# Patient Record
Sex: Female | Born: 1960 | Race: Black or African American | Hispanic: No | Marital: Single | Smoking: Never smoker
Health system: Southern US, Community
[De-identification: ages and names within clinical notes are randomized; demographics above are authoritative.]

## PROBLEM LIST (undated history)

## (undated) DIAGNOSIS — J45909 Unspecified asthma, uncomplicated: Secondary | ICD-10-CM

## (undated) DIAGNOSIS — E0500 Thyrotoxicosis with diffuse goiter without thyrotoxic crisis or storm: Secondary | ICD-10-CM

## (undated) DIAGNOSIS — I10 Essential (primary) hypertension: Secondary | ICD-10-CM

## (undated) DIAGNOSIS — M1990 Unspecified osteoarthritis, unspecified site: Secondary | ICD-10-CM

## (undated) HISTORY — PX: CARDIAC CATHETERIZATION: SHX172

---

## 1989-01-24 HISTORY — PX: ABDOMINAL HYSTERECTOMY: SHX81

## 2010-01-24 HISTORY — PX: GASTRIC BYPASS: SHX52

## 2011-12-19 DIAGNOSIS — J309 Allergic rhinitis, unspecified: Secondary | ICD-10-CM | POA: Insufficient documentation

## 2012-10-03 DIAGNOSIS — I10 Essential (primary) hypertension: Secondary | ICD-10-CM | POA: Insufficient documentation

## 2019-10-08 DIAGNOSIS — E0500 Thyrotoxicosis with diffuse goiter without thyrotoxic crisis or storm: Secondary | ICD-10-CM | POA: Insufficient documentation

## 2020-01-25 HISTORY — PX: KNEE ARTHROPLASTY: SHX992

## 2020-08-06 DIAGNOSIS — S92353A Displaced fracture of fifth metatarsal bone, unspecified foot, initial encounter for closed fracture: Secondary | ICD-10-CM | POA: Insufficient documentation

## 2021-01-24 DIAGNOSIS — I639 Cerebral infarction, unspecified: Secondary | ICD-10-CM

## 2021-01-24 HISTORY — DX: Cerebral infarction, unspecified: I63.9

## 2021-06-02 DIAGNOSIS — Z8679 Personal history of other diseases of the circulatory system: Secondary | ICD-10-CM | POA: Insufficient documentation

## 2021-06-20 ENCOUNTER — Encounter (HOSPITAL_COMMUNITY): Payer: Self-pay | Admitting: Neurosurgery

## 2021-06-20 ENCOUNTER — Emergency Department (HOSPITAL_COMMUNITY): Payer: BC Managed Care – PPO

## 2021-06-20 ENCOUNTER — Inpatient Hospital Stay (HOSPITAL_COMMUNITY)
Admission: EM | Admit: 2021-06-20 | Discharge: 2021-07-16 | DRG: 020 | Disposition: A | Discharge status: Skilled Nursing Facility | Payer: BC Managed Care – PPO | Attending: Neurosurgery | Admitting: Neurosurgery

## 2021-06-20 ENCOUNTER — Inpatient Hospital Stay (HOSPITAL_COMMUNITY): Payer: BC Managed Care – PPO

## 2021-06-20 ENCOUNTER — Other Ambulatory Visit: Payer: Self-pay

## 2021-06-20 DIAGNOSIS — E870 Hyperosmolality and hypernatremia: Secondary | ICD-10-CM | POA: Diagnosis present

## 2021-06-20 DIAGNOSIS — Z9884 Bariatric surgery status: Secondary | ICD-10-CM

## 2021-06-20 DIAGNOSIS — E876 Hypokalemia: Secondary | ICD-10-CM | POA: Diagnosis not present

## 2021-06-20 DIAGNOSIS — R4701 Aphasia: Secondary | ICD-10-CM | POA: Diagnosis present

## 2021-06-20 DIAGNOSIS — I82432 Acute embolism and thrombosis of left popliteal vein: Secondary | ICD-10-CM | POA: Diagnosis present

## 2021-06-20 DIAGNOSIS — J690 Pneumonitis due to inhalation of food and vomit: Secondary | ICD-10-CM | POA: Diagnosis present

## 2021-06-20 DIAGNOSIS — E669 Obesity, unspecified: Secondary | ICD-10-CM | POA: Diagnosis present

## 2021-06-20 DIAGNOSIS — I671 Cerebral aneurysm, nonruptured: Secondary | ICD-10-CM | POA: Diagnosis present

## 2021-06-20 DIAGNOSIS — I161 Hypertensive emergency: Secondary | ICD-10-CM | POA: Diagnosis present

## 2021-06-20 DIAGNOSIS — R29729 NIHSS score 29: Secondary | ICD-10-CM | POA: Diagnosis not present

## 2021-06-20 DIAGNOSIS — J9600 Acute respiratory failure, unspecified whether with hypoxia or hypercapnia: Secondary | ICD-10-CM | POA: Diagnosis present

## 2021-06-20 DIAGNOSIS — G9340 Encephalopathy, unspecified: Secondary | ICD-10-CM | POA: Diagnosis present

## 2021-06-20 DIAGNOSIS — J9601 Acute respiratory failure with hypoxia: Secondary | ICD-10-CM | POA: Diagnosis present

## 2021-06-20 DIAGNOSIS — I6389 Other cerebral infarction: Secondary | ICD-10-CM | POA: Diagnosis not present

## 2021-06-20 DIAGNOSIS — I82453 Acute embolism and thrombosis of peroneal vein, bilateral: Secondary | ICD-10-CM | POA: Diagnosis present

## 2021-06-20 DIAGNOSIS — I607 Nontraumatic subarachnoid hemorrhage from unspecified intracranial artery: Secondary | ICD-10-CM | POA: Diagnosis not present

## 2021-06-20 DIAGNOSIS — G936 Cerebral edema: Secondary | ICD-10-CM | POA: Diagnosis present

## 2021-06-20 DIAGNOSIS — I6012 Nontraumatic subarachnoid hemorrhage from left middle cerebral artery: Principal | ICD-10-CM | POA: Diagnosis present

## 2021-06-20 DIAGNOSIS — I739 Peripheral vascular disease, unspecified: Secondary | ICD-10-CM | POA: Diagnosis present

## 2021-06-20 DIAGNOSIS — E0590 Thyrotoxicosis, unspecified without thyrotoxic crisis or storm: Secondary | ICD-10-CM | POA: Diagnosis present

## 2021-06-20 DIAGNOSIS — G8191 Hemiplegia, unspecified affecting right dominant side: Secondary | ICD-10-CM | POA: Diagnosis present

## 2021-06-20 DIAGNOSIS — R000 Tachycardia, unspecified: Secondary | ICD-10-CM | POA: Diagnosis not present

## 2021-06-20 DIAGNOSIS — I63512 Cerebral infarction due to unspecified occlusion or stenosis of left middle cerebral artery: Secondary | ICD-10-CM | POA: Diagnosis present

## 2021-06-20 DIAGNOSIS — Z20822 Contact with and (suspected) exposure to covid-19: Secondary | ICD-10-CM | POA: Diagnosis present

## 2021-06-20 DIAGNOSIS — R1310 Dysphagia, unspecified: Secondary | ICD-10-CM | POA: Diagnosis present

## 2021-06-20 DIAGNOSIS — Z923 Personal history of irradiation: Secondary | ICD-10-CM

## 2021-06-20 DIAGNOSIS — I82442 Acute embolism and thrombosis of left tibial vein: Secondary | ICD-10-CM | POA: Diagnosis present

## 2021-06-20 DIAGNOSIS — I10 Essential (primary) hypertension: Secondary | ICD-10-CM | POA: Diagnosis present

## 2021-06-20 DIAGNOSIS — I609 Nontraumatic subarachnoid hemorrhage, unspecified: Secondary | ICD-10-CM | POA: Diagnosis not present

## 2021-06-20 DIAGNOSIS — I82412 Acute embolism and thrombosis of left femoral vein: Secondary | ICD-10-CM | POA: Diagnosis present

## 2021-06-20 DIAGNOSIS — Z781 Physical restraint status: Secondary | ICD-10-CM

## 2021-06-20 DIAGNOSIS — I82403 Acute embolism and thrombosis of unspecified deep veins of lower extremity, bilateral: Secondary | ICD-10-CM

## 2021-06-20 DIAGNOSIS — E785 Hyperlipidemia, unspecified: Secondary | ICD-10-CM | POA: Diagnosis present

## 2021-06-20 DIAGNOSIS — Z8679 Personal history of other diseases of the circulatory system: Secondary | ICD-10-CM

## 2021-06-20 DIAGNOSIS — I71019 Dissection of thoracic aorta, unspecified: Secondary | ICD-10-CM | POA: Diagnosis present

## 2021-06-20 DIAGNOSIS — G40901 Epilepsy, unspecified, not intractable, with status epilepticus: Secondary | ICD-10-CM | POA: Diagnosis present

## 2021-06-20 DIAGNOSIS — R509 Fever, unspecified: Secondary | ICD-10-CM | POA: Diagnosis not present

## 2021-06-20 DIAGNOSIS — I608 Other nontraumatic subarachnoid hemorrhage: Principal | ICD-10-CM

## 2021-06-20 DIAGNOSIS — G935 Compression of brain: Secondary | ICD-10-CM | POA: Diagnosis present

## 2021-06-20 DIAGNOSIS — I7100 Dissection of unspecified site of aorta: Secondary | ICD-10-CM | POA: Diagnosis present

## 2021-06-20 DIAGNOSIS — B370 Candidal stomatitis: Secondary | ICD-10-CM | POA: Diagnosis not present

## 2021-06-20 DIAGNOSIS — Z6839 Body mass index (BMI) 39.0-39.9, adult: Secondary | ICD-10-CM

## 2021-06-20 DIAGNOSIS — J45909 Unspecified asthma, uncomplicated: Secondary | ICD-10-CM | POA: Diagnosis present

## 2021-06-20 DIAGNOSIS — S066XAA Traumatic subarachnoid hemorrhage with loss of consciousness status unknown, initial encounter: Secondary | ICD-10-CM | POA: Insufficient documentation

## 2021-06-20 LAB — DIFFERENTIAL
Abs Immature Granulocytes: 0.06 10*3/uL (ref 0.00–0.07)
Basophils Absolute: 0 10*3/uL (ref 0.0–0.1)
Basophils Relative: 0 %
Eosinophils Absolute: 0.3 10*3/uL (ref 0.0–0.5)
Eosinophils Relative: 3 %
Immature Granulocytes: 1 %
Lymphocytes Relative: 32 %
Lymphs Abs: 3.6 10*3/uL (ref 0.7–4.0)
Monocytes Absolute: 1 10*3/uL (ref 0.1–1.0)
Monocytes Relative: 9 %
Neutro Abs: 6.2 10*3/uL (ref 1.7–7.7)
Neutrophils Relative %: 55 %

## 2021-06-20 LAB — I-STAT ARTERIAL BLOOD GAS, ED
Acid-base deficit: 2 mmol/L (ref 0.0–2.0)
Bicarbonate: 24 mmol/L (ref 20.0–28.0)
Calcium, Ion: 1.22 mmol/L (ref 1.15–1.40)
HCT: 34 % — ABNORMAL LOW (ref 36.0–46.0)
Hemoglobin: 11.6 g/dL — ABNORMAL LOW (ref 12.0–15.0)
O2 Saturation: 99 %
Patient temperature: 96.8
Potassium: 2.8 mmol/L — ABNORMAL LOW (ref 3.5–5.1)
Sodium: 146 mmol/L — ABNORMAL HIGH (ref 135–145)
TCO2: 25 mmol/L (ref 22–32)
pCO2 arterial: 43.6 mmHg (ref 32–48)
pH, Arterial: 7.344 — ABNORMAL LOW (ref 7.35–7.45)
pO2, Arterial: 141 mmHg — ABNORMAL HIGH (ref 83–108)

## 2021-06-20 LAB — COMPREHENSIVE METABOLIC PANEL
ALT: 15 U/L (ref 0–44)
AST: 24 U/L (ref 15–41)
Albumin: 3.2 g/dL — ABNORMAL LOW (ref 3.5–5.0)
Alkaline Phosphatase: 94 U/L (ref 38–126)
Anion gap: 10 (ref 5–15)
BUN: 17 mg/dL (ref 6–20)
CO2: 22 mmol/L (ref 22–32)
Calcium: 9.1 mg/dL (ref 8.9–10.3)
Chloride: 109 mmol/L (ref 98–111)
Creatinine, Ser: 0.82 mg/dL (ref 0.44–1.00)
GFR, Estimated: >60 mL/min (ref >60)
Glucose, Bld: 189 mg/dL — ABNORMAL HIGH (ref 70–99)
Potassium: 4.1 mmol/L (ref 3.5–5.1)
Sodium: 141 mmol/L (ref 135–145)
Total Bilirubin: 0.3 mg/dL (ref 0.3–1.2)
Total Protein: 6.3 g/dL — ABNORMAL LOW (ref 6.5–8.1)

## 2021-06-20 LAB — I-STAT BETA HCG BLOOD, ED (MC, WL, AP ONLY): I-stat hCG, quantitative: <5 m[IU]/mL (ref <5)

## 2021-06-20 LAB — SODIUM
Sodium: 142 mmol/L (ref 135–145)
Sodium: 144 mmol/L (ref 135–145)

## 2021-06-20 LAB — RAPID URINE DRUG SCREEN, HOSP PERFORMED
Amphetamines: NOT DETECTED
Barbiturates: NOT DETECTED
Benzodiazepines: NOT DETECTED
Cocaine: NOT DETECTED
Opiates: NOT DETECTED
Tetrahydrocannabinol: NOT DETECTED

## 2021-06-20 LAB — CBC
HCT: 36.9 % (ref 36.0–46.0)
Hemoglobin: 11.6 g/dL — ABNORMAL LOW (ref 12.0–15.0)
MCH: 28.7 pg (ref 26.0–34.0)
MCHC: 31.4 g/dL (ref 30.0–36.0)
MCV: 91.3 fL (ref 80.0–100.0)
Platelets: 256 10*3/uL (ref 150–400)
RBC: 4.04 MIL/uL (ref 3.87–5.11)
RDW: 12.9 % (ref 11.5–15.5)
WBC: 11.1 10*3/uL — ABNORMAL HIGH (ref 4.0–10.5)
nRBC: 0 % (ref 0.0–0.2)

## 2021-06-20 LAB — APTT
aPTT: 24 seconds (ref 24–36)
aPTT: 26 seconds (ref 24–36)

## 2021-06-20 LAB — PROTIME-INR
INR: 1.2 (ref 0.8–1.2)
INR: 1.3 — ABNORMAL HIGH (ref 0.8–1.2)
Prothrombin Time: 15 seconds (ref 11.4–15.2)
Prothrombin Time: 15.6 seconds — ABNORMAL HIGH (ref 11.4–15.2)

## 2021-06-20 LAB — I-STAT CHEM 8, ED
BUN: 19 mg/dL (ref 6–20)
Calcium, Ion: 1.16 mmol/L (ref 1.15–1.40)
Chloride: 106 mmol/L (ref 98–111)
Creatinine, Ser: 0.7 mg/dL (ref 0.44–1.00)
Glucose, Bld: 184 mg/dL — ABNORMAL HIGH (ref 70–99)
HCT: 37 % (ref 36.0–46.0)
Hemoglobin: 12.6 g/dL (ref 12.0–15.0)
Potassium: 3.9 mmol/L (ref 3.5–5.1)
Sodium: 141 mmol/L (ref 135–145)
TCO2: 24 mmol/L (ref 22–32)

## 2021-06-20 LAB — MAGNESIUM: Magnesium: 1.6 mg/dL — ABNORMAL LOW (ref 1.7–2.4)

## 2021-06-20 LAB — CBG MONITORING, ED: Glucose-Capillary: 162 mg/dL — ABNORMAL HIGH (ref 70–99)

## 2021-06-20 LAB — LACTIC ACID, PLASMA
Lactic Acid, Venous: 2.5 mmol/L (ref 0.5–1.9)
Lactic Acid, Venous: 2.4 mmol/L (ref 0.5–1.9)

## 2021-06-20 LAB — SARS CORONAVIRUS 2 BY RT PCR: SARS Coronavirus 2 by RT PCR: NEGATIVE

## 2021-06-20 LAB — MRSA NEXT GEN BY PCR, NASAL: MRSA by PCR Next Gen: NOT DETECTED

## 2021-06-20 MED ORDER — MAGNESIUM SULFATE 2 GM/50ML IV SOLN
2.0000 g | Freq: Once | INTRAVENOUS | Status: AC
  Administered 2021-06-20: 2 g via INTRAVENOUS
  Filled 2021-06-20: qty 50

## 2021-06-20 MED ORDER — ACETAMINOPHEN 160 MG/5ML PO SOLN
650.0000 mg | ORAL | Status: DC | PRN
  Administered 2021-06-21 – 2021-06-29 (×23): 650 mg
  Filled 2021-06-20 (×22): qty 20.3

## 2021-06-20 MED ORDER — CHLORHEXIDINE GLUCONATE CLOTH 2 % EX PADS
6.0000 | MEDICATED_PAD | Freq: Every day | CUTANEOUS | Status: DC
  Administered 2021-06-23 – 2021-07-06 (×14): 6 via TOPICAL

## 2021-06-20 MED ORDER — ONDANSETRON HCL 4 MG/2ML IJ SOLN
4.0000 mg | Freq: Four times a day (QID) | INTRAMUSCULAR | Status: DC | PRN
Start: 2021-06-20 — End: 2021-07-16

## 2021-06-20 MED ORDER — NIMODIPINE 6 MG/ML PO SOLN
60.0000 mg | ORAL | Status: AC
  Administered 2021-06-20 – 2021-07-11 (×113): 60 mg
  Filled 2021-06-20 (×126): qty 10

## 2021-06-20 MED ORDER — POLYETHYLENE GLYCOL 3350 17 G PO PACK: 17.0000 g | PACK | Freq: Every day | ORAL | Status: DC | PRN

## 2021-06-20 MED ORDER — PANTOPRAZOLE SODIUM 40 MG IV SOLR
40.0000 mg | Freq: Every day | INTRAVENOUS | Status: DC
  Administered 2021-06-20: 40 mg via INTRAVENOUS
  Filled 2021-06-20: qty 10

## 2021-06-20 MED ORDER — POLYETHYLENE GLYCOL 3350 17 G PO PACK
17.0000 g | PACK | Freq: Every day | ORAL | Status: DC
  Administered 2021-06-22: 17 g
  Filled 2021-06-20: qty 1

## 2021-06-20 MED ORDER — SUCCINYLCHOLINE CHLORIDE 20 MG/ML IJ SOLN
INTRAMUSCULAR | Status: AC | PRN
  Administered 2021-06-20: 150 mg via INTRAVENOUS

## 2021-06-20 MED ORDER — SODIUM CHLORIDE 3 % IV SOLN
INTRAVENOUS | Status: DC
  Filled 2021-06-20 (×5): qty 500

## 2021-06-20 MED ORDER — SODIUM CHLORIDE 3 % IV BOLUS
500.0000 mL | Freq: Once | INTRAVENOUS | Status: AC
  Administered 2021-06-20: 500 mL via INTRAVENOUS
  Filled 2021-06-20: qty 500

## 2021-06-20 MED ORDER — FENTANYL CITRATE PF 50 MCG/ML IJ SOSY
50.0000 ug | PREFILLED_SYRINGE | INTRAMUSCULAR | Status: DC | PRN
  Administered 2021-06-21 – 2021-06-22 (×2): 50 ug via INTRAVENOUS
  Filled 2021-06-20 (×2): qty 1

## 2021-06-20 MED ORDER — LEVETIRACETAM IN NACL 1000 MG/100ML IV SOLN
1000.0000 mg | Freq: Once | INTRAVENOUS | Status: AC
  Administered 2021-06-20: 1000 mg via INTRAVENOUS

## 2021-06-20 MED ORDER — FENTANYL CITRATE PF 50 MCG/ML IJ SOSY: 50.0000 ug | PREFILLED_SYRINGE | INTRAMUSCULAR | Status: DC | PRN

## 2021-06-20 MED ORDER — HYDROMORPHONE HCL 1 MG/ML IJ SOLN: 0.5000 mg | INTRAMUSCULAR | Status: DC | PRN

## 2021-06-20 MED ORDER — ETOMIDATE 2 MG/ML IV SOLN
INTRAVENOUS | Status: AC | PRN
  Administered 2021-06-20: 20 mg via INTRAVENOUS

## 2021-06-20 MED ORDER — DOCUSATE SODIUM 100 MG PO CAPS: 100.0000 mg | ORAL_CAPSULE | Freq: Two times a day (BID) | ORAL | Status: DC | PRN

## 2021-06-20 MED ORDER — LEVETIRACETAM IN NACL 500 MG/100ML IV SOLN
500.0000 mg | Freq: Two times a day (BID) | INTRAVENOUS | Status: DC
  Administered 2021-06-20 – 2021-06-28 (×16): 500 mg via INTRAVENOUS
  Filled 2021-06-20 (×16): qty 100

## 2021-06-20 MED ORDER — ONDANSETRON 4 MG PO TBDP: 4.0000 mg | ORAL_TABLET | Freq: Four times a day (QID) | ORAL | Status: DC | PRN

## 2021-06-20 MED ORDER — ACETAMINOPHEN 650 MG RE SUPP: 650.0000 mg | RECTAL | Status: DC | PRN

## 2021-06-20 MED ORDER — DOCUSATE SODIUM 50 MG/5ML PO LIQD
100.0000 mg | Freq: Two times a day (BID) | ORAL | Status: DC
  Administered 2021-06-20 – 2021-06-22 (×3): 100 mg
  Filled 2021-06-20 (×3): qty 10

## 2021-06-20 MED ORDER — SODIUM CHLORIDE 0.9 % IV SOLN
4000.0000 mg | Freq: Once | INTRAVENOUS | Status: DC
  Filled 2021-06-20: qty 40

## 2021-06-20 MED ORDER — IOHEXOL 350 MG/ML SOLN
100.0000 mL | Freq: Once | INTRAVENOUS | Status: AC | PRN
  Administered 2021-06-20: 75 mL via INTRAVENOUS

## 2021-06-20 MED ORDER — NIMODIPINE 30 MG PO CAPS
60.0000 mg | ORAL_CAPSULE | ORAL | Status: AC
  Administered 2021-07-09 – 2021-07-11 (×9): 60 mg via ORAL
  Filled 2021-06-20 (×20): qty 2

## 2021-06-20 MED ORDER — PANTOPRAZOLE SODIUM 40 MG PO TBEC: 40.0000 mg | DELAYED_RELEASE_TABLET | Freq: Every day | ORAL | Status: DC

## 2021-06-20 MED ORDER — PANTOPRAZOLE 2 MG/ML SUSPENSION: 40.0000 mg | Freq: Every day | ORAL | Status: DC

## 2021-06-20 MED ORDER — SODIUM CHLORIDE 0.9% FLUSH
3.0000 mL | Freq: Once | INTRAVENOUS | Status: AC
  Administered 2021-06-20: 3 mL via INTRAVENOUS

## 2021-06-20 MED ORDER — PROPOFOL 1000 MG/100ML IV EMUL
5.0000 ug/kg/min | INTRAVENOUS | Status: DC
  Administered 2021-06-20: 10 ug/kg/min via INTRAVENOUS

## 2021-06-20 MED ORDER — ACETAMINOPHEN 325 MG PO TABS
650.0000 mg | ORAL_TABLET | ORAL | Status: DC | PRN
  Administered 2021-06-26: 650 mg via ORAL
  Filled 2021-06-20: qty 2

## 2021-06-20 MED ORDER — LABETALOL HCL 5 MG/ML IV SOLN: 20.0000 mg | Freq: Once | INTRAVENOUS | Status: DC

## 2021-06-20 MED ORDER — SODIUM CHLORIDE 0.9 % IV BOLUS
1000.0000 mL | Freq: Once | INTRAVENOUS | Status: AC
  Administered 2021-06-20: 1000 mL via INTRAVENOUS

## 2021-06-20 MED ORDER — SODIUM CHLORIDE 0.9 % IV SOLN: INTRAVENOUS | Status: DC

## 2021-06-20 MED ORDER — CLEVIDIPINE BUTYRATE 0.5 MG/ML IV EMUL: 0.0000 mg/h | INTRAVENOUS | Status: DC

## 2021-06-20 MED ORDER — SODIUM CHLORIDE 0.9 % IV SOLN
3.0000 g | Freq: Four times a day (QID) | INTRAVENOUS | Status: AC
  Administered 2021-06-20 – 2021-06-25 (×20): 3 g via INTRAVENOUS
  Filled 2021-06-20 (×22): qty 8

## 2021-06-20 MED ORDER — MORPHINE SULFATE (PF) 2 MG/ML IV SOLN: 0.5000 mg | INTRAVENOUS | Status: DC | PRN

## 2021-06-20 MED ORDER — ALBUTEROL SULFATE (2.5 MG/3ML) 0.083% IN NEBU: 2.5000 mg | INHALATION_SOLUTION | Freq: Four times a day (QID) | RESPIRATORY_TRACT | Status: DC | PRN

## 2021-06-20 MED ORDER — PROPOFOL 1000 MG/100ML IV EMUL
0.0000 ug/kg/min | INTRAVENOUS | Status: DC
  Administered 2021-06-20 – 2021-06-21 (×2): 30 ug/kg/min via INTRAVENOUS
  Administered 2021-06-21: 10 ug/kg/min via INTRAVENOUS
  Administered 2021-06-22: 20 ug/kg/min via INTRAVENOUS
  Administered 2021-06-22: 25 ug/kg/min via INTRAVENOUS
  Filled 2021-06-20 (×5): qty 100

## 2021-06-20 MED ORDER — CLEVIDIPINE BUTYRATE 0.5 MG/ML IV EMUL
0.0000 mg/h | INTRAVENOUS | Status: DC
  Administered 2021-06-20: 2 mg/h via INTRAVENOUS

## 2021-06-20 MED ORDER — STROKE: EARLY STAGES OF RECOVERY BOOK
Freq: Once | Status: AC
  Filled 2021-06-20: qty 1

## 2021-06-20 MED ORDER — HEPARIN SODIUM (PORCINE) 5000 UNIT/ML IJ SOLN: 5000.0000 [IU] | Freq: Three times a day (TID) | INTRAMUSCULAR | Status: DC

## 2021-06-20 MED ORDER — PANTOPRAZOLE 2 MG/ML SUSPENSION
40.0000 mg | Freq: Every day | ORAL | Status: DC
  Administered 2021-06-20 – 2021-06-28 (×9): 40 mg
  Filled 2021-06-20 (×9): qty 20

## 2021-06-20 NOTE — Progress Notes (Signed)
Received pt from the ED, intubated, accompanied by daughter, no personal belongings at bedside, all questions answered t  06/20/21 2130  Vitals  BP (!) 121/96  MAP (mmHg) 105  ECG Heart Rate 75  Resp 11  MEWS Score  MEWS Temp 0  MEWS Systolic 0  MEWS Pulse 0  MEWS RR 1  MEWS LOC 3  MEWS Score 4  MEWS Score Color Red   o the daughter

## 2021-06-20 NOTE — Progress Notes (Signed)
RT pulled back et tube 2.5 cm to 21 cm @ lip. Equal BLBS and return volumes on vent. Pulled back per MD request and cxr results.

## 2021-06-20 NOTE — Code Documentation (Signed)
Stroke Response Nurse Documentation Code Documentation  Yolanda Boone is a 61 y.o. female arriving to [facility]  via [facility] on 06/20/21 with past medical hx of HTN, graves, type b aneurysm, asthma. On No antithrombotic. Code stroke was activated by EMS.   Patient from home where she was LKW at 1414 and now complaining of unresponsive, seizing, and posturing .   Stroke team at the bedside on patient arrival. Labs drawn and patient cleared for CT by Dr. Regenia Skeeter. Patient to CT with team. NIHSS 31, see documentation for details and code stroke times. The following imaging was completed:  CT Head and CTA. Patient is not a candidate for IV Thrombolytic due to head bleed. Patient started on hypertonic IVF, Keppra, and cleviprex for BP management  Bedside handoff with ED RN Curt Bears.    Prentiss  Rapid Response RN

## 2021-06-20 NOTE — Consult Note (Signed)
Neurology Consult H&P  Nandika Stetzer MR# [id number] 06/20/2021   CC: Code stroke  History is obtained from: EMS and chart.  HPI: Yolanda Boone is a 61 y.o. female PMHx as reviewed below found down by family approximately 1615.  Patient was unresponsive between the nightstand in the bed EMS arrived and noted patient had left gaze deviation as well as right arm flexion and left arm extension on arrival GCS 3.    Blood pressure was last 160/130   LKW: Unknown tNK given: No not indicated IR Thrombectomy No, not indicated Modified Rankin Scale: 0-Completely asymptomatic and back to baseline post- stroke NIHSS: 29  ROS: Unable to assess due to encephalopathy, intubated with paralytic.  No past medical history on file.   No family history on file.  Social History:  has no history on file for tobacco use, alcohol use, and drug use.   Prior to Admission medications   Not on File    Exam: Current vital signs: BP (!) 128/94   Pulse (!) 101   Temp (!) 95.7 F (35.4 C) (Temporal)   Resp (!) 24   Ht 5\' 7"  (1.702 m)   Wt 95 kg   SpO2 100%   BMI 32.80 kg/m   Physical Exam  Constitutional: Appears well-developed and well-nourished.  Psych: Unable to assess due to body encephalopathy, intubation with paralytic agent Eyes: No scleral injection HENT: No OP obstruction. Head: Normocephalic.  Cardiovascular: Normal rate and regular rhythm.  Respiratory: Effort normal, symmetric excursions bilaterally, no audible wheezing. GI: Soft.  No distension. There is no tenderness.  Skin: WDI  Neuro: Mental Status: Unable to assess as the patient was intubated and paralyzed with paralytic agent.  I have reviewed labs in epic and the pertinent results are: CBG 162  GCS 3 ICH 2 52mm rightward shift   I have reviewed the images obtained: NCT head showed Large volume of acute subarachnoid hemorrhage, described above and greatest in the left sylvian fissure with possible rounded  filling defect in the region of the left MCA bifurcation. Recommend emergent CTA to assess for ruptured aneurysm (possibly left MCA bifurcation) resulting mass effect with approximately 4 mm of rightward midline shift. CTA head and neck showed Small/diminutive M1 MCAs and ACAs bilaterally, which are patent. irregular/lobulated 5 x 2 mm outpouching arising from the left MCA bifurcation, compatible with ruptured aneurysm. 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   *Possible outpouching of the imaged distal aortic arch and proximal descending aorta with linear filling defect. This is indeterminate and incompletely imaged, but may represent chronic, fenestrated dissection or aneurysm at the origin of an aberrant subclavian artery. Recommend follow-up CTA of the chest to further evaluate.    Assessment: Yolanda Boone is a 61 y.o. female unclear medical history as she has not received much of her care at [facility] health arrives in status epilepticus found to have ruptured aneurysm at the left M1 bifurcation large volume subarachnoid hemorrhage.   Impression:  Unruptured left M1 aneurysm Subarachnoid hemorrhage Hypertensive emergency Status epilepticus Comatose  Plan: Please consult neurosurgery for admission and further management. Stat labs 100 cc bolus of 3% NaCl Stat 75 cc/h NaCl infusion Elevate head of bed 30 to 45 degrees Start Cleviprex drip with as needed labetalol 10 mg to maintain SBP between 130 and 150. 4.5 g Keppra loading dose IV. 1500 mg Keppra twice daily maintenance dose. Consider nimodipine 60 mg OG tube every 4 hours for 21 days.  This patient is critically ill and at significant risk of neurological worsening, death and care requires constant monitoring of vital signs, hemodynamics,respiratory and cardiac monitoring, neurological assessment, discussion with family, other specialists and medical decision making of high complexity. I spent  130 minutes of neurocritical care time  in the care of  this patient. This was time spent independent of any time provided by nurse practitioner or PA.  Electronically signed by:  Marisue Humble, MD Page: [phone] 06/20/2021, 7:18 PM  If 7pm- 7am, please page neurology on call as listed in AMION.

## 2021-06-20 NOTE — Progress Notes (Signed)
eLink Physician-Brief Progress Note Patient Name: Yolanda Boone DOB: 12/21/1960 MRN: [id number]   Date of Service  06/20/2021  HPI/Events of Note  60/F with large SAH with midline shift, left MCA with possible ruptured aneurysm, left ICA aneurysm, hypertensive emergency, status epilepticus, respiratory failure.  In the ED, there was improvement in neurologic status with GCS 8.   eICU Interventions  Continue hypertonic saline @ 75cc/hr.  Continue to follow Na with goal 150-155.   NS discontinued.  Continue Keppra. Continue Unasyn empirically. Check procalcitonin. Plan is for cerebral angiogram in the morning.  Keep SBP 130-150.  Titrate cleviprex. Wrist restraints ordered.  Foley cath placement ordered.  SCDs for DVT prophylaxis.  Pantoprazole for GI prophylaxis.      Intervention Category Evaluation Type: New Patient Evaluation  Elsie Lincoln 06/20/2021, 10:45 PM

## 2021-06-20 NOTE — H&P (Cosign Needed)
Chief Complaint: SAH  HPI: Yolanda Boone is a 61 y.o. female who was BIB EMS after being found down by her family earlier today. LKW 1615 hrs. She was reported to be in her usual state of health prior to being found down. No precipitating events were reported. Per EMS, she initially had a left-sided gaze and was posturing. En route patient with 02 sats in the 60s which necessitated the patient being ventilated with a bag mask. On arrival to the ED, a code stroke was activated. The patient was reported to have a GCS of 3 which required emergent intubation. CTH and CTA were obtained and revealed a large SAH with approximately 4 mm of rightward MLS which was concerning for left MCA aneurysm rupture.   History reviewed. No pertinent past medical history.  No family history on file. Social History:  has no history on file for tobacco use, alcohol use, and drug use.  Allergies: No Known Allergies  (Not in a hospital admission)   Results for orders placed or performed during the hospital encounter of 06/20/21 (from the past 48 hour(s))  CBG monitoring, ED     Status: Abnormal   Collection Time: 06/20/21  5:58 PM  Result Value Ref Range   Glucose-Capillary 162 (H) 70 - 99 mg/dL    Comment: Glucose reference range applies only to samples taken after fasting for at least 8 hours.  I-stat chem 8, ED     Status: Abnormal   Collection Time: 06/20/21  6:00 PM  Result Value Ref Range   Sodium 141 135 - 145 mmol/L   Potassium 3.9 3.5 - 5.1 mmol/L   Chloride 106 98 - 111 mmol/L   BUN 19 6 - 20 mg/dL   Creatinine, Ser 5.53 0.44 - 1.00 mg/dL   Glucose, Bld 748 (H) 70 - 99 mg/dL    Comment: Glucose reference range applies only to samples taken after fasting for at least 8 hours.   Calcium, Ion 1.16 1.15 - 1.40 mmol/L   TCO2 24 22 - 32 mmol/L   Hemoglobin 12.6 12.0 - 15.0 g/dL   HCT 27.0 78.6 - 75.4 %  Protime-INR     Status: None   Collection Time: 06/20/21  6:02 PM  Result Value Ref Range    Prothrombin Time 15.0 11.4 - 15.2 seconds   INR 1.2 0.8 - 1.2    Comment: (NOTE) INR goal varies based on device and disease states. Performed at [facility], [address] [place], [place] [address]   APTT     Status: None   Collection Time: 06/20/21  6:02 PM  Result Value Ref Range   aPTT 24 24 - 36 seconds    Comment: Performed at [facility], [address], [place], [place] [address]  CBC     Status: Abnormal   Collection Time: 06/20/21  6:02 PM  Result Value Ref Range   WBC 11.1 (H) 4.0 - 10.5 K/uL   RBC 4.04 3.87 - 5.11 MIL/uL   Hemoglobin 11.6 (L) 12.0 - 15.0 g/dL   HCT 19.7 58.8 - 32.5 %   MCV 91.3 80.0 - 100.0 fL   MCH 28.7 26.0 - 34.0 pg   MCHC 31.4 30.0 - 36.0 g/dL   RDW 49.8 26.4 - 15.8 %   Platelets 256 150 - 400 K/uL   nRBC 0.0 0.0 - 0.2 %    Comment: Performed at [facility], [address] [place], [place] [address]  Differential     Status: None   Collection Time: 06/20/21  6:02 PM  Result Value Ref Range   Neutrophils Relative % 55 %   Neutro Abs 6.2 1.7 - 7.7 K/uL   Lymphocytes Relative 32 %   Lymphs Abs 3.6 0.7 - 4.0 K/uL   Monocytes Relative 9 %   Monocytes Absolute 1.0 0.1 - 1.0 K/uL   Eosinophils Relative 3 %   Eosinophils Absolute 0.3 0.0 - 0.5 K/uL   Basophils Relative 0 %   Basophils Absolute 0.0 0.0 - 0.1 K/uL   Immature Granulocytes 1 %   Abs Immature Granulocytes 0.06 0.00 - 0.07 K/uL    Comment: Performed at [facility], [address], [place], [place] [address]  Comprehensive metabolic panel     Status: Abnormal   Collection Time: 06/20/21  6:02 PM  Result Value Ref Range   Sodium 141 135 - 145 mmol/L   Potassium 4.1 3.5 - 5.1 mmol/L   Chloride 109 98 - 111 mmol/L   CO2 22 22 - 32 mmol/L   Glucose, Bld 189 (H) 70 - 99 mg/dL    Comment: Glucose reference range applies only to samples taken after fasting for at least 8 hours.   BUN 17 6 - 20 mg/dL   Creatinine, Ser 6.04 0.44 - 1.00 mg/dL   Calcium 9.1  8.9 - 54.0 mg/dL   Total Protein 6.3 (L) 6.5 - 8.1 g/dL   Albumin 3.2 (L) 3.5 - 5.0 g/dL   AST 24 15 - 41 U/L   ALT 15 0 - 44 U/L   Alkaline Phosphatase 94 38 - 126 U/L   Total Bilirubin 0.3 0.3 - 1.2 mg/dL   GFR, Estimated >98 >11 mL/min    Comment: (NOTE) Calculated using the CKD-EPI Creatinine Equation (2021)    Anion gap 10 5 - 15    Comment: Performed at [facility], [address], [place], [place] [address]  I-Stat beta hCG blood, ED     Status: None   Collection Time: 06/20/21  6:02 PM  Result Value Ref Range   I-stat hCG, quantitative <5.0 <5 mIU/mL   Comment 3            Comment:   GEST. AGE      CONC.  (mIU/mL)   <=1 WEEK        5 - 50     2 WEEKS       50 - 500     3 WEEKS       100 - 10,000     4 WEEKS     1,000 - 30,000        FEMALE AND NON-PREGNANT FEMALE:     LESS THAN 5 mIU/mL   Lactic acid, plasma     Status: Abnormal   Collection Time: 06/20/21  6:02 PM  Result Value Ref Range   Lactic Acid, Venous 2.5 (HH) 0.5 - 1.9 mmol/L    Comment: CRITICAL RESULT CALLED TO, READ BACK BY AND VERIFIED WITH: GRACE TATE RN.@[email or web address]  ON 5.28.23 BY TCALDWELL MT. Performed at [facility], [address], [place], [place] [address]   Magnesium     Status: Abnormal   Collection Time: 06/20/21  6:04 PM  Result Value Ref Range   Magnesium 1.6 (L) 1.7 - 2.4 mg/dL    Comment: Performed at [facility], [address] [place], [place] [address]  Sodium     Status: None  Collection Time: 06/20/21  6:11 PM  Result Value Ref Range   Sodium 142 135 - 145 mmol/L    Comment: Performed at [facility], [address] [place], [place] [address]  SARS Coronavirus 2 by RT PCR (hospital order, performed in [facility] hospital lab) *cepheid single result test* Anterior Nasal Swab     Status: None   Collection Time: 06/20/21  6:29 PM   Specimen: Anterior Nasal Swab  Result Value Ref Range   SARS Coronavirus 2 by RT PCR NEGATIVE NEGATIVE    Comment:  (NOTE) SARS-CoV-2 target nucleic acids are NOT DETECTED.  The SARS-CoV-2 RNA is generally detectable in upper and lower respiratory specimens during the acute phase of infection. The lowest concentration of SARS-CoV-2 viral copies this assay can detect is 250 copies / mL. A negative result does not preclude SARS-CoV-2 infection and should not be used as the sole basis for treatment or other patient management decisions.  A negative result may occur with improper specimen collection / handling, submission of specimen other than nasopharyngeal swab, presence of viral mutation(s) within the areas targeted by this assay, and inadequate number of viral copies (<250 copies / mL). A negative result must be combined with clinical observations, patient history, and epidemiological information.  Fact Sheet for Patients:   RoadLapTop.co.zahttps://www.fda.gov/media/158405/download  Fact Sheet for Healthcare Providers: http://kim-miller.com/https://www.fda.gov/media/158404/download  This test is not yet approved or  cleared by the [place] FDA and has been authorized for detection and/or diagnosis of SARS-CoV-2 by FDA under an Emergency Use Authorization (EUA).  This EUA will remain in effect (meaning this test can be used) for the duration of the COVID-19 declaration under Section 564(b)(1) of the Act, 21 U.S.C. section 360bbb-3(b)(1), unless the authorization is terminated or revoked sooner.  Performed at [facility], [address] [place], [place] [address]   I-Stat arterial blood gas, ED     Status: Abnormal   Collection Time: 06/20/21  7:22 PM  Result Value Ref Range   pH, Arterial 7.344 (L) 7.35 - 7.45   pCO2 arterial 43.6 32 - 48 mmHg   pO2, Arterial 141 (H) 83 - 108 mmHg   Bicarbonate 24.0 20.0 - 28.0 mmol/L   TCO2 25 22 - 32 mmol/L   O2 Saturation 99 %   Acid-base deficit 2.0 0.0 - 2.0 mmol/L   Sodium 146 (H) 135 - 145 mmol/L   Potassium 2.8 (L) 3.5 - 5.1 mmol/L   Calcium, Ion 1.22 1.15 - 1.40 mmol/L    HCT 34.0 (L) 36.0 - 46.0 %   Hemoglobin 11.6 (L) 12.0 - 15.0 g/dL   Patient temperature 10.296.8 F    Collection site RADIAL, ALLEN'S TEST ACCEPTABLE    Drawn by Operator    Sample type ARTERIAL   Lactic acid, plasma     Status: Abnormal   Collection Time: 06/20/21  8:39 PM  Result Value Ref Range   Lactic Acid, Venous 2.4 (HH) 0.5 - 1.9 mmol/L    Comment: CRITICAL VALUE NOTED.  VALUE IS CONSISTENT WITH PREVIOUSLY REPORTED AND CALLED VALUE. Performed at [facility], [address] [place], [place] [address]    DG Chest Portable 1 View  Result Date: 06/20/2021 CLINICAL DATA:  Intubation EXAM: PORTABLE CHEST 1 VIEW COMPARISON:  None Available. FINDINGS: Endotracheal tube at the carina.  Withdrawal 3 cm is suggested. Possible mild central left upper lobe opacity, equivocal. This could reflect mild aspiration. Lungs are otherwise clear No pleural effusion or  pneumothorax. The heart is normal in size. IMPRESSION: Endotracheal tube at the carina. Withdrawal 3 cm is suggested. Possible mild central left upper lobe opacity, equivocal. This could reflect mild aspiration. Electronically Signed   By: Charline Bills M.D.   On: 06/20/2021 18:48   DG Abd Portable 1 View  Result Date: 06/20/2021 CLINICAL DATA:  OG tube placement. EXAM: PORTABLE ABDOMEN - 1 VIEW COMPARISON:  None Available. FINDINGS: Orogastric tube tip is in the proximal stomach with side hole at the level of the distal esophagus. No dilated bowel loops in the upper abdomen. IMPRESSION: 1. Orogastric tube tip in the proximal stomach with side hole at the level of the distal esophagus. Recommend advancing tube 7 cm. Electronically Signed   By: Darliss Cheney M.D.   On: 06/20/2021 20:45   CT HEAD CODE STROKE WO CONTRAST  Result Date: 06/20/2021 CLINICAL DATA:  Code stroke.  Neuro deficit, acute, stroke suspected EXAM: CT HEAD WITHOUT CONTRAST TECHNIQUE: Contiguous axial images were obtained from the base of the skull through the  vertex without intravenous contrast. RADIATION DOSE REDUCTION: This exam was performed according to the departmental dose-optimization program which includes automated exposure control, adjustment of the mA and/or kV according to patient size and/or use of iterative reconstruction technique. COMPARISON:  None Available. FINDINGS: Brain: Large volume subarachnoid hemorrhage, greatest within the left sylvian fissure. Hemorrhage also extends into the suprasellar cistern, basal cisterns, right sylvian fissure and along the falx. Hemorrhage results in effacement of the left lateral ventricle and approximately 4 mm of rightward midline shift. No evidence of acute large vascular territory infarct, mass lesion, or hydrocephalus. Vascular: Nondiagnostic evaluation for hyperdense vessel due to the hemorrhage. Filling possible defect in the region of the left MCA bifurcation. Skull: No acute fracture. Sinuses/Orbits: Clear visualized sinuses. No acute orbital findings. IMPRESSION: 1. Large volume of acute subarachnoid hemorrhage, described above and greatest in the left sylvian fissure with possible rounded filling defect in the region of the left MCA bifurcation. Recommend emergent CTA to assess for ruptured aneurysm (possibly left MCA bifurcation). 2. Resulting mass effect with approximately 4 mm of rightward midline shift. Findings and recommendations discussed with Dr. Thomasena Edis via telephone at 6:18 PM. Electronically Signed   By: Feliberto Harts M.D.   On: 06/20/2021 18:21   CT ANGIO HEAD NECK W WO CM (CODE STROKE)  Result Date: 06/20/2021 CLINICAL DATA:  Code stroke. EXAM: CT ANGIOGRAPHY HEAD AND NECK TECHNIQUE: Multidetector CT imaging of the head and neck was performed using the standard protocol during bolus administration of intravenous contrast. Multiplanar CT image reconstructions and MIPs were obtained to evaluate the vascular anatomy. Carotid stenosis measurements (when applicable) are obtained utilizing  NASCET criteria, using the distal internal carotid diameter as the denominator. RADIATION DOSE REDUCTION: This exam was performed according to the departmental dose-optimization program which includes automated exposure control, adjustment of the mA and/or kV according to patient size and/or use of iterative reconstruction technique. CONTRAST:  75mL OMNIPAQUE IOHEXOL 350 MG/ML SOLN COMPARISON:  CT head from the same day. FINDINGS: CTA NECK FINDINGS Aortic arch: Great vessel origins are patent. Aberrant origin of the right subclavian artery. Possible outpouching of the imaged distal aortic arch and proximal descending aorta with linear filling defect. Right carotid system: No evidence of dissection, stenosis (50% or greater) or occlusion. Left carotid system: No evidence of dissection, stenosis (50% or greater) or occlusion. Vertebral arteries: Limited visualization due to streak artifact from pooled contrast. The left vertebral artery is dominant. The  right vertebral artery is small throughout its course. No visible high-grade stenosis. Skeleton: No evidence of acute fracture. Other neck: Mild edema in the lower neck about the thyroid. Upper chest: Patchy opacities in the visualized upper lobes bilaterally. Review of the MIP images confirms the above findings CTA HEAD FINDINGS Anterior circulation: Bilateral intracranial ICAs are patent. Small/diminutive M1 MCAs and ACAs bilaterally, which are patent. Irregular/lobulated 5 x 2 mm outpouching arising from the left MCA bifurcation, compatible with ruptured aneurysm. 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 Posterior circulation: Right/non dominant vertebral artery appears to terminate as PICA. Left vertebral artery and basilar artery are patent. Vertebrobasilar system is small/diminutive with bilateral fetal type PCAs, anatomic variant. Bilateral PCAs are patent and small. Venous sinuses: As permitted by contrast timing, patent. Anatomic  variants: Detailed above. Review of the MIP images confirms the above findings IMPRESSION: 1. Irregular/lobulated 5 x 2 mm outpouching arising from the left MCA bifurcation, compatible with aneurysm that is probably ruptured. Recommend urgent neurointerventional consultation. 2. 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 3. No emergent large vessel occlusion. Many of the intracranial arteries are diminutive, which could be congenital and/or related to vasospasm. 4. Possible outpouching of the imaged distal aortic arch and proximal descending aorta with linear filling defect. This is indeterminate and incompletely imaged, but may represent chronic, fenestrated dissection or aneurysm at the origin of an aberrant subclavian artery. Recommend follow-up CTA of the chest to further evaluate. 5. Mild edema in the lower neck about the thyroid. This is nonspecific but could be traumatic related to recent emergent intubation. A follow-up CT of the neck could further evaluate if clinically warranted. 6. Patchy opacities in the upper lobes bilaterally, possibly aspiration. Consider correlation with dedicated chest imaging. Emergent findings discussed with Dr. Thomasena Edis via telephone at 6:40 p.m. Electronically Signed   By: Feliberto Harts M.D.   On: 06/20/2021 19:00    Review of Systems  Unable to perform ROS: Acuity of condition   Blood pressure (!) 122/97, pulse 79, temperature 98 F (36.7 C), temperature source Core, resp. rate 19, height  (1.702 m), weight 95 kg, SpO2 100 %.  Physical Exam Vitals and nursing note reviewed.  Constitutional:      Appearance: She is well-developed. NAD HEENT:     Head: Normocephalic and atraumatic.  Cardiovascular:     Rate and Rhythm: Regular rhythm. Tachycardia present.     Heart sounds: Normal heart sounds.  Pulmonary:     Breath sounds: Normal breath sounds.     Comments: On vent Abdominal:     General: There is no distension.      Palpations: Abdomen is soft.  Skin:    General: Skin is warm and dry.  Neuro: Patient is intubated and on Propofol gtt. In NAD. She is not alert and is unable to follow commands. No spontaneous eye opening. Minimal eye opening to noxious stimuli. Localizes to noxious stimuli in BUE and BLE. PERRL, 2mm bilaterally. GCS 8   Assessment/Plan 61 y.o. female who was fond down unresponsive earlier today. On arrival to the ED, she was reported to have had a GCS of 3 and was emergently intubated for airway protection. CTH revealed a large SAH resulting in mass effect with approximately 4 mm of rightward midline shift. CTA performed and revealed potential ruptured aneurysm at the left MCA bifurcation and a medially directed right supraclinoid ICA aneurysm. During her time in the ED, she had  a moderately improved neurological exam, now with localization to pain in her BUE and BLE to noxious stimuli with GCS of 8. Plan for cerebral angiogram in the morning.   -Admit to neuro ICU -PCCM consult for vent/med management, appreciate assistance -Frequent neuro checks -SBP 130-150 mm Hg -Keppra  -Cerebral angiogram in the morning with Dr. Conchita Paris    Council Mechanic, DNP, AGNP-C Neurosurgery Nurse Practitioner  [facility] [address], [place], [place] [address] P: [phone]    F: [phone]  06/20/2021 10:01 PM   Pt seen and examined.  Discussed her case with Dr. Conchita Paris who planned for angiogram and possible treatment in morning for ruptured L MCA aneurysm

## 2021-06-20 NOTE — Consult Note (Signed)
NAME:  Yolanda Boone, MRN:  [id number], DOB:  1960/02/27, LOS: 0 ADMISSION DATE:  06/20/2021, CONSULTATION DATE:  5/28 REFERRING MD:  Donavan Burnet FOR CONSULT:  Vent and medical management   History of Present Illness:  Patient is encephalopathic and/or intubated. Therefore history has been obtained from chart review.   Yolanda Boone, is a 61 y.o. female, who presented to the [facility] ED with a chief complaint of being found unresponsive.  Last known normal 1615. Per report was active earlier in day. Found down later in evening. EMS called. Patient hypoxic with sats of 60% during transport.  ED course was notable for left gaze preference and posturing on arrival. Neuro was consulted. They were intubated in the ED. [facility] concerning for large volume subarachnoid hemorrhage, greatest in the left sylvian fissure, mass effect with 4 mm rightward shift.  PCCM was consulted for assistance with ventilator and medical management  Pertinent  Medical History  HTN, Thoracic aneurysm, gastric bypass, history of Graves disease.  Significant Hospital Events: Including procedures, antibiotic start and stop dates in addition to other pertinent events   5/28 present to [facility], PCCM consult, ETT  Interim History / Subjective:  See above  Unable to obtain subjective evaluation due to patient status  Objective   Blood pressure (!) 172/153, pulse (!) 107, temperature (!) 95.7 F (35.4 C), temperature source Temporal, resp. rate (!) 23, height 5\' 7"  (1.702 m), weight 95 kg, SpO2 100 %.    Vent Mode: PRVC FiO2 (%):  [100 %] 100 % Set Rate:  [18 bmp] 18 bmp Vt Set:  [490 mL] 490 mL PEEP:  [5 cmH20] 5 cmH20   Intake/Output Summary (Last 24 hours) at 06/20/2021 1859 Last data filed at 06/20/2021 1835 Gross per 24 hour  Intake 100 ml  Output --  Net 100 ml   Filed Weights   06/20/21 1800  Weight: 95 kg    Examination: General: In bed, NAD, obese HEENT: MM pink/moist, anicteric, atraumatic Neuro:  RASS -3, PERRL 39mm, GCS 7, localizing with uppers, no eye opening, ett, MAE CV: S1S2, NSR, no m/r/g appreciated PULM:  clear in the upper lobes, clear in the lower lobes, trachea midline, chest expansion symmetric GI: soft, bsx4 active, non-tender   Extremities: warm/dry, no pretibial edema, capillary refill less than 3 seconds  Skin:  no rashes or lesions noted  Labs: Glucose 162 WBC 11.1 Mag 1.6 ABG 7.34/43/141/24 Lactic pending  CMP reviewed CT head: Subarachnoid hemorrhage, 4 mm shift CTA head and neck: Per radiology 5 x 2 mm outpouching from the left MCA bifurcation, question aneurysm rupture.  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  No LVO. Twelve-lead: Sinus rhythm, no significant ST changes noted. CXR: no pneumo, effusion, ? LUL infiltrate, ETT pulled back per EDP   Resolved Hospital Problem list     Assessment & Plan:  Subarachnoid hemorrhage with 4 mm midline shift 5 x 2 mm outpouching from the left MCA bifurcation, question aneurysm rupture.  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 -Neurosurgery to admit, taking for procedure. -3% bolus infusing in ED. Monitor NA level -Goal blood pressure 130-160 per neuro at bedside at this time. Revisit in AM/when note placed . Cleviprex ordered. titrate medicine to goal blood pressure -ASA/Statin per primary -Continue neuroprotective measures: normothermia, euglycemia, HOB greater than 30 when able post procedure, head in neutral alignment, normocapnia, normoxia.  -Trend CBC and BMET -Aspirations precautions  -Check UDS. -Follow up lactic  Acute respiratory  failure with hypoxia Secondary to subarachnoid hemorrhage, per nursing notes hypoxic with EMS on transport. ? Aspiration event. CXR with LUL infiltrate.  Neck edema, Noted mild edema in neck per radiology report. P -LTVV strategy with tidal volumes of 4-8 cc/kg ideal body weight -Goal plateau pressures less than 30 and driving pressures less than 15 -Wean  PEEP/FiO2 for SpO2 92-98% -VAP bundle -Daily SAT and SBT -PAD bundle with Propofol gtt and fentanyl push -Follow intermittent CXR and ABG PRN -Obtain CXR now and ABG 1 hour post arrival to ICU -Start unasyn. Check tracheal aspirate, PCT. -Monitor clinical exam of neck while weaning, repeat imaging if needed.   Hypertension History of thoracic aneurysm ? S/p repair -Hold home antihypertensives at this time -Outpatient follow up for thoracic aneurism.  Best Practice (right click and "Reselect all SmartList Selections" daily)   Diet/type: NPO w/ meds via tube DVT prophylaxis: SCD GI prophylaxis: PPI Lines: N/A Foley:  Yes, and it is still needed Code Status:  full code Last date of multidisciplinary goals of care discussion [pending]  Labs   CBC: Recent Labs  Lab 06/20/21 1800 06/20/21 1802  WBC  --  11.1*  NEUTROABS  --  6.2  HGB 12.6 11.6*  HCT 37.0 36.9  MCV  --  91.3  PLT  --  256    Basic Metabolic Panel: Recent Labs  Lab 06/20/21 1800  NA 141  K 3.9  CL 106  GLUCOSE 184*  BUN 19  CREATININE 0.70   GFR: Estimated Creatinine Clearance: 90.8 mL/min (by C-G formula based on SCr of 0.7 mg/dL). Recent Labs  Lab 06/20/21 1802  WBC 11.1*    Liver Function Tests: No results for input(s): AST, ALT, ALKPHOS, BILITOT, PROT, ALBUMIN in the last 168 hours. No results for input(s): LIPASE, AMYLASE in the last 168 hours. No results for input(s): AMMONIA in the last 168 hours.  ABG    Component Value Date/Time   TCO2 24 06/20/2021 1800     Coagulation Profile: Recent Labs  Lab 06/20/21 1802  INR 1.2    Cardiac Enzymes: No results for input(s): CKTOTAL, CKMB, CKMBINDEX, TROPONINI in the last 168 hours.  HbA1C: No results found for: HGBA1C  CBG: Recent Labs  Lab 06/20/21 1758  GLUCAP 162*    Review of Systems:   Unable to obtain ROS due to patient status  Past Medical History:  She,  has no past medical history on file.   Surgical  History:  No known surgical hx  Social History:      Family History:  Her family history is not on file.   Allergies No Known Allergies   Home Medications  Prior to Admission medications   Not on File     Critical care time: 42 minutes    Gershon Mussel., MSN, APRN, AGACNP-BC [facility]  06/20/2021 , 7:51 PM  Please see [email or web address] for pager details  If no response, please call [phone] After hours, please call Elink at [phone]

## 2021-06-20 NOTE — Progress Notes (Signed)
Pharmacy Antibiotic Note  Yolanda Boone is a 61 y.o. female admitted on 06/20/2021 with [facility] and emergently intubated now with concern for aspiration pneumonia.  Pharmacy has been consulted for unasyn dosing.  Plan: Unasyn 3g q6h F/u renal function, length of therapy, and clinical course  Height: 5\' 7"  (170.2 cm) Weight: 95 kg (209 lb 7 oz) IBW/kg (Calculated) : 61.6  Temp (24hrs), Avg:95.7 F (35.4 C), Min:95.7 F (35.4 C), Max:95.7 F (35.4 C)  Recent Labs  Lab 06/20/21 1800 06/20/21 1802  WBC  --  11.1*  CREATININE 0.70 0.82    Estimated Creatinine Clearance: 88.5 mL/min (by C-G formula based on SCr of 0.82 mg/dL).    No Known Allergies  Antimicrobials this admission: Unasyn 5/28 >  Dose adjustments this admission:  Microbiology results: 5/28 Resp Cx: sent  Thank you for allowing pharmacy to be a part of this patient's care.  6/28 06/20/2021 7:55 PM

## 2021-06-20 NOTE — ED Triage Notes (Addendum)
Patient found at 1615 by family unresponsive between night stand and bed. Had been up and active all day with no complaints. Patient arrived posturing and fixed gaze to left. Pulse ox enroute dropped to 60s and was suctioned and bagged to ED. No hx of seizures, GCS 3 on arrival. RT and MD at bedside on patient arrival

## 2021-06-20 NOTE — ED Provider Notes (Signed)
[facility] EMERGENCY DEPARTMENT Provider Note   CSN: [id number] Arrival date & time: 06/20/21  1752  LEVEL 5 CAVEAT - ALTERED MENTAL STATUS   History  Chief Complaint  Patient presents with   Code Stroke    Yolanda Boone is a 61 y.o. female.  HPI 61 year old female presents as a code stroke. Last known well was approximately 4:15 pm per EMS.  Patient was found between the bed and the wall by family.  She has been unresponsive and possibly seizing.  Has had a left sided gaze and appears to have posturing per EMS.  Otherwise history of present illness and past medical history is limited at this time.  Blood pressure was last 160/130 by EMS.  She has been on a nonrebreather.  Home Medications Prior to Admission medications   Not on File      Allergies    Patient has no known allergies.    Review of Systems   Review of Systems  Unable to perform ROS: Patient unresponsive   Physical Exam Updated Vital Signs BP (!) 129/104   Pulse 87   Temp (!) 95.7 F (35.4 C) (Temporal)   Resp (!) 21   Ht 5\' 7"  (1.702 m)   Wt 95 kg   SpO2 100%   BMI 32.80 kg/m  Physical Exam Vitals and nursing note reviewed.  Constitutional:      Appearance: She is well-developed.  HENT:     Head: Normocephalic and atraumatic.  Eyes:     Comments: Leftward gaze bilaterally  Cardiovascular:     Rate and Rhythm: Regular rhythm. Tachycardia present.     Heart sounds: Normal heart sounds.  Pulmonary:     Breath sounds: Normal breath sounds.     Comments: Poor inspiratory effort.  She is breathing on her own Abdominal:     General: There is no distension.     Palpations: Abdomen is soft.  Skin:    General: Skin is warm and dry.  Neurological:     Mental Status: She is unresponsive.     Comments: Patient is unresponsive.  She does not open eyes or verbally respond.  Patient has stiff RUE. LUE seems to be spontaneously moving.  Is not currently moving lower extremities  bilaterally    ED Results / Procedures / Treatments   Labs (all labs ordered are listed, but only abnormal results are displayed) Labs Reviewed  CBC - Abnormal; Notable for the following components:      Result Value   WBC 11.1 (*)    Hemoglobin 11.6 (*)    All other components within normal limits  COMPREHENSIVE METABOLIC PANEL - Abnormal; Notable for the following components:   Glucose, Bld 189 (*)    Total Protein 6.3 (*)    Albumin 3.2 (*)    All other components within normal limits  MAGNESIUM - Abnormal; Notable for the following components:   Magnesium 1.6 (*)    All other components within normal limits  I-STAT CHEM 8, ED - Abnormal; Notable for the following components:   Glucose, Bld 184 (*)    All other components within normal limits  CBG MONITORING, ED - Abnormal; Notable for the following components:   Glucose-Capillary 162 (*)    All other components within normal limits  I-STAT ARTERIAL BLOOD GAS, ED - Abnormal; Notable for the following components:   pH, Arterial 7.344 (*)    pO2, Arterial 141 (*)    Sodium 146 (*)  Potassium 2.8 (*)    HCT 34.0 (*)    Hemoglobin 11.6 (*)    All other components within normal limits  SARS CORONAVIRUS 2 BY RT PCR  CULTURE, RESPIRATORY W GRAM STAIN  PROTIME-INR  APTT  DIFFERENTIAL  TRIGLYCERIDES  LACTIC ACID, PLASMA  LACTIC ACID, PLASMA  SODIUM  SODIUM  RAPID URINE DRUG SCREEN, HOSP PERFORMED  PROCALCITONIN  CBC  BASIC METABOLIC PANEL  MAGNESIUM  PHOSPHORUS  I-STAT BETA HCG BLOOD, ED (MC, [facility], AP ONLY)  I-STAT ARTERIAL BLOOD GAS, ED    EKG EKG Interpretation  Date/Time:  Sunday Jun 20 2021 17:58:59 EDT Ventricular Rate:  98 PR Interval:  171 QRS Duration: 95 QT Interval:  393 QTC Calculation: 502 R Axis:   18 Text Interpretation: Sinus rhythm Borderline prolonged QT interval No old tracing to compare Confirmed by Sherwood Gambler [phone]) on 06/20/2021 6:03:32 PM  Radiology DG Chest Portable 1  View  Result Date: 06/20/2021 CLINICAL DATA:  Intubation EXAM: PORTABLE CHEST 1 VIEW COMPARISON:  None Available. FINDINGS: Endotracheal tube at the carina.  Withdrawal 3 cm is suggested. Possible mild central left upper lobe opacity, equivocal. This could reflect mild aspiration. Lungs are otherwise clear No pleural effusion or pneumothorax. The heart is normal in size. IMPRESSION: Endotracheal tube at the carina. Withdrawal 3 cm is suggested. Possible mild central left upper lobe opacity, equivocal. This could reflect mild aspiration. Electronically Signed   By: Julian Hy M.D.   On: 06/20/2021 18:48   CT HEAD CODE STROKE WO CONTRAST  Result Date: 06/20/2021 CLINICAL DATA:  Code stroke.  Neuro deficit, acute, stroke suspected EXAM: CT HEAD WITHOUT CONTRAST TECHNIQUE: Contiguous axial images were obtained from the base of the skull through the vertex without intravenous contrast. RADIATION DOSE REDUCTION: This exam was performed according to the departmental dose-optimization program which includes automated exposure control, adjustment of the mA and/or kV according to patient size and/or use of iterative reconstruction technique. COMPARISON:  None Available. FINDINGS: Brain: Large volume subarachnoid hemorrhage, greatest within the left sylvian fissure. Hemorrhage also extends into the suprasellar cistern, basal cisterns, right sylvian fissure and along the falx. Hemorrhage results in effacement of the left lateral ventricle and approximately 4 mm of rightward midline shift. No evidence of acute large vascular territory infarct, mass lesion, or hydrocephalus. Vascular: Nondiagnostic evaluation for hyperdense vessel due to the hemorrhage. Filling possible defect in the region of the left MCA bifurcation. Skull: No acute fracture. Sinuses/Orbits: Clear visualized sinuses. No acute orbital findings. IMPRESSION: 1. Large volume of acute subarachnoid hemorrhage, described above and greatest in the left  sylvian fissure with possible rounded filling defect in the region of the left MCA bifurcation. Recommend emergent CTA to assess for ruptured aneurysm (possibly left MCA bifurcation). 2. Resulting mass effect with approximately 4 mm of rightward midline shift. Findings and recommendations discussed with Dr. Theda Sers via telephone at 6:18 PM. Electronically Signed   By: Margaretha Sheffield M.D.   On: 06/20/2021 18:21   CT ANGIO HEAD NECK W WO CM (CODE STROKE)  Result Date: 06/20/2021 CLINICAL DATA:  Code stroke. EXAM: CT ANGIOGRAPHY HEAD AND NECK TECHNIQUE: Multidetector CT imaging of the head and neck was performed using the standard protocol during bolus administration of intravenous contrast. Multiplanar CT image reconstructions and MIPs were obtained to evaluate the vascular anatomy. Carotid stenosis measurements (when applicable) are obtained utilizing NASCET criteria, using the distal internal carotid diameter as the denominator. RADIATION DOSE REDUCTION: This exam was performed according to the departmental  dose-optimization program which includes automated exposure control, adjustment of the mA and/or kV according to patient size and/or use of iterative reconstruction technique. CONTRAST:  44mL OMNIPAQUE IOHEXOL 350 MG/ML SOLN COMPARISON:  CT head from the same day. FINDINGS: CTA NECK FINDINGS Aortic arch: Great vessel origins are patent. Aberrant origin of the right subclavian artery. Possible outpouching of the imaged distal aortic arch and proximal descending aorta with linear filling defect. Right carotid system: No evidence of dissection, stenosis (50% or greater) or occlusion. Left carotid system: No evidence of dissection, stenosis (50% or greater) or occlusion. Vertebral arteries: Limited visualization due to streak artifact from pooled contrast. The left vertebral artery is dominant. The right vertebral artery is small throughout its course. No visible high-grade stenosis. Skeleton: No evidence of  acute fracture. Other neck: Mild edema in the lower neck about the thyroid. Upper chest: Patchy opacities in the visualized upper lobes bilaterally. Review of the MIP images confirms the above findings CTA HEAD FINDINGS Anterior circulation: Bilateral intracranial ICAs are patent. Small/diminutive M1 MCAs and ACAs bilaterally, which are patent. Irregular/lobulated 5 x 2 mm outpouching arising from the left MCA bifurcation, compatible with ruptured aneurysm. 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 Posterior circulation: Right/non dominant vertebral artery appears to terminate as PICA. Left vertebral artery and basilar artery are patent. Vertebrobasilar system is small/diminutive with bilateral fetal type PCAs, anatomic variant. Bilateral PCAs are patent and small. Venous sinuses: As permitted by contrast timing, patent. Anatomic variants: Detailed above. Review of the MIP images confirms the above findings IMPRESSION: 1. Irregular/lobulated 5 x 2 mm outpouching arising from the left MCA bifurcation, compatible with aneurysm that is probably ruptured. Recommend urgent neurointerventional consultation. 2. 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 3. No emergent large vessel occlusion. Many of the intracranial arteries are diminutive, which could be congenital and/or related to vasospasm. 4. Possible outpouching of the imaged distal aortic arch and proximal descending aorta with linear filling defect. This is indeterminate and incompletely imaged, but may represent chronic, fenestrated dissection or aneurysm at the origin of an aberrant subclavian artery. Recommend follow-up CTA of the chest to further evaluate. 5. Mild edema in the lower neck about the thyroid. This is nonspecific but could be traumatic related to recent emergent intubation. A follow-up CT of the neck could further evaluate if clinically warranted. 6. Patchy opacities in the upper lobes  bilaterally, possibly aspiration. Consider correlation with dedicated chest imaging. Emergent findings discussed with Dr. Theda Sers via telephone at 6:40 p.m. Electronically Signed   By: Margaretha Sheffield M.D.   On: 06/20/2021 19:00    Procedures .Critical Care Performed by: Sherwood Gambler, MD Authorized by: Sherwood Gambler, MD   Critical care provider statement:    Critical care time (minutes):  50   Critical care time was exclusive of:  Separately billable procedures and treating other patients   Critical care was necessary to treat or prevent imminent or life-threatening deterioration of the following conditions:  Respiratory failure and CNS failure or compromise   Critical care was time spent personally by me on the following activities:  Development of treatment plan with patient or surrogate, discussions with consultants, evaluation of patient's response to treatment, examination of patient, ordering and review of laboratory studies, ordering and review of radiographic studies, ordering and performing treatments and interventions, pulse oximetry, re-evaluation of patient's condition, review of old charts and ventilator management Procedure Name: Intubation Date/Time: 06/20/2021 7:58 PM Performed by: Regenia Skeeter,  Nicki Reaper, MD Pre-anesthesia Checklist: Patient identified, Patient being monitored, Emergency Drugs available, Timeout performed and Suction available Oxygen Delivery Method: Non-rebreather mask Preoxygenation: Pre-oxygenation with 100% oxygen Induction Type: Rapid sequence Ventilation: Mask ventilation without difficulty Laryngoscope Size: Glidescope and 3 Grade View: Grade I Tube size: 7.5 mm Number of attempts: 1 Airway Equipment and Method: Video-laryngoscopy Placement Confirmation: ETT inserted through vocal cords under direct vision, CO2 detector and Breath sounds checked- equal and bilateral Secured at: 22 cm Tube secured with: ETT holder Dental Injury: Teeth and Oropharynx  as per pre-operative assessment  Comments: After seeing chest x-ray, tube was retracted       Medications Ordered in ED Medications  sodium chloride (hypertonic) 3 % solution ( Intravenous New Bag/Given 06/20/21 1910)  clevidipine (CLEVIPREX) infusion 0.5 mg/mL (0 mg/hr Intravenous Stopped 06/20/21 1930)  pantoprazole sodium (PROTONIX) 40 mg/20 mL oral suspension 40 mg (has no administration in time range)  docusate (COLACE) 50 MG/5ML liquid 100 mg (has no administration in time range)  polyethylene glycol (MIRALAX / GLYCOLAX) packet 17 g (has no administration in time range)  pantoprazole (PROTONIX) injection 40 mg (has no administration in time range)  propofol (DIPRIVAN) 1000 MG/100ML infusion (has no administration in time range)  fentaNYL (SUBLIMAZE) injection 50-200 mcg (has no administration in time range)  magnesium sulfate IVPB 2 g 50 mL (has no administration in time range)  etomidate (AMIDATE) injection (20 mg Intravenous Given 06/20/21 1755)  succinylcholine (ANECTINE) injection (150 mg Intravenous Given 06/20/21 1756)  sodium chloride flush (NS) 0.9 % injection 3 mL (3 mLs Intravenous Given 06/20/21 1847)  sodium chloride 0.9 % bolus 1,000 mL (1,000 mLs Intravenous New Bag/Given 06/20/21 1900)  sodium chloride 3% (hypertonic) IV bolus 500 mL (0 mLs Intravenous Stopped 06/20/21 1845)  iohexol (OMNIPAQUE) 350 MG/ML injection 100 mL (75 mLs Intravenous Contrast Given 06/20/21 1813)  levETIRAcetam (KEPPRA) IVPB 1000 mg/100 mL premix (0 mg Intravenous Stopped 06/20/21 1835)    Followed by  levETIRAcetam (KEPPRA) IVPB 1000 mg/100 mL premix (0 mg Intravenous Stopped 06/20/21 1900)    Followed by  levETIRAcetam (KEPPRA) IVPB 1000 mg/100 mL premix (0 mg Intravenous Stopped 06/20/21 1916)    Followed by  levETIRAcetam (KEPPRA) IVPB 1000 mg/100 mL premix (1,000 mg Intravenous New Bag/Given 06/20/21 1921)    ED Course/ Medical Decision Making/ A&P                           Medical Decision  Making Problems Addressed: Acute respiratory failure, unspecified whether with hypoxia or hypercapnia ([place]): acute illness or injury that poses a threat to life or bodily functions Aneurysmal subarachnoid hemorrhage ([place]): acute illness or injury with systemic symptoms that poses a threat to life or bodily functions  Amount and/or Complexity of Data Reviewed Independent Historian: EMS External Data Reviewed: notes. Labs: ordered. Radiology: ordered and independent interpretation performed. ECG/medicine tests: ordered and independent interpretation performed.  Risk Prescription drug management. Decision regarding hospitalization.   Patient presents as a code stroke and is severely altered and was intubated for airway protection.  There was a question of whether she was seizing versus having a head bleed.  Unfortunately the CT head shows a sizable subarachnoid hemorrhage with midline shift.  CT angiography shows ruptured aneurysm.  I personally viewed/interpret these images.  Chest x-ray later shows no pneumothorax but does show the ET tube is right at the carina and respiratory therapy will retract the ET tube about 3 cm.  Otherwise,  she was placed on propofol after intubation and then later Cleviprex to help with blood pressure control.  As needed fentanyl was ordered.  ECG viewed/interpreted by myself and she has a borderline prolonged QTc but no acute ischemia.  Otherwise has a leukocytosis of 11.1 that seems reactive.  ABG is okay.  I have discussed her critical illness with brother and daughter who are close his relatives.  Neurology was at the bedside upon patient arrival.  I have also consulted neurosurgery, Weston Brass and Dr. Marcello Moores who will ultimately admit.  Also discussed with intensivist, Dr. Loanne Drilling, who will consult for ventilator management.  Patient is in critical condition and will be admitted to the neuro ICU.        Final Clinical Impression(s) / ED  Diagnoses Final diagnoses:  Aneurysmal subarachnoid hemorrhage ([place])  Acute respiratory failure, unspecified whether with hypoxia or hypercapnia [facility])    Rx / DC Orders ED Discharge Orders     None         Sherwood Gambler, MD 06/20/21 2001

## 2021-06-20 NOTE — ED Notes (Addendum)
TO CT NOW for CT Head

## 2021-06-20 NOTE — Progress Notes (Signed)
Patient transported to CT and back without complications. RN at bedside.  

## 2021-06-20 NOTE — Progress Notes (Addendum)
RT and RN transported vent patient from ED to 4  [place] 22. Vital signs stable through out.

## 2021-06-21 ENCOUNTER — Inpatient Hospital Stay (HOSPITAL_COMMUNITY): Payer: BC Managed Care – PPO

## 2021-06-21 ENCOUNTER — Encounter (HOSPITAL_COMMUNITY)
Admission: EM | Disposition: A | Discharge status: Skilled Nursing Facility | Payer: Self-pay | Source: Home / Self Care | Attending: Neurosurgery

## 2021-06-21 ENCOUNTER — Inpatient Hospital Stay (HOSPITAL_COMMUNITY): Payer: BC Managed Care – PPO | Admitting: Certified Registered Nurse Anesthetist

## 2021-06-21 DIAGNOSIS — J9600 Acute respiratory failure, unspecified whether with hypoxia or hypercapnia: Secondary | ICD-10-CM | POA: Diagnosis not present

## 2021-06-21 DIAGNOSIS — I6389 Other cerebral infarction: Secondary | ICD-10-CM | POA: Diagnosis not present

## 2021-06-21 DIAGNOSIS — I608 Other nontraumatic subarachnoid hemorrhage: Secondary | ICD-10-CM

## 2021-06-21 DIAGNOSIS — I71019 Dissection of thoracic aorta, unspecified: Secondary | ICD-10-CM

## 2021-06-21 DIAGNOSIS — I609 Nontraumatic subarachnoid hemorrhage, unspecified: Secondary | ICD-10-CM

## 2021-06-21 HISTORY — PX: CRANIOTOMY: SHX93

## 2021-06-21 HISTORY — PX: IR ANGIOGRAM FOLLOW UP STUDY: IMG697

## 2021-06-21 HISTORY — PX: IR TRANSCATH/EMBOLIZ: IMG695

## 2021-06-21 HISTORY — PX: IR NEURO EACH ADD'L AFTER BASIC UNI LEFT (MS): IMG5373

## 2021-06-21 HISTORY — PX: IR 3D INDEPENDENT WKST: IMG2385

## 2021-06-21 HISTORY — PX: IR ANGIO VERTEBRAL SEL VERTEBRAL UNI L MOD SED: IMG5367

## 2021-06-21 HISTORY — PX: IR ANGIO INTRA EXTRACRAN SEL INTERNAL CAROTID BILAT MOD SED: IMG5363

## 2021-06-21 LAB — CBC
HCT: 36.9 % (ref 36.0–46.0)
Hemoglobin: 11.6 g/dL — ABNORMAL LOW (ref 12.0–15.0)
MCH: 28.6 pg (ref 26.0–34.0)
MCHC: 31.4 g/dL (ref 30.0–36.0)
MCV: 90.9 fL (ref 80.0–100.0)
Platelets: 214 10*3/uL (ref 150–400)
RBC: 4.06 MIL/uL (ref 3.87–5.11)
RDW: 13.2 % (ref 11.5–15.5)
WBC: 9.2 10*3/uL (ref 4.0–10.5)
nRBC: 0 % (ref 0.0–0.2)

## 2021-06-21 LAB — PHOSPHORUS: Phosphorus: 2.7 mg/dL (ref 2.5–4.6)

## 2021-06-21 LAB — ECHOCARDIOGRAM COMPLETE
Area-P 1/2: 3.42 cm2
Calc EF: 42.4 %
Height: 67 in
S' Lateral: 4.2 cm
Single Plane A2C EF: 46.9 %
Single Plane A4C EF: 40.7 %
Weight: 4035.3 oz

## 2021-06-21 LAB — BASIC METABOLIC PANEL
Anion gap: 9 (ref 5–15)
BUN: 9 mg/dL (ref 6–20)
CO2: 22 mmol/L (ref 22–32)
Calcium: 8.3 mg/dL — ABNORMAL LOW (ref 8.9–10.3)
Chloride: 115 mmol/L — ABNORMAL HIGH (ref 98–111)
Creatinine, Ser: 0.72 mg/dL (ref 0.44–1.00)
GFR, Estimated: >60 mL/min (ref >60)
Glucose, Bld: 104 mg/dL — ABNORMAL HIGH (ref 70–99)
Potassium: 3.4 mmol/L — ABNORMAL LOW (ref 3.5–5.1)
Sodium: 146 mmol/L — ABNORMAL HIGH (ref 135–145)

## 2021-06-21 LAB — TYPE AND SCREEN
ABO/RH(D): AB POS
Antibody Screen: NEGATIVE

## 2021-06-21 LAB — HEMOGLOBIN A1C
Hgb A1c MFr Bld: 5.5 % (ref 4.8–5.6)
Mean Plasma Glucose: 111.15 mg/dL

## 2021-06-21 LAB — LIPID PANEL
Cholesterol: 208 mg/dL — ABNORMAL HIGH (ref 0–200)
HDL: 78 mg/dL (ref >40)
LDL Cholesterol: 105 mg/dL — ABNORMAL HIGH (ref 0–99)
Total CHOL/HDL Ratio: 2.7 RATIO
Triglycerides: 124 mg/dL (ref <150)
VLDL: 25 mg/dL (ref 0–40)

## 2021-06-21 LAB — HIV ANTIBODY (ROUTINE TESTING W REFLEX): HIV Screen 4th Generation wRfx: NONREACTIVE

## 2021-06-21 LAB — MAGNESIUM: Magnesium: 1.7 mg/dL (ref 1.7–2.4)

## 2021-06-21 LAB — PROCALCITONIN: Procalcitonin: 0.18 ng/mL

## 2021-06-21 LAB — TRIGLYCERIDES: Triglycerides: 119 mg/dL (ref <150)

## 2021-06-21 LAB — ABO/RH: ABO/RH(D): AB POS

## 2021-06-21 SURGERY — CRANIOTOMY HEMATOMA EVACUATION SUBDURAL
Anesthesia: General

## 2021-06-21 MED ORDER — PERFLUTREN LIPID MICROSPHERE
1.0000 mL | INTRAVENOUS | Status: AC | PRN
  Administered 2021-06-21: 3 mL via INTRAVENOUS

## 2021-06-21 MED ORDER — MIDAZOLAM HCL 2 MG/2ML IJ SOLN
INTRAMUSCULAR | Status: DC | PRN
  Administered 2021-06-21: 2 mg via INTRAVENOUS

## 2021-06-21 MED ORDER — ROCURONIUM BROMIDE 10 MG/ML (PF) SYRINGE
PREFILLED_SYRINGE | INTRAVENOUS | Status: DC | PRN
  Administered 2021-06-21: 80 mg via INTRAVENOUS
  Administered 2021-06-21: 40 mg via INTRAVENOUS
  Administered 2021-06-21: 50 mg via INTRAVENOUS
  Administered 2021-06-21: 30 mg via INTRAVENOUS

## 2021-06-21 MED ORDER — MAGNESIUM SULFATE 2 GM/50ML IV SOLN
2.0000 g | Freq: Once | INTRAVENOUS | Status: AC
  Administered 2021-06-21: 2 g via INTRAVENOUS
  Filled 2021-06-21: qty 50

## 2021-06-21 MED ORDER — LABETALOL HCL 5 MG/ML IV SOLN
10.0000 mg | INTRAVENOUS | Status: DC | PRN
  Administered 2021-06-21 – 2021-06-23 (×5): 10 mg via INTRAVENOUS
  Filled 2021-06-21 (×5): qty 4

## 2021-06-21 MED ORDER — IOHEXOL 300 MG/ML  SOLN
100.0000 mL | Freq: Once | INTRAMUSCULAR | Status: AC | PRN
  Administered 2021-06-21: 45 mL via INTRA_ARTERIAL

## 2021-06-21 MED ORDER — POTASSIUM CHLORIDE 10 MEQ/100ML IV SOLN
10.0000 meq | INTRAVENOUS | Status: AC
  Administered 2021-06-21 (×2): 10 meq via INTRAVENOUS
  Filled 2021-06-21 (×2): qty 100

## 2021-06-21 MED ORDER — FENTANYL CITRATE (PF) 100 MCG/2ML IJ SOLN
INTRAMUSCULAR | Status: AC
  Filled 2021-06-21: qty 2

## 2021-06-21 MED ORDER — FENTANYL CITRATE (PF) 100 MCG/2ML IJ SOLN
INTRAMUSCULAR | Status: DC | PRN
  Administered 2021-06-21: 100 ug via INTRAVENOUS

## 2021-06-21 MED ORDER — CHLORHEXIDINE GLUCONATE 0.12% ORAL RINSE (MEDLINE KIT)
15.0000 mL | Freq: Two times a day (BID) | OROMUCOSAL | Status: DC
  Administered 2021-06-21 – 2021-06-25 (×10): 15 mL via OROMUCOSAL

## 2021-06-21 MED ORDER — IOHEXOL 300 MG/ML  SOLN
100.0000 mL | Freq: Once | INTRAMUSCULAR | Status: AC | PRN
  Administered 2021-06-21: 75 mL via INTRA_ARTERIAL

## 2021-06-21 MED ORDER — POTASSIUM CHLORIDE 20 MEQ PO PACK
40.0000 meq | PACK | Freq: Once | ORAL | Status: AC
  Administered 2021-06-21: 40 meq
  Filled 2021-06-21: qty 2

## 2021-06-21 MED ORDER — SODIUM CHLORIDE 0.9 % IV SOLN: INTRAVENOUS | Status: DC | PRN

## 2021-06-21 MED ORDER — MIDAZOLAM HCL 2 MG/2ML IJ SOLN
INTRAMUSCULAR | Status: AC
  Filled 2021-06-21: qty 2

## 2021-06-21 MED ORDER — ORAL CARE MOUTH RINSE
15.0000 mL | OROMUCOSAL | Status: DC
  Administered 2021-06-21 – 2021-06-25 (×47): 15 mL via OROMUCOSAL

## 2021-06-21 MED ORDER — SUGAMMADEX SODIUM 200 MG/2ML IV SOLN
INTRAVENOUS | Status: DC | PRN
  Administered 2021-06-21: 200 mg via INTRAVENOUS

## 2021-06-21 MED ORDER — PHENYLEPHRINE 80 MCG/ML (10ML) SYRINGE FOR IV PUSH (FOR BLOOD PRESSURE SUPPORT)
PREFILLED_SYRINGE | INTRAVENOUS | Status: DC | PRN
  Administered 2021-06-21 (×2): 80 ug via INTRAVENOUS

## 2021-06-21 SURGICAL SUPPLY — 65 items
BAG COUNTER SPONGE SURGICOUNT (BAG) IMPLANT
BENZOIN TINCTURE PRP APPL 2/3 (GAUZE/BANDAGES/DRESSINGS) IMPLANT
BLADE CLIPPER SURG (BLADE) IMPLANT
BNDG GAUZE ELAST 4 BULKY (GAUZE/BANDAGES/DRESSINGS) IMPLANT
BUR ACORN 6.0 PRECISION (BURR) IMPLANT
BUR MATCHSTICK NEURO 3.0 LAGG (BURR) IMPLANT
BUR SPIRAL ROUTER 2.3 (BUR) IMPLANT
CANISTER SUCT 3000ML PPV (MISCELLANEOUS) IMPLANT
CARTRIDGE OIL MAESTRO DRILL (MISCELLANEOUS) IMPLANT
CATH ROBINSON RED A/P 14FR (CATHETERS) IMPLANT
CLIP VESOCCLUDE MED 6/CT (CLIP) IMPLANT
DIFFUSER DRILL AIR PNEUMATIC (MISCELLANEOUS) IMPLANT
DRAPE NEUROLOGICAL W/INCISE (DRAPES) IMPLANT
DRAPE SURG 17X23 STRL (DRAPES) IMPLANT
DRAPE WARM FLUID 44X44 (DRAPES) IMPLANT
DURAPREP 6ML APPLICATOR 50/CS (WOUND CARE) IMPLANT
ELECT REM PT RETURN 9FT ADLT (ELECTROSURGICAL)
ELECTRODE REM PT RTRN 9FT ADLT (ELECTROSURGICAL) IMPLANT
EVACUATOR 1/8 PVC DRAIN (DRAIN) IMPLANT
EVACUATOR SILICONE 100CC (DRAIN) IMPLANT
GAUZE 4X4 16PLY ~~LOC~~+RFID DBL (SPONGE) IMPLANT
GAUZE SPONGE 4X4 12PLY STRL (GAUZE/BANDAGES/DRESSINGS) IMPLANT
GLOVE BIO SURGEON STRL SZ7.5 (GLOVE) IMPLANT
GLOVE BIOGEL PI IND STRL 7.5 (GLOVE) IMPLANT
GLOVE BIOGEL PI INDICATOR 7.5 (GLOVE)
GLOVE ECLIPSE 7.0 STRL STRAW (GLOVE) IMPLANT
GLOVE EXAM NITRILE XL STR (GLOVE) IMPLANT
GOWN STRL REUS W/ TWL LRG LVL3 (GOWN DISPOSABLE) IMPLANT
GOWN STRL REUS W/ TWL XL LVL3 (GOWN DISPOSABLE) IMPLANT
GOWN STRL REUS W/TWL 2XL LVL3 (GOWN DISPOSABLE) IMPLANT
GOWN STRL REUS W/TWL LRG LVL3 (GOWN DISPOSABLE)
GOWN STRL REUS W/TWL XL LVL3 (GOWN DISPOSABLE)
HEMOSTAT POWDER KIT SURGIFOAM (HEMOSTASIS) IMPLANT
HEMOSTAT SURGICEL 2X14 (HEMOSTASIS) IMPLANT
HOOK DURA 1/2IN (MISCELLANEOUS) IMPLANT
IV NS IRRIG 3000ML ARTHROMATIC (IV SOLUTION) IMPLANT
KIT BASIN OR (CUSTOM PROCEDURE TRAY) IMPLANT
KIT TURNOVER KIT B (KITS) IMPLANT
NEEDLE HYPO 22GX1.5 SAFETY (NEEDLE) IMPLANT
NS IRRIG 1000ML POUR BTL (IV SOLUTION) IMPLANT
OIL CARTRIDGE MAESTRO DRILL (MISCELLANEOUS)
PACK BATTERY CMF DISP FOR DVR (ORTHOPEDIC DISPOSABLE SUPPLIES) IMPLANT
PACK CRANIOTOMY CUSTOM (CUSTOM PROCEDURE TRAY) IMPLANT
PATTIES SURGICAL .5 X.5 (GAUZE/BANDAGES/DRESSINGS) IMPLANT
PATTIES SURGICAL .5 X3 (DISPOSABLE) IMPLANT
PATTIES SURGICAL 1X1 (DISPOSABLE) IMPLANT
SET CYSTO W/LG BORE CLAMP LF (SET/KITS/TRAYS/PACK) IMPLANT
SPONGE NEURO XRAY DETECT 1X3 (DISPOSABLE) IMPLANT
SPONGE SURGIFOAM ABS GEL 100 (HEMOSTASIS) IMPLANT
STAPLER VISISTAT 35W (STAPLE) IMPLANT
STOCKINETTE 6  STRL (DRAPES)
STOCKINETTE 6 STRL (DRAPES) IMPLANT
SUT ETHILON 3 0 FSL (SUTURE) IMPLANT
SUT ETHILON 3 0 PS 1 (SUTURE) IMPLANT
SUT NURALON 4 0 TR CR/8 (SUTURE) IMPLANT
SUT VIC AB 0 CT1 18XCR BRD8 (SUTURE) IMPLANT
SUT VIC AB 0 CT1 8-18 (SUTURE)
SUT VIC AB 3-0 SH 8-18 (SUTURE) IMPLANT
TAPE CLOTH 1X10 TAN NS (GAUZE/BANDAGES/DRESSINGS) IMPLANT
TOWEL GREEN STERILE (TOWEL DISPOSABLE) IMPLANT
TOWEL GREEN STERILE FF (TOWEL DISPOSABLE) IMPLANT
TRAY FOLEY MTR SLVR 16FR STAT (SET/KITS/TRAYS/PACK) IMPLANT
TUBE CONNECTING 12X1/4 (SUCTIONS) IMPLANT
UNDERPAD 30X36 HEAVY ABSORB (UNDERPADS AND DIAPERS) IMPLANT
WATER STERILE IRR 1000ML POUR (IV SOLUTION) IMPLANT

## 2021-06-21 NOTE — Progress Notes (Signed)
  Echocardiogram 2D Echocardiogram has been performed.  Leta Jungling M 06/21/2021, 12:09 PM

## 2021-06-21 NOTE — Progress Notes (Signed)
Pt's ring was taken off her R thumb b/c of hand swelling.  It was given to her daughter, Kathlen Mody. Rosaleah Person C 7:35 AM
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
## 2021-06-21 NOTE — Progress Notes (Signed)
  Transition of Care [facility]) Screening Note   Patient Details  Name: Yolanda Boone Date of Birth: [birth year]   Transition of Care [facility]) CM/SW Contact:    Cyndi Bender, RN Phone Number: 06/21/2021, 12:54 PM    Transition of Care Department [facility]) has reviewed patient and no TOC needs have been identified at this time. We will continue to monitor patient advancement through interdisciplinary progression rounds. If new patient transition needs arise, please place a TOC consult.

## 2021-06-21 NOTE — Progress Notes (Signed)
EEG complete - results pending 

## 2021-06-21 NOTE — Progress Notes (Signed)
Per RN, patient is down in IR. I had planned to run her study when I d/c 4N28. She was gone by then. Will check back later.

## 2021-06-21 NOTE — Progress Notes (Signed)
SLP Cancellation Note  Patient Details Name: Yolanda Boone MRN: [id number] DOB: April 18, 1960   Cancelled treatment:       Reason Eval/Treat Not Completed: Patient not medically ready (on vent this am). Will f/u as able.    Mahala Menghini., M.A. CCC-SLP Acute Rehabilitation Services Office [phone]  Secure chat preferred  06/21/2021, 9:51 AM

## 2021-06-21 NOTE — Procedures (Signed)
Patient Name: Yolanda Boone  MRN: [id number]  Epilepsy Attending: Lora Havens  Referring Physician/Provider: Rosalin Hawking, MD Date: 06/21/2021 Duration: 25.38 mins  Patient history:  61 y.o. female with history of thoracic aortic aneurysm, asthma, HTN and hyperthyroidism s/p radiation presenting after being found unresponsive by her family with left gaze deviation, right arm flexion and left arm extension.  She was found to have a large SAH in the left sylvian fissure with ruptured left MCA aneurysm.  She has had coiling of the aneurysm. EEG to evaluate for seizure  Level of alertness: comatose  AEDs during EEG study: LEV, propofol  Technical aspects: This EEG study was done with scalp electrodes positioned according to the 10-20 International system of electrode placement. Electrical activity was acquired at a sampling rate of 500Hz  and reviewed with a high frequency filter of 70Hz  and a low frequency filter of 1Hz . EEG data were recorded continuously and digitally stored.   Description: EEG showed continuous generalized and lateralized left hemisphere 3 to 6 Hz theta-delta slowing admixed with 13-15hz  generalized beta activity. Hyperventilation and photic stimulation were not performed.     ABNORMALITY - Continuous slow, generalized and lateralized left hemisphere  IMPRESSION: This study is suggestive of cortical dysfunction arising from left hemisphere likely secondary to underlying structural abnormality. Additionally there is moderate to severe diffuse encephalopathy, nonspecific etiology but likely related to sedation. No seizures or epileptiform discharges were seen throughout the recording.  Freeland Pracht Barbra Sarks

## 2021-06-21 NOTE — Progress Notes (Signed)
[facility] ADULT ICU REPLACEMENT PROTOCOL   The patient does apply for the [facility] Adult ICU Electrolyte Replacment Protocol based on the criteria listed below:   1.Exclusion criteria: TCTS patients, ECMO patients, and Dialysis patients 2. Is GFR >/= 30 ml/min? Yes.    Patient's GFR today is >60 3. Is SCr </= 2? Yes.   Patient's SCr is 0.72 mg/dL 4. Did SCr increase >/= 0.5 in 24 hours? No. 5.Pt's weight >40kg  Yes.   6. Abnormal electrolyte(s): K, Mag  7. Electrolytes replaced per protocol 8.  Call MD STAT for K+ </= 2.5, Phos </= 1, or Mag </= 1 Physician:  Azucena Freed [facility] 06/21/2021 4:49 AM

## 2021-06-21 NOTE — Anesthesia Postprocedure Evaluation (Signed)
Anesthesia Post Note  Patient: Yolanda Boone  Procedure(s) Performed: Rosalin Hawking WITH ANEURYSM COILING     Patient location during evaluation: SICU Anesthesia Type: General Level of consciousness: sedated Pain management: pain level controlled Vital Signs Assessment: post-procedure vital signs reviewed and stable Respiratory status: patient remains intubated per anesthesia plan Cardiovascular status: stable Postop Assessment: no apparent nausea or vomiting Anesthetic complications: no   No notable events documented.  Last Vitals:  Vitals:   06/21/21 0800 06/21/21 1102  BP:  131/83  Pulse: 87 73  Resp: 20 18  Temp: 37.2 C (!) 36.2 C  SpO2: 99% 100%    Last Pain:  Vitals:   06/21/21 0800  TempSrc: Axillary                 Danny Yackley S

## 2021-06-21 NOTE — Transfer of Care (Signed)
Immediate Anesthesia Transfer of Care Note  Patient: Yolanda Boone  Procedure(s) Performed: Cyril Loosen WITH ANEURYSM COILING  Patient Location: ICU  Anesthesia Type:General  Level of Consciousness: Patient remains intubated per anesthesia plan  Airway & Oxygen Therapy: Patient remains intubated per anesthesia plan and Patient placed on Ventilator (see vital sign flow sheet for setting)  Post-op Assessment: Report given to RN and Post -op Vital signs reviewed and stable  Post vital signs: Reviewed and stable  Last Vitals:  Vitals Value Taken Time  BP 131/83 06/21/21 1102  Temp 36.2 C 06/21/21 1108  Pulse 69 06/21/21 1108  Resp 18 06/21/21 1108  SpO2 99 % 06/21/21 1108  Vitals shown include unvalidated device data.  Last Pain:  Vitals:   06/21/21 0800  TempSrc: Axillary         Complications: No notable events documented.

## 2021-06-21 NOTE — Progress Notes (Signed)
PT Cancellation Note  Patient Details Name: Yolanda Boone MRN: [id number] DOB: April 22, 1960   Cancelled Treatment:    Reason Eval/Treat Not Completed: Patient not medically ready;Active bedrest order; going for cerebral angio this am.  Will attempt another day.   Elray Mcgregor 06/21/2021, 9:13 AM Sheran Lawless, PT Acute Rehabilitation Services Pager:[phone] Office:[phone] 06/21/2021

## 2021-06-21 NOTE — Anesthesia Procedure Notes (Signed)
Date/Time: 06/21/2021 8:28 AM Performed by: Drema Pry, CRNA Pre-anesthesia Checklist: Patient identified, Emergency Drugs available, Suction available and Patient being monitored Patient Re-evaluated:Patient Re-evaluated prior to induction Oxygen Delivery Method: Circle system utilized Induction Type: Inhalational induction with existing ETT

## 2021-06-21 NOTE — Consult Note (Signed)
NAME:  Yolanda Boone, MRN:  [id number], DOB:  25-Apr-1960, LOS: 1 ADMISSION DATE:  06/20/2021, CONSULTATION DATE:  5/28 REFERRING MD:  Donavan Burnet FOR CONSULT:  Vent and medical management   History of Present Illness:  Patient is encephalopathic and/or intubated. Therefore history has been obtained from chart review.   Yolanda Boone, is a 61 y.o. female, who presented to the [facility] ED with a chief complaint of being found unresponsive.  Last known normal 1615. Per report was active earlier in day. Found down later in evening. EMS called. Patient hypoxic with sats of 60% during transport.  ED course was notable for left gaze preference and posturing on arrival. Neuro was consulted. They were intubated in the ED. [facility] concerning for large volume subarachnoid hemorrhage, greatest in the left sylvian fissure, mass effect with 4 mm rightward shift.  PCCM was consulted for assistance with ventilator and medical management  Pertinent  Medical History  HTN, Thoracic aneurysm, gastric bypass, history of Graves disease.  Significant Hospital Events: Including procedures, antibiotic start and stop dates in addition to other pertinent events   5/28 present to [facility], PCCM consult, ETT  Interim History / Subjective:  No overnight event Remain intubated on mechanical ventilator  Objective   Blood pressure 118/88, pulse 88, temperature 98.9 F (37.2 C), temperature source Axillary, resp. rate (!) 23, height 5\' 7"  (1.702 m), weight 114.4 kg, SpO2 100 %.    Vent Mode: PRVC FiO2 (%):  [40 %-100 %] 40 % Set Rate:  [18 bmp] 18 bmp Vt Set:  [490 mL] 490 mL PEEP:  [5 cmH20] 5 cmH20 Plateau Pressure:  [17 cmH20-21 cmH20] 17 cmH20   Intake/Output Summary (Last 24 hours) at 06/21/2021 0958 Last data filed at 06/21/2021 0915 Gross per 24 hour  Intake 3467.72 ml  Output 3501 ml  Net -33.28 ml   Filed Weights   06/20/21 1800 06/20/21 2200  Weight: 95 kg 114.4 kg    Examination:   Physical  exam: General: Crtitically ill-appearing female, orally intubated HEENT: [place]/AT, eyes anicteric.  ETT and OGT in place Neuro: Opens eyes with painful stimuli, moving all 4 extremities, not following commands Chest: Coarse breath sounds, no wheezes or rhonchi Heart: Regular rate and rhythm, no murmurs or gallops Abdomen: Soft, nontender, nondistended, bowel sounds present Skin: No rash     Resolved Hospital Problem list     Assessment & Plan:  H/H4, subarachnoid hemorrhage due to ruptured left MCA aneurysm Cerebral edema with brain compression and 4 mm midline shift Left ICA aneurysm Hypertensive emergency Continue neuro watch every hour Neurosurgery is following Patient is going for angiogram and possible coil embolization Maintain normothermia, normal natremia and euvolemia SBP goal 130-150 until aneurysm is secured Continue clevidipine Hold antiplatelet and anticoagulation Keep head of the bed elevated to 30 degrees Continue nimodipine Continue Keppra for seizure prophylaxis Discontinue 3% saline  Acute respiratory failure with hypoxia Aspiration pneumonia Secondary to subarachnoid hemorrhage Continue on protective ventilation VAP bundle Minimize sedation with RASS goal 0/-1 Follow-up respiratory culture Continue IV Unasyn  Subacute/chronic type B thoracic dissection Plan for surgery by vascular at [facility]  Hypomagnesemia/hypokalemia/hypernatremia Continue aggressive electrolyte supplement  Best Practice (right click and "Reselect all SmartList Selections" daily)   Diet/type: NPO w/ meds via tube DVT prophylaxis: SCD GI prophylaxis: PPI Lines: N/A Foley:  Yes, and it is still needed Code Status:  full code Last date of multidisciplinary goals of care discussion [pending]  Labs   CBC: Recent Labs  Lab  06/20/21 1800 06/20/21 1802 06/20/21 1922 06/21/21 0312  WBC  --  11.1*  --  9.2  NEUTROABS  --  6.2  --   --   HGB 12.6 11.6* 11.6* 11.6*  HCT 37.0 36.9  34.0* 36.9  MCV  --  91.3  --  90.9  PLT  --  256  --  214    Basic Metabolic Panel: Recent Labs  Lab 06/20/21 1800 06/20/21 1802 06/20/21 1804 06/20/21 1811 06/20/21 1922 06/20/21 2251 06/21/21 0312  NA 141 141  --  142 146* 144 146*  K 3.9 4.1  --   --  2.8*  --  3.4*  CL 106 109  --   --   --   --  115*  CO2  --  22  --   --   --   --  22  GLUCOSE 184* 189*  --   --   --   --  104*  BUN 19 17  --   --   --   --  9  CREATININE 0.70 0.82  --   --   --   --  0.72  CALCIUM  --  9.1  --   --   --   --  8.3*  MG  --   --  1.6*  --   --   --  1.7  PHOS  --   --   --   --   --   --  2.7   GFR: Estimated Creatinine Clearance: 100.1 mL/min (by C-G formula based on SCr of 0.72 mg/dL). Recent Labs  Lab 06/20/21 1802 06/20/21 2039 06/20/21 2251 06/21/21 0312  PROCALCITON  --   --  0.18  --   WBC 11.1*  --   --  9.2  LATICACIDVEN 2.5* 2.4*  --   --     Liver Function Tests: Recent Labs  Lab 06/20/21 1802  AST 24  ALT 15  ALKPHOS 94  BILITOT 0.3  PROT 6.3*  ALBUMIN 3.2*   No results for input(s): LIPASE, AMYLASE in the last 168 hours. No results for input(s): AMMONIA in the last 168 hours.  ABG    Component Value Date/Time   PHART 7.344 (L) 06/20/2021 1922   PCO2ART 43.6 06/20/2021 1922   PO2ART 141 (H) 06/20/2021 1922   HCO3 24.0 06/20/2021 1922   TCO2 25 06/20/2021 1922   ACIDBASEDEF 2.0 06/20/2021 1922   O2SAT 99 06/20/2021 1922     Coagulation Profile: Recent Labs  Lab 06/20/21 1802 06/20/21 2251  INR 1.2 1.3*    Cardiac Enzymes: No results for input(s): CKTOTAL, CKMB, CKMBINDEX, TROPONINI in the last 168 hours.  HbA1C: No results found for: HGBA1C  CBG: Recent Labs  Lab 06/20/21 1758  GLUCAP 162*    Critical care time:      Total critical care time: 41 minutes  Performed by: Cheri Fowler   Critical care time was exclusive of separately billable procedures and treating other patients.   Critical care was necessary to treat or  prevent imminent or life-threatening deterioration.   Critical care was time spent personally by me on the following activities: development of treatment plan with patient and/or surrogate as well as nursing, discussions with consultants, evaluation of patient's response to treatment, examination of patient, obtaining history from patient or surrogate, ordering and performing treatments and interventions, ordering and review of laboratory studies, ordering and review of radiographic studies, pulse oximetry and re-evaluation of patient's condition.  Cheri Fowler MD [facility] See Amion for pager If no response to pager, please call [phone] until 7pm After 7pm, Please call E-link [phone]

## 2021-06-21 NOTE — Progress Notes (Addendum)
STROKE TEAM PROGRESS NOTE   INTERVAL HISTORY Patient is seen in her room with no family at Yolanda bedside.  Yesterday, she was found unresponsive by her family with left gaze deviation, right arm flexion Yolanda left arm extension.  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  She has had coiling of Yolanda Boone this morning Yolanda remains intubated after Yolanda procedure.  Vitals:   06/21/21 1130 06/21/21 1145 06/21/21 1200 06/21/21 1215  BP:   (!) 129/98   Pulse: 70 71 75 87  Resp: 17 (!) 21 (!) 22 (!) 25  Temp: 97.7 F (36.5 C) 98.1 F (36.7 C) 98.4 F (36.9 C) 99 F (37.2 C)  TempSrc:      SpO2: 96% 96% 97% 96%  Weight:      Height:       CBC:  Recent Labs  Lab 06/20/21 1802 06/20/21 1922 06/21/21 0312  WBC 11.1*  --  9.2  NEUTROABS 6.2  --   --   HGB 11.6* 11.6* 11.6*  HCT 36.9 34.0* 36.9  MCV 91.3  --  90.9  PLT 256  --  [id number]   Basic Metabolic Panel:  Recent Labs  Lab 06/20/21 1802 06/20/21 1804 06/20/21 1811 06/20/21 1922 06/20/21 2251 06/21/21 0312  NA 141  --    < > 146* 144 146*  K 4.1  --   --  2.8*  --  3.4*  CL 109  --   --   --   --  115*  CO2 22  --   --   --   --  22  GLUCOSE 189*  --   --   --   --  104*  BUN 17  --   --   --   --  9  CREATININE 0.82  --   --   --   --  0.72  CALCIUM 9.1  --   --   --   --  8.3*  MG  --  1.6*  --   --   --  1.7  PHOS  --   --   --   --   --  2.7   < > = values in this interval not displayed.   Lipid Panel:  Recent Labs  Lab 06/21/21 0312  CHOL 208*  TRIG 124  119  HDL 78  CHOLHDL 2.7  VLDL 25  LDLCALC 105*   HgbA1c:  Recent Labs  Lab 06/21/21 0312  HGBA1C 5.5   Urine Drug Screen:  Recent Labs  Lab 06/20/21 2312  LABOPIA NONE DETECTED  COCAINSCRNUR NONE DETECTED  LABBENZ NONE DETECTED  AMPHETMU NONE DETECTED  THCU NONE DETECTED  LABBARB NONE DETECTED    Alcohol Level No results for input(s): ETH in Yolanda last 168 hours.  IMAGING past 24 hours IR  Transcath/Emboliz  Result Date: 06/21/2021 PROCEDURE: DIAGNOSTIC CEREBRAL ANGIOGRAM COIL EMBOLIZATION OF LEFT MIDDLE CEREBRAL ARTERY Boone HISTORY: Yolanda patient is a 61 year old woman initially presented to [facility] hospital with decreased level of consciousness. She required intubation for airway protection. Initial CT scan demonstrated diffuse basal subarachnoid hemorrhage with thick left sylvian clot. CT angiogram confirmed Yolanda presence of a left middle cerebral artery Boone as Yolanda source of hemorrhage, with likely incidental right supraclinoid internal carotid artery Boone also identified. 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 ACCESS: Yolanda technical  aspects of Yolanda procedure as well as its potential risks Yolanda benefits were reviewed with Yolanda patient's daughter. These risks included but were not limited to stroke, intracranial hemorrhage, bleeding, infection, allergic reaction, damage to organs or vital structures, stroke, non-diagnostic procedure, Yolanda Yolanda catastrophic outcomes of heart attack, coma, Yolanda death. With an understanding of these risks, informed consent was obtained Yolanda witnessed. Yolanda patient was placed in Yolanda supine position on Yolanda angiography table Yolanda Yolanda skin of right groin prepped in Yolanda usual sterile fashion. Yolanda procedure was performed under general anesthesia. A 5-French sheath was introduced in Yolanda right common femoral artery using Seldinger technique. MEDICATIONS: HEPARIN: 0 Units total (exclusive of heparinized saline flush). CONTRAST:  32mL OMNIPAQUE IOHEXOL 300 MG/ML  SOLNcc, Omnipaque 300 FLUOROSCOPY TIME:  FLUOROSCOPY TIME: See IR records TECHNIQUE: CATHETERS Yolanda WIRES 90 cm pigtail catheter 5-French JB-1 catheter 180 cm 0.035" glidewire 280cm 035 stiff Glidewire 6-French NeuronMax guide sheath 0.058" CatV guidecatheter Excelsior XT 27 microcatheter Synchro select standard microwire  Excelsior XT-17 microcatheter COILS USED Target 360 XL 2.5 mm x 3 cm Target XL nano 1.5 mm x 2 cm VESSELS CATHETERIZED Right internal carotid Left internal carotid Left vertebral Right common femoral Left middle cerebral artery VESSELS STUDIED Right internal carotid, head Left internal carotid, head Left internal carotid, three-dimensional rotational angiogram Left vertebral Left middle cerebral artery, head (pre embolization) Left middle cerebral artery, head (during embolization) Left middle cerebral artery, head (immediate post-embolization) Left internal carotid artery, head (final control) Right common femoral PROCEDURAL NARRATIVE Initially, Yolanda pigtail catheter was advanced into Yolanda distal descending aorta over Yolanda stiff Glidewire. Yolanda Glidewire was then removed Yolanda Yolanda pigtail was reformed. Yolanda pigtail catheter was then gently advanced through Yolanda descending aorta Yolanda over Yolanda aortic arch. Catheter was noted to be in Yolanda true lumen of Yolanda aorta with partial visualization of Yolanda left subclavian origin. Yolanda pigtail catheter was further advanced into Yolanda aortic root, Yolanda stiff Glidewire was reintroduced, Yolanda Yolanda pigtail catheter was exchanged for Yolanda JB 1 glide catheter. Internal carotid artery was then catheterized Yolanda angiogram taken. Further three-dimensional angiogram was taken. 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 Yolanda exchange stiff Glidewire was introduced into Yolanda left internal carotid artery, Yolanda a double exchange was performed, to replace Yolanda 5 [place] sheath with an 8 French sheath, while Yolanda neuron max was advanced into Yolanda distal left common carotid artery. Under roadmap guidance, Yolanda guide catheter was advanced over Yolanda 27 microcatheter into Yolanda left internal carotid artery. Yolanda microcatheter was further advanced into Yolanda distal left M1 segment, just proximal to Yolanda  bifurcation. This allowed advancement of Yolanda guide catheter into Yolanda proximal left M1. Yolanda 27 microcatheter was then removed Yolanda Yolanda 17 microcatheter was introduced over Yolanda microwire. Yolanda catheter was then navigated into Yolanda Boone lumen over Yolanda microwire. Yolanda wire was then removed. Yolanda above coils were then sequentially deployed with progressive exclusion of Yolanda Boone. Cerebral angiography was performed immediately after Yolanda coiling catheter was removed for embolization. Yolanda guide catheter withdrawn into Yolanda cervical internal carotid artery. Final control angiography was then performed from Yolanda guide catheter. At this point, Yolanda exchange length stiff Glidewire was introduced, Yolanda Yolanda guide catheter Yolanda sheath were exchanged for Yolanda JB 1 glide catheter. Yolanda diagnostic cerebral angiogram was completed with catheterization of Yolanda right internal carotid artery Yolanda left vertebral artery. Yolanda catheter  was then removed without incident. FINDINGS: Left internal carotid, head: Injection reveals Yolanda presence of a widely patent ICA, M1, Yolanda A1 segments Yolanda their branches. Incidental note is made of a fetal type left posterior cerebral artery. There is a somewhat irregular shaped Boone of Yolanda left middle cerebral artery bifurcation, better delineated on Yolanda three-dimensional rotational angiogram. Further oblique views do demonstrate Yolanda Boone measuring approximately 4 mm tall with an approximally 2 mm neck. Boone appears to be irregular shaped, with a "mitten" shape appearance. Also seen is another Boone of Yolanda left posterior communicating artery measuring approximally 2.3 mm tall by approximally 2.4 mm wide. Boone appears to arise at Yolanda origin of Yolanda left posterior communicating artery. No significant vasospasm is seen. Yolanda parenchymal Yolanda venous phases are normal. Yolanda venous sinuses are widely patent. Left internal carotid, 3D rotation 3-dimensional rotational angiographic images were reconstructed on  an independent workstation for review. These further delineate Yolanda above-described irregular-shaped left middle cerebral artery bifurcation Boone. Of note, there does appear to be a slight waist at Yolanda origin of Yolanda Boone suggesting a possible favorable dome to neck ratio for Boone coiling. Also seen is Yolanda above described left posterior communicating artery Boone projecting laterally Yolanda posteriorly. Right internal carotid, head: Injection reveals Yolanda presence of a widely patent ICA, A1, Yolanda M1 segments Yolanda their branches. Again seen is a fetal type right posterior cerebral artery. There is a smooth, regular appearing Boone projecting medially Yolanda inferiorly from Yolanda supraclinoid right internal carotid artery measuring approximally 3.6 mm tall by 4.3 mm wide. Boone appears to have a relatively wide neck. Also seen is a small Boone arising at Yolanda origin of Yolanda right posterior communicating artery measuring approximately 1.5 x 1.7 mm. Yolanda parenchymal Yolanda venous phases are normal. Yolanda venous sinuses are widely patent. Left vertebral: Injection reveals Yolanda presence of a widely patent vertebral artery. This leads to a widely patent basilar artery that terminates in bilateral superior cerebellar arteries. Yolanda bilateral posterior cerebral arteries are not visualized consistent with Yolanda presence of fetal type posterior cerebral arteries from Yolanda anterior circulation bilaterally. While Yolanda basilar artery appears to be relatively small, this is likely a result of Yolanda fetal type posterior circulation. No vasospasm is noted. Yolanda basilar apex is normal. No aneurysms, AVMs, or high-flow fistulas are seen. Yolanda parenchymal Yolanda venous phases are normal. Yolanda venous sinuses are widely patent. Left middle cerebral artery, head (pre embolization) : Injection reveals widely patent left middle cerebral artery Yolanda bifurcation. Yolanda above described Boone is again visualized, with good visualization of Yolanda neck of Yolanda  Boone. Left middle cerebral artery, head (during embolization): Injection reveals Yolanda presence of a widely patent M1, as well as patent MCA bifurcation Yolanda distal branches. Coil mass within Yolanda Boone is stable, without coil prolapse into Yolanda MCA, Yolanda no filling defect to suggest thrombus. Left middle cerebral artery, head (immediate post-embolization): Injection reveals Yolanda presence of a widely patent M1 Yolanda more distal MCA branches. Coil mass within Yolanda Boone is stable, without coil prolapse into Yolanda bifurcation. No filling defects are seen. There is minimal neck residual measuring at most 1 x 1 mm. Yolanda dome of Yolanda Boone appears to be occluded, with coil mass also within Yolanda small daughter sac. Left internal carotid artery, head (final control): Injection reveals Yolanda presence of a widely patent ICA that leads to a patent ACA Yolanda MCA. No thrombus is visualized. Coil mass is seen within Yolanda Boone Yolanda is in stable  position. Capillary phase does not demonstrate any branch occlusions or perfusion deficits. Venous sinuses remain widely patent. Right femoral: Normal vessel. No significant atherosclerotic disease. Arterial sheath in adequate position. DISPOSITION: Upon completion of Yolanda study, Yolanda femoral sheath was removed Yolanda hemostasis obtained using a 8-Fr Angio-Seal closure device. Good proximal Yolanda distal lower extremity pulses were documented upon achievement of hemostasis. Yolanda procedure was well tolerated Yolanda no early complications were observed. Yolanda patient was transferred to Yolanda neuro intensive care unit for further care. IMPRESSION: 1. Successful coil embolization of an irregular left middle cerebral artery Boone as Yolanda source of subarachnoid hemorrhage. Minimal neck residual is noted post coiling. 2. Incidental note of somewhat irregular left posterior communicating artery Boone measuring 2.4 mm in maximal dimension, as described above. 3. Incidental, smooth appearing right supraclinoid  internal carotid artery Boone measuring 4.3 mm in maximal dimension, as described above. 4. Incidental, small right posterior communicating artery Boone measuring 1.7 mm in maximal dimension, as described above. 5.  No significant intracranial vasospasm is noted. Yolanda preliminary results of this procedure were shared with Yolanda patient's family. Electronically Signed   By: Consuella Lose   On: 06/21/2021 11:12   IR Angiogram Follow Up Study  Result Date: 06/21/2021 PROCEDURE: DIAGNOSTIC CEREBRAL ANGIOGRAM COIL EMBOLIZATION OF LEFT MIDDLE CEREBRAL ARTERY Boone HISTORY: [facility]olanda patient is a 61 year old woman initially presented to [facility] hospital with decreased level of consciousness. She required intubation for airway protection. Initial CT scan demonstrated diffuse basal subarachnoid hemorrhage with thick left sylvian clot. CT angiogram confirmed Yolanda presence of a left middle cerebral artery Boone as Yolanda source of hemorrhage, with likely incidental right supraclinoid internal carotid artery Boone also identified. 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 ACCESS: Yolanda technical aspects of Yolanda procedure as well as its potential risks Yolanda benefits were reviewed with Yolanda patient's daughter. These risks included but were not limited to stroke, intracranial hemorrhage, bleeding, infection, allergic reaction, damage to organs or vital structures, stroke, non-diagnostic procedure, Yolanda Yolanda catastrophic outcomes of heart attack, coma, Yolanda death. With an understanding of these risks, informed consent was obtained Yolanda witnessed. Yolanda patient was placed in Yolanda supine position on Yolanda angiography table Yolanda Yolanda skin of right groin prepped in Yolanda usual sterile fashion. Yolanda procedure was performed under general anesthesia. A 5-French sheath was introduced in Yolanda right common femoral artery using Seldinger technique. MEDICATIONS:  HEPARIN: 0 Units total (exclusive of heparinized saline flush). CONTRAST:  67mL OMNIPAQUE IOHEXOL 300 MG/ML  SOLNcc, Omnipaque 300 FLUOROSCOPY TIME:  FLUOROSCOPY TIME: See IR records TECHNIQUE: CATHETERS Yolanda WIRES 90 cm pigtail catheter 5-French JB-1 catheter 180 cm 0.035" glidewire 280cm 035 stiff Glidewire 6-French NeuronMax guide sheath 0.058" CatV guidecatheter Excelsior XT 27 microcatheter Synchro select standard microwire Excelsior XT-17 microcatheter COILS USED Target 360 XL 2.5 mm x 3 cm Target XL nano 1.5 mm x 2 cm VESSELS CATHETERIZED Right internal carotid Left internal carotid Left vertebral Right common femoral Left middle cerebral artery VESSELS STUDIED Right internal carotid, head Left internal carotid, head Left internal carotid, three-dimensional rotational angiogram Left vertebral Left middle cerebral artery, head (pre embolization) Left middle cerebral artery, head (during embolization) Left middle cerebral artery, head (immediate post-embolization) Left internal carotid artery, head (final control) Right common femoral PROCEDURAL NARRATIVE Initially, Yolanda pigtail catheter was advanced into Yolanda distal descending aorta over Yolanda stiff Glidewire. Yolanda Glidewire was then removed Yolanda Yolanda pigtail was reformed. Yolanda  pigtail catheter was then gently advanced through Yolanda descending aorta Yolanda over Yolanda aortic arch. Catheter was noted to be in Yolanda true lumen of Yolanda aorta with partial visualization of Yolanda left subclavian origin. Yolanda pigtail catheter was further advanced into Yolanda aortic root, Yolanda stiff Glidewire was reintroduced, Yolanda Yolanda pigtail catheter was exchanged for Yolanda JB 1 glide catheter. Internal carotid artery was then catheterized Yolanda angiogram taken. Further three-dimensional angiogram was taken. 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 Yolanda exchange stiff Glidewire was introduced  into Yolanda left internal carotid artery, Yolanda a double exchange was performed, to replace Yolanda 5 [place] sheath with an 8 French sheath, while Yolanda neuron max was advanced into Yolanda distal left common carotid artery. Under roadmap guidance, Yolanda guide catheter was advanced over Yolanda 27 microcatheter into Yolanda left internal carotid artery. Yolanda microcatheter was further advanced into Yolanda distal left M1 segment, just proximal to Yolanda bifurcation. This allowed advancement of Yolanda guide catheter into Yolanda proximal left M1. Yolanda 27 microcatheter was then removed Yolanda Yolanda 17 microcatheter was introduced over Yolanda microwire. Yolanda catheter was then navigated into Yolanda Boone lumen over Yolanda microwire. Yolanda wire was then removed. Yolanda above coils were then sequentially deployed with progressive exclusion of Yolanda Boone. Cerebral angiography was performed immediately after Yolanda coiling catheter was removed for embolization. Yolanda guide catheter withdrawn into Yolanda cervical internal carotid artery. Final control angiography was then performed from Yolanda guide catheter. At this point, Yolanda exchange length stiff Glidewire was introduced, Yolanda Yolanda guide catheter Yolanda sheath were exchanged for Yolanda JB 1 glide catheter. Yolanda diagnostic cerebral angiogram was completed with catheterization of Yolanda right internal carotid artery Yolanda left vertebral artery. Yolanda catheter was then removed without incident. FINDINGS: Left internal carotid, head: Injection reveals Yolanda presence of a widely patent ICA, M1, Yolanda A1 segments Yolanda their branches. Incidental note is made of a fetal type left posterior cerebral artery. There is a somewhat irregular shaped Boone of Yolanda left middle cerebral artery bifurcation, better delineated on Yolanda three-dimensional rotational angiogram. Further oblique views do demonstrate Yolanda Boone measuring approximately 4 mm tall with an approximally 2 mm neck. Boone appears to be irregular shaped, with a "mitten" shape appearance. Also seen is  another Boone of Yolanda left posterior communicating artery measuring approximally 2.3 mm tall by approximally 2.4 mm wide. Boone appears to arise at Yolanda origin of Yolanda left posterior communicating artery. No significant vasospasm is seen. Yolanda parenchymal Yolanda venous phases are normal. Yolanda venous sinuses are widely patent. Left internal carotid, 3D rotation 3-dimensional rotational angiographic images were reconstructed on an independent workstation for review. These further delineate Yolanda above-described irregular-shaped left middle cerebral artery bifurcation Boone. Of note, there does appear to be a slight waist at Yolanda origin of Yolanda Boone suggesting a possible favorable dome to neck ratio for Boone coiling. Also seen is Yolanda above described left posterior communicating artery Boone projecting laterally Yolanda posteriorly. Right internal carotid, head: Injection reveals Yolanda presence of a widely patent ICA, A1, Yolanda M1 segments Yolanda their branches. Again seen is a fetal type right posterior cerebral artery. There is a smooth, regular appearing Boone projecting medially Yolanda inferiorly from Yolanda supraclinoid right internal carotid artery measuring approximally 3.6 mm tall by 4.3 mm wide. Boone appears to have a relatively wide neck. Also seen is a small Boone arising at Yolanda origin of  Yolanda right posterior communicating artery measuring approximately 1.5 x 1.7 mm. Yolanda parenchymal Yolanda venous phases are normal. Yolanda venous sinuses are widely patent. Left vertebral: Injection reveals Yolanda presence of a widely patent vertebral artery. This leads to a widely patent basilar artery that terminates in bilateral superior cerebellar arteries. Yolanda bilateral posterior cerebral arteries are not visualized consistent with Yolanda presence of fetal type posterior cerebral arteries from Yolanda anterior circulation bilaterally. While Yolanda basilar artery appears to be relatively small, this is likely a result of Yolanda fetal type  posterior circulation. No vasospasm is noted. Yolanda basilar apex is normal. No aneurysms, AVMs, or high-flow fistulas are seen. Yolanda parenchymal Yolanda venous phases are normal. Yolanda venous sinuses are widely patent. Left middle cerebral artery, head (pre embolization) : Injection reveals widely patent left middle cerebral artery Yolanda bifurcation. Yolanda above described Boone is again visualized, with good visualization of Yolanda neck of Yolanda Boone. Left middle cerebral artery, head (during embolization): Injection reveals Yolanda presence of a widely patent M1, as well as patent MCA bifurcation Yolanda distal branches. Coil mass within Yolanda Boone is stable, without coil prolapse into Yolanda MCA, Yolanda no filling defect to suggest thrombus. Left middle cerebral artery, head (immediate post-embolization): Injection reveals Yolanda presence of a widely patent M1 Yolanda more distal MCA branches. Coil mass within Yolanda Boone is stable, without coil prolapse into Yolanda bifurcation. No filling defects are seen. There is minimal neck residual measuring at most 1 x 1 mm. Yolanda dome of Yolanda Boone appears to be occluded, with coil mass also within Yolanda small daughter sac. Left internal carotid artery, head (final control): Injection reveals Yolanda presence of a widely patent ICA that leads to a patent ACA Yolanda MCA. No thrombus is visualized. Coil mass is seen within Yolanda Boone Yolanda is in stable position. Capillary phase does not demonstrate any branch occlusions or perfusion deficits. Venous sinuses remain widely patent. Right femoral: Normal vessel. No significant atherosclerotic disease. Arterial sheath in adequate position. DISPOSITION: Upon completion of Yolanda study, Yolanda femoral sheath was removed Yolanda hemostasis obtained using a 8-Fr Angio-Seal closure device. Good proximal Yolanda distal lower extremity pulses were documented upon achievement of hemostasis. Yolanda procedure was well tolerated Yolanda no early complications were observed. Yolanda patient was transferred  to Yolanda neuro intensive care unit for further care. IMPRESSION: 1. Successful coil embolization of an irregular left middle cerebral artery Boone as Yolanda source of subarachnoid hemorrhage. Minimal neck residual is noted post coiling. 2. Incidental note of somewhat irregular left posterior communicating artery Boone measuring 2.4 mm in maximal dimension, as described above. 3. Incidental, smooth appearing right supraclinoid internal carotid artery Boone measuring 4.3 mm in maximal dimension, as described above. 4. Incidental, small right posterior communicating artery Boone measuring 1.7 mm in maximal dimension, as described above. 5.  No significant intracranial vasospasm is noted. Yolanda preliminary results of this procedure were shared with Yolanda patient's family. Electronically Signed   By: Consuella Lose   On: 06/21/2021 11:12   IR Angiogram Follow Up Study  Result Date: 06/21/2021 PROCEDURE: DIAGNOSTIC CEREBRAL ANGIOGRAM COIL EMBOLIZATION OF LEFT MIDDLE CEREBRAL ARTERY Boone HISTORY: Yolanda patient is a 61 year old woman initially presented to [facility] hospital with decreased level of consciousness. She required intubation for airway protection. Initial CT scan demonstrated diffuse basal subarachnoid hemorrhage with thick left sylvian clot. CT angiogram confirmed Yolanda presence of a left middle cerebral artery Boone as Yolanda source of hemorrhage, with likely incidental right supraclinoid internal carotid artery Boone  also identified. 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 ACCESS: Yolanda technical aspects of Yolanda procedure as well as its potential risks Yolanda benefits were reviewed with Yolanda patient's daughter. These risks included but were not limited to stroke, intracranial hemorrhage, bleeding, infection, allergic reaction, damage to organs or vital structures, stroke, non-diagnostic procedure, Yolanda Yolanda  catastrophic outcomes of heart attack, coma, Yolanda death. With an understanding of these risks, informed consent was obtained Yolanda witnessed. Yolanda patient was placed in Yolanda supine position on Yolanda angiography table Yolanda Yolanda skin of right groin prepped in Yolanda usual sterile fashion. Yolanda procedure was performed under general anesthesia. A 5-French sheath was introduced in Yolanda right common femoral artery using Seldinger technique. MEDICATIONS: HEPARIN: 0 Units total (exclusive of heparinized saline flush). CONTRAST:  12mL OMNIPAQUE IOHEXOL 300 MG/ML  SOLNcc, Omnipaque 300 FLUOROSCOPY TIME:  FLUOROSCOPY TIME: See IR records TECHNIQUE: CATHETERS Yolanda WIRES 90 cm pigtail catheter 5-French JB-1 catheter 180 cm 0.035" glidewire 280cm 035 stiff Glidewire 6-French NeuronMax guide sheath 0.058" CatV guidecatheter Excelsior XT 27 microcatheter Synchro select standard microwire Excelsior XT-17 microcatheter COILS USED Target 360 XL 2.5 mm x 3 cm Target XL nano 1.5 mm x 2 cm VESSELS CATHETERIZED Right internal carotid Left internal carotid Left vertebral Right common femoral Left middle cerebral artery VESSELS STUDIED Right internal carotid, head Left internal carotid, head Left internal carotid, three-dimensional rotational angiogram Left vertebral Left middle cerebral artery, head (pre embolization) Left middle cerebral artery, head (during embolization) Left middle cerebral artery, head (immediate post-embolization) Left internal carotid artery, head (final control) Right common femoral PROCEDURAL NARRATIVE Initially, Yolanda pigtail catheter was advanced into Yolanda distal descending aorta over Yolanda stiff Glidewire. Yolanda Glidewire was then removed Yolanda Yolanda pigtail was reformed. Yolanda pigtail catheter was then gently advanced through Yolanda descending aorta Yolanda over Yolanda aortic arch. Catheter was noted to be in Yolanda true lumen of Yolanda aorta with partial visualization of Yolanda left subclavian origin. Yolanda pigtail catheter was further advanced into Yolanda  aortic root, Yolanda stiff Glidewire was reintroduced, Yolanda Yolanda pigtail catheter was exchanged for Yolanda JB 1 glide catheter. Internal carotid artery was then catheterized Yolanda angiogram taken. Further three-dimensional angiogram was taken. 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 Yolanda exchange stiff Glidewire was introduced into Yolanda left internal carotid artery, Yolanda a double exchange was performed, to replace Yolanda 5 [place] sheath with an 8 French sheath, while Yolanda neuron max was advanced into Yolanda distal left common carotid artery. Under roadmap guidance, Yolanda guide catheter was advanced over Yolanda 27 microcatheter into Yolanda left internal carotid artery. Yolanda microcatheter was further advanced into Yolanda distal left M1 segment, just proximal to Yolanda bifurcation. This allowed advancement of Yolanda guide catheter into Yolanda proximal left M1. Yolanda 27 microcatheter was then removed Yolanda Yolanda 17 microcatheter was introduced over Yolanda microwire. Yolanda catheter was then navigated into Yolanda Boone lumen over Yolanda microwire. Yolanda wire was then removed. Yolanda above coils were then sequentially deployed with progressive exclusion of Yolanda Boone. Cerebral angiography was performed immediately after Yolanda coiling catheter was removed for embolization. Yolanda guide catheter withdrawn into Yolanda cervical internal carotid artery. Final control angiography was then performed from Yolanda guide catheter. At this point, Yolanda exchange length stiff Glidewire was introduced, Yolanda Yolanda guide catheter Yolanda sheath were exchanged for Yolanda  JB 1 glide catheter. Yolanda diagnostic cerebral angiogram was completed with catheterization of Yolanda right internal carotid artery Yolanda left vertebral artery. Yolanda catheter was then removed without incident. FINDINGS: Left internal carotid, head: Injection reveals Yolanda presence of a widely patent ICA, M1, Yolanda A1 segments Yolanda their  branches. Incidental note is made of a fetal type left posterior cerebral artery. There is a somewhat irregular shaped Boone of Yolanda left middle cerebral artery bifurcation, better delineated on Yolanda three-dimensional rotational angiogram. Further oblique views do demonstrate Yolanda Boone measuring approximately 4 mm tall with an approximally 2 mm neck. Boone appears to be irregular shaped, with a "mitten" shape appearance. Also seen is another Boone of Yolanda left posterior communicating artery measuring approximally 2.3 mm tall by approximally 2.4 mm wide. Boone appears to arise at Yolanda origin of Yolanda left posterior communicating artery. No significant vasospasm is seen. Yolanda parenchymal Yolanda venous phases are normal. Yolanda venous sinuses are widely patent. Left internal carotid, 3D rotation 3-dimensional rotational angiographic images were reconstructed on an independent workstation for review. These further delineate Yolanda above-described irregular-shaped left middle cerebral artery bifurcation Boone. Of note, there does appear to be a slight waist at Yolanda origin of Yolanda Boone suggesting a possible favorable dome to neck ratio for Boone coiling. Also seen is Yolanda above described left posterior communicating artery Boone projecting laterally Yolanda posteriorly. Right internal carotid, head: Injection reveals Yolanda presence of a widely patent ICA, A1, Yolanda M1 segments Yolanda their branches. Again seen is a fetal type right posterior cerebral artery. There is a smooth, regular appearing Boone projecting medially Yolanda inferiorly from Yolanda supraclinoid right internal carotid artery measuring approximally 3.6 mm tall by 4.3 mm wide. Boone appears to have a relatively wide neck. Also seen is a small Boone arising at Yolanda origin of Yolanda right posterior communicating artery measuring approximately 1.5 x 1.7 mm. Yolanda parenchymal Yolanda venous phases are normal. Yolanda venous sinuses are widely patent. Left vertebral:  Injection reveals Yolanda presence of a widely patent vertebral artery. This leads to a widely patent basilar artery that terminates in bilateral superior cerebellar arteries. Yolanda bilateral posterior cerebral arteries are not visualized consistent with Yolanda presence of fetal type posterior cerebral arteries from Yolanda anterior circulation bilaterally. While Yolanda basilar artery appears to be relatively small, this is likely a result of Yolanda fetal type posterior circulation. No vasospasm is noted. Yolanda basilar apex is normal. No aneurysms, AVMs, or high-flow fistulas are seen. Yolanda parenchymal Yolanda venous phases are normal. Yolanda venous sinuses are widely patent. Left middle cerebral artery, head (pre embolization) : Injection reveals widely patent left middle cerebral artery Yolanda bifurcation. Yolanda above described Boone is again visualized, with good visualization of Yolanda neck of Yolanda Boone. Left middle cerebral artery, head (during embolization): Injection reveals Yolanda presence of a widely patent M1, as well as patent MCA bifurcation Yolanda distal branches. Coil mass within Yolanda Boone is stable, without coil prolapse into Yolanda MCA, Yolanda no filling defect to suggest thrombus. Left middle cerebral artery, head (immediate post-embolization): Injection reveals Yolanda presence of a widely patent M1 Yolanda more distal MCA branches. Coil mass within Yolanda Boone is stable, without coil prolapse into Yolanda bifurcation. No filling defects are seen. There is minimal neck residual measuring at most 1 x 1 mm. Yolanda dome of Yolanda Boone appears to be occluded, with coil mass also within Yolanda small daughter sac. Left internal carotid artery, head (final control): Injection reveals Yolanda presence of a widely patent  ICA that leads to a patent ACA Yolanda MCA. No thrombus is visualized. Coil mass is seen within Yolanda Boone Yolanda is in stable position. Capillary phase does not demonstrate any branch occlusions or perfusion deficits. Venous sinuses remain widely  patent. Right femoral: Normal vessel. No significant atherosclerotic disease. Arterial sheath in adequate position. DISPOSITION: Upon completion of Yolanda study, Yolanda femoral sheath was removed Yolanda hemostasis obtained using a 8-Fr Angio-Seal closure device. Good proximal Yolanda distal lower extremity pulses were documented upon achievement of hemostasis. Yolanda procedure was well tolerated Yolanda no early complications were observed. Yolanda patient was transferred to Yolanda neuro intensive care unit for further care. IMPRESSION: 1. Successful coil embolization of an irregular left middle cerebral artery Boone as Yolanda source of subarachnoid hemorrhage. Minimal neck residual is noted post coiling. 2. Incidental note of somewhat irregular left posterior communicating artery Boone measuring 2.4 mm in maximal dimension, as described above. 3. Incidental, smooth appearing right supraclinoid internal carotid artery Boone measuring 4.3 mm in maximal dimension, as described above. 4. Incidental, small right posterior communicating artery Boone measuring 1.7 mm in maximal dimension, as described above. 5.  No significant intracranial vasospasm is noted. Yolanda preliminary results of this procedure were shared with Yolanda patient's family. Electronically Signed   By: Consuella Lose   On: 06/21/2021 11:12   IR 3D Independent Darreld Mclean  Result Date: 06/21/2021 PROCEDURE: DIAGNOSTIC CEREBRAL ANGIOGRAM COIL EMBOLIZATION OF LEFT MIDDLE CEREBRAL ARTERY Boone HISTORY: Yolanda patient is a 61 year old woman initially presented to [facility]anda hospital with decreased level of consciousness. She required intubation for airway protection. Initial CT scan demonstrated diffuse basal subarachnoid hemorrhage with thick left sylvian clot. CT angiogram confirmed Yolanda presence of a left middle cerebral artery Boone as Yolanda source of hemorrhage, with likely incidental right supraclinoid internal carotid artery Boone also identified. 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 ACCESS: Yolanda technical aspects of Yolanda procedure as well as its potential risks Yolanda benefits were reviewed with Yolanda patient's daughter. These risks included but were not limited to stroke, intracranial hemorrhage, bleeding, infection, allergic reaction, damage to organs or vital structures, stroke, non-diagnostic procedure, Yolanda Yolanda catastrophic outcomes of heart attack, coma, Yolanda death. With an understanding of these risks, informed consent was obtained Yolanda witnessed. Yolanda patient was placed in Yolanda supine position on Yolanda angiography table Yolanda Yolanda skin of right groin prepped in Yolanda usual sterile fashion. Yolanda procedure was performed under general anesthesia. A 5-French sheath was introduced in Yolanda right common femoral artery using Seldinger technique. MEDICATIONS: HEPARIN: 0 Units total (exclusive of heparinized saline flush). CONTRAST:  51mL OMNIPAQUE IOHEXOL 300 MG/ML  SOLNcc, Omnipaque 300 FLUOROSCOPY TIME:  FLUOROSCOPY TIME: See IR records TECHNIQUE: CATHETERS Yolanda WIRES 90 cm pigtail catheter 5-French JB-1 catheter 180 cm 0.035" glidewire 280cm 035 stiff Glidewire 6-French NeuronMax guide sheath 0.058" CatV guidecatheter Excelsior XT 27 microcatheter Synchro select standard microwire Excelsior XT-17 microcatheter COILS USED Target 360 XL 2.5 mm x 3 cm Target XL nano 1.5 mm x 2 cm VESSELS CATHETERIZED Right internal carotid Left internal carotid Left vertebral Right common femoral Left middle cerebral artery VESSELS STUDIED Right internal carotid, head Left internal carotid, head Left internal carotid, three-dimensional rotational angiogram Left vertebral Left middle cerebral artery, head (pre embolization) Left middle cerebral artery, head (during embolization) Left middle cerebral artery, head (immediate post-embolization) Left internal carotid artery, head (final control) Right common femoral PROCEDURAL  NARRATIVE Initially, Yolanda  pigtail catheter was advanced into Yolanda distal descending aorta over Yolanda stiff Glidewire. Yolanda Glidewire was then removed Yolanda Yolanda pigtail was reformed. Yolanda pigtail catheter was then gently advanced through Yolanda descending aorta Yolanda over Yolanda aortic arch. Catheter was noted to be in Yolanda true lumen of Yolanda aorta with partial visualization of Yolanda left subclavian origin. Yolanda pigtail catheter was further advanced into Yolanda aortic root, Yolanda stiff Glidewire was reintroduced, Yolanda Yolanda pigtail catheter was exchanged for Yolanda JB 1 glide catheter. Internal carotid artery was then catheterized Yolanda angiogram taken. Further three-dimensional angiogram was taken. 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 Yolanda exchange stiff Glidewire was introduced into Yolanda left internal carotid artery, Yolanda a double exchange was performed, to replace Yolanda 5 [place] sheath with an 8 French sheath, while Yolanda neuron max was advanced into Yolanda distal left common carotid artery. Under roadmap guidance, Yolanda guide catheter was advanced over Yolanda 27 microcatheter into Yolanda left internal carotid artery. Yolanda microcatheter was further advanced into Yolanda distal left M1 segment, just proximal to Yolanda bifurcation. This allowed advancement of Yolanda guide catheter into Yolanda proximal left M1. Yolanda 27 microcatheter was then removed Yolanda Yolanda 17 microcatheter was introduced over Yolanda microwire. Yolanda catheter was then navigated into Yolanda Boone lumen over Yolanda microwire. Yolanda wire was then removed. Yolanda above coils were then sequentially deployed with progressive exclusion of Yolanda Boone. Cerebral angiography was performed immediately after Yolanda coiling catheter was removed for embolization. Yolanda guide catheter withdrawn into Yolanda cervical internal carotid artery. Final control angiography was then performed from Yolanda guide catheter. At this point,  Yolanda exchange length stiff Glidewire was introduced, Yolanda Yolanda guide catheter Yolanda sheath were exchanged for Yolanda JB 1 glide catheter. Yolanda diagnostic cerebral angiogram was completed with catheterization of Yolanda right internal carotid artery Yolanda left vertebral artery. Yolanda catheter was then removed without incident. FINDINGS: Left internal carotid, head: Injection reveals Yolanda presence of a widely patent ICA, M1, Yolanda A1 segments Yolanda their branches. Incidental note is made of a fetal type left posterior cerebral artery. There is a somewhat irregular shaped Boone of Yolanda left middle cerebral artery bifurcation, better delineated on Yolanda three-dimensional rotational angiogram. Further oblique views do demonstrate Yolanda Boone measuring approximately 4 mm tall with an approximally 2 mm neck. Boone appears to be irregular shaped, with a "mitten" shape appearance. Also seen is another Boone of Yolanda left posterior communicating artery measuring approximally 2.3 mm tall by approximally 2.4 mm wide. Boone appears to arise at Yolanda origin of Yolanda left posterior communicating artery. No significant vasospasm is seen. Yolanda parenchymal Yolanda venous phases are normal. Yolanda venous sinuses are widely patent. Left internal carotid, 3D rotation 3-dimensional rotational angiographic images were reconstructed on an independent workstation for review. These further delineate Yolanda above-described irregular-shaped left middle cerebral artery bifurcation Boone. Of note, there does appear to be a slight waist at Yolanda origin of Yolanda Boone suggesting a possible favorable dome to neck ratio for Boone coiling. Also seen is Yolanda above described left posterior communicating artery Boone projecting laterally Yolanda posteriorly. Right internal carotid, head: Injection reveals Yolanda presence of a widely patent ICA, A1, Yolanda M1 segments Yolanda their branches. Again seen is a fetal type right posterior cerebral artery. There is a smooth, regular appearing  Boone projecting medially Yolanda inferiorly from Yolanda supraclinoid right internal carotid artery measuring approximally 3.6 mm  tall by 4.3 mm wide. Boone appears to have a relatively wide neck. Also seen is a small Boone arising at Yolanda origin of Yolanda right posterior communicating artery measuring approximately 1.5 x 1.7 mm. Yolanda parenchymal Yolanda venous phases are normal. Yolanda venous sinuses are widely patent. Left vertebral: Injection reveals Yolanda presence of a widely patent vertebral artery. This leads to a widely patent basilar artery that terminates in bilateral superior cerebellar arteries. Yolanda bilateral posterior cerebral arteries are not visualized consistent with Yolanda presence of fetal type posterior cerebral arteries from Yolanda anterior circulation bilaterally. While Yolanda basilar artery appears to be relatively small, this is likely a result of Yolanda fetal type posterior circulation. No vasospasm is noted. Yolanda basilar apex is normal. No aneurysms, AVMs, or high-flow fistulas are seen. Yolanda parenchymal Yolanda venous phases are normal. Yolanda venous sinuses are widely patent. Left middle cerebral artery, head (pre embolization) : Injection reveals widely patent left middle cerebral artery Yolanda bifurcation. Yolanda above described Boone is again visualized, with good visualization of Yolanda neck of Yolanda Boone. Left middle cerebral artery, head (during embolization): Injection reveals Yolanda presence of a widely patent M1, as well as patent MCA bifurcation Yolanda distal branches. Coil mass within Yolanda Boone is stable, without coil prolapse into Yolanda MCA, Yolanda no filling defect to suggest thrombus. Left middle cerebral artery, head (immediate post-embolization): Injection reveals Yolanda presence of a widely patent M1 Yolanda more distal MCA branches. Coil mass within Yolanda Boone is stable, without coil prolapse into Yolanda bifurcation. No filling defects are seen. There is minimal neck residual measuring at most 1 x 1 mm. Yolanda dome of Yolanda  Boone appears to be occluded, with coil mass also within Yolanda small daughter sac. Left internal carotid artery, head (final control): Injection reveals Yolanda presence of a widely patent ICA that leads to a patent ACA Yolanda MCA. No thrombus is visualized. Coil mass is seen within Yolanda Boone Yolanda is in stable position. Capillary phase does not demonstrate any branch occlusions or perfusion deficits. Venous sinuses remain widely patent. Right femoral: Normal vessel. No significant atherosclerotic disease. Arterial sheath in adequate position. DISPOSITION: Upon completion of Yolanda study, Yolanda femoral sheath was removed Yolanda hemostasis obtained using a 8-Fr Angio-Seal closure device. Good proximal Yolanda distal lower extremity pulses were documented upon achievement of hemostasis. Yolanda procedure was well tolerated Yolanda no early complications were observed. Yolanda patient was transferred to Yolanda neuro intensive care unit for further care. IMPRESSION: 1. Successful coil embolization of an irregular left middle cerebral artery Boone as Yolanda source of subarachnoid hemorrhage. Minimal neck residual is noted post coiling. 2. Incidental note of somewhat irregular left posterior communicating artery Boone measuring 2.4 mm in maximal dimension, as described above. 3. Incidental, smooth appearing right supraclinoid internal carotid artery Boone measuring 4.3 mm in maximal dimension, as described above. 4. Incidental, small right posterior communicating artery Boone measuring 1.7 mm in maximal dimension, as described above. 5.  No significant intracranial vasospasm is noted. Yolanda preliminary results of this procedure were shared with Yolanda patient's family. Electronically Signed   By: Consuella Lose   On: 06/21/2021 11:12   DG Chest Portable 1 View  Result Date: 06/20/2021 CLINICAL DATA:  Intubation EXAM: PORTABLE CHEST 1 VIEW COMPARISON:  None Available. FINDINGS: Endotracheal tube at Yolanda carina.  Withdrawal 3 cm is suggested.  Possible mild central left upper lobe opacity, equivocal. This could reflect mild aspiration. Lungs are otherwise clear No pleural effusion or pneumothorax. Yolanda heart is  normal in size. IMPRESSION: Endotracheal tube at Yolanda carina. Withdrawal 3 cm is suggested. Possible mild central left upper lobe opacity, equivocal. This could reflect mild aspiration. Electronically Signed   By: Julian Hy M.D.   On: 06/20/2021 18:48   DG Abd Portable 1 View  Result Date: 06/20/2021 CLINICAL DATA:  Enteric catheter placement EXAM: PORTABLE ABDOMEN - 1 VIEW COMPARISON:  06/20/2021 FINDINGS: Supine frontal views of Yolanda lower chest Yolanda abdomen are obtained. Enteric catheter passes below diaphragm tip Yolanda side port projecting over Yolanda gastric fundus. Bowel gas pattern is unremarkable. Lung bases are clear. IMPRESSION: 1. Enteric catheter tip Yolanda side port projecting over Yolanda gastric fundus. Electronically Signed   By: Randa Ngo M.D.   On: 06/20/2021 22:52   DG Abd Portable 1 View  Result Date: 06/20/2021 CLINICAL DATA:  OG tube placement. EXAM: PORTABLE ABDOMEN - 1 VIEW COMPARISON:  None Available. FINDINGS: Orogastric tube tip is in Yolanda proximal stomach with side hole at Yolanda level of Yolanda distal esophagus. No dilated bowel loops in Yolanda upper abdomen. IMPRESSION: 1. Orogastric tube tip in Yolanda proximal stomach with side hole at Yolanda level of Yolanda distal esophagus. Recommend advancing tube 7 cm. Electronically Signed   By: Ronney Asters M.D.   On: 06/20/2021 20:45   CT HEAD CODE STROKE WO CONTRAST  Result Date: 06/20/2021 CLINICAL DATA:  Code stroke.  Neuro deficit, acute, stroke suspected EXAM: CT HEAD WITHOUT CONTRAST TECHNIQUE: Contiguous axial images were obtained from Yolanda base of Yolanda skull through Yolanda vertex without intravenous contrast. RADIATION DOSE REDUCTION: This exam was performed according to Yolanda departmental dose-optimization program which includes automated exposure control, adjustment of the mA Yolanda/or  kV according to patient size Yolanda/or use of iterative reconstruction technique. COMPARISON:  None Available. FINDINGS: Brain: Large volume subarachnoid hemorrhage, greatest within Yolanda left sylvian fissure. Hemorrhage also extends into Yolanda suprasellar cistern, basal cisterns, right sylvian fissure Yolanda along Yolanda falx. Hemorrhage results in effacement of Yolanda left lateral ventricle Yolanda approximately 4 mm of rightward midline shift. No evidence of acute large vascular territory infarct, mass lesion, or hydrocephalus. Vascular: Nondiagnostic evaluation for hyperdense vessel due to Yolanda hemorrhage. Filling possible defect in Yolanda region of Yolanda left MCA bifurcation. Skull: No acute fracture. Sinuses/Orbits: Clear visualized sinuses. No acute orbital findings. IMPRESSION: 1. Large volume of acute subarachnoid hemorrhage, described above Yolanda greatest in Yolanda left sylvian fissure with possible rounded filling defect in Yolanda region of Yolanda left MCA bifurcation. Recommend emergent CTA to assess for ruptured Boone (possibly left MCA bifurcation). 2. Resulting mass effect with approximately 4 mm of rightward midline shift. Findings Yolanda recommendations discussed with Dr. Theda Sers via telephone at 6:18 PM. Electronically Signed   By: Margaretha Sheffield M.D.   On: 06/20/2021 18:21   CT ANGIO HEAD NECK W WO CM (CODE STROKE)  Result Date: 06/20/2021 CLINICAL DATA:  Code stroke. EXAM: CT ANGIOGRAPHY HEAD Yolanda NECK TECHNIQUE: Multidetector CT imaging of Yolanda head Yolanda neck was performed using Yolanda standard protocol during bolus administration of intravenous contrast. Multiplanar CT image reconstructions Yolanda MIPs were obtained to evaluate Yolanda vascular anatomy. Carotid stenosis measurements (when applicable) are obtained utilizing NASCET criteria, using Yolanda distal internal carotid diameter as Yolanda denominator. RADIATION DOSE REDUCTION: This exam was performed according to Yolanda departmental dose-optimization program which includes automated  exposure control, adjustment of the mA Yolanda/or kV according to patient size Yolanda/or use of iterative reconstruction technique. CONTRAST:  37mL OMNIPAQUE IOHEXOL 350 MG/ML SOLN COMPARISON:  CT head from Yolanda same day. FINDINGS: CTA NECK FINDINGS Aortic arch: Great vessel origins are patent. 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 Possible outpouching of Yolanda imaged distal aortic arch Yolanda proximal descending aorta with linear filling defect. Right carotid system: No evidence of dissection, stenosis (50% or greater) or occlusion. Left carotid system: No evidence of dissection, stenosis (50% or greater) or occlusion. Vertebral arteries: Limited visualization due to streak artifact from pooled contrast. Yolanda left vertebral artery is dominant. Yolanda right vertebral artery is small throughout its course. No visible high-grade stenosis. Skeleton: No evidence of acute fracture. Other neck: Mild edema in Yolanda lower neck about Yolanda thyroid. Upper chest: Patchy opacities in Yolanda visualized upper lobes bilaterally. Review of Yolanda MIP images confirms Yolanda above findings CTA HEAD FINDINGS Anterior circulation: Bilateral intracranial ICAs are patent. Small/diminutive M1 MCAs Yolanda ACAs bilaterally, which are patent. Irregular/lobulated 5 x 2 mm outpouching arising from Yolanda left MCA bifurcation, compatible with ruptured Boone. Additional 5 x 4 mm medially directed right supraclinoid ICA Boone. Posterior circulation: Right/non dominant vertebral artery appears to terminate as PICA. Left vertebral artery Yolanda basilar artery are patent. Vertebrobasilar system is small/diminutive with bilateral fetal type PCAs, anatomic variant. Bilateral PCAs are patent Yolanda small. Venous sinuses: As permitted by contrast timing, patent. Anatomic variants: Detailed above. Review of Yolanda MIP images confirms Yolanda above findings IMPRESSION: 1. 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 Recommend urgent neurointerventional consultation. 2. Additional 5 x 4 mm medially directed right supraclinoid ICA Boone. 3. No emergent large vessel occlusion. Many of Yolanda intracranial arteries are diminutive, which could be congenital Yolanda/or related to vasospasm. 4. Possible outpouching of Yolanda imaged distal aortic arch Yolanda proximal descending aorta with linear filling defect. This is indeterminate Yolanda incompletely imaged, but may represent chronic, fenestrated dissection or Boone at Yolanda origin of an aberrant subclavian artery. Recommend follow-up CTA of Yolanda chest to further evaluate. 5. Mild edema in Yolanda lower neck about Yolanda thyroid. This is nonspecific but could be traumatic related to recent emergent intubation. A follow-up CT of Yolanda neck could further evaluate if clinically warranted. 6. Patchy opacities in Yolanda upper lobes bilaterally, possibly aspiration. Consider correlation with dedicated chest imaging. Emergent findings discussed with Dr. Theda Sers via telephone at 6:40 p.m. Electronically Signed   By: Margaretha Sheffield M.D.   On: 06/20/2021 19:00   IR ANGIO INTRA EXTRACRAN SEL INTERNAL CAROTID BILAT MOD SED  Result Date: 06/21/2021 PROCEDURE: DIAGNOSTIC CEREBRAL ANGIOGRAM COIL EMBOLIZATION OF LEFT MIDDLE CEREBRAL ARTERY Boone HISTORY: [facility]anda patient is a 61 year old woman initially presented to [facility] hospital with decreased level of consciousness. She required intubation for airway protection. Initial CT scan demonstrated diffuse basal subarachnoid hemorrhage with thick left sylvian clot. CT angiogram confirmed Yolanda presence of a left middle cerebral artery Boone as Yolanda source of hemorrhage, with likely incidental right supraclinoid internal carotid artery Boone also identified. 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 ACCESS: Yolanda technical aspects of Yolanda procedure as well as its  potential risks Yolanda benefits were reviewed with Yolanda patient's daughter. These risks included but were not limited to stroke, intracranial hemorrhage, bleeding, infection, allergic reaction, damage to organs or vital structures, stroke, non-diagnostic procedure, Yolanda Yolanda catastrophic outcomes of heart attack, coma, Yolanda death. With an understanding of these risks, informed consent was obtained Yolanda witnessed. Yolanda patient was placed  in Yolanda supine position on Yolanda angiography table Yolanda Yolanda skin of right groin prepped in Yolanda usual sterile fashion. Yolanda procedure was performed under general anesthesia. A 5-French sheath was introduced in Yolanda right common femoral artery using Seldinger technique. MEDICATIONS: HEPARIN: 0 Units total (exclusive of heparinized saline flush). CONTRAST:  49mL OMNIPAQUE IOHEXOL 300 MG/ML  SOLNcc, Omnipaque 300 FLUOROSCOPY TIME:  FLUOROSCOPY TIME: See IR records TECHNIQUE: CATHETERS Yolanda WIRES 90 cm pigtail catheter 5-French JB-1 catheter 180 cm 0.035" glidewire 280cm 035 stiff Glidewire 6-French NeuronMax guide sheath 0.058" CatV guidecatheter Excelsior XT 27 microcatheter Synchro select standard microwire Excelsior XT-17 microcatheter COILS USED Target 360 XL 2.5 mm x 3 cm Target XL nano 1.5 mm x 2 cm VESSELS CATHETERIZED Right internal carotid Left internal carotid Left vertebral Right common femoral Left middle cerebral artery VESSELS STUDIED Right internal carotid, head Left internal carotid, head Left internal carotid, three-dimensional rotational angiogram Left vertebral Left middle cerebral artery, head (pre embolization) Left middle cerebral artery, head (during embolization) Left middle cerebral artery, head (immediate post-embolization) Left internal carotid artery, head (final control) Right common femoral PROCEDURAL NARRATIVE Initially, Yolanda pigtail catheter was advanced into Yolanda distal descending aorta over Yolanda stiff Glidewire. Yolanda Glidewire was then removed Yolanda Yolanda pigtail was reformed.  Yolanda pigtail catheter was then gently advanced through Yolanda descending aorta Yolanda over Yolanda aortic arch. Catheter was noted to be in Yolanda true lumen of Yolanda aorta with partial visualization of Yolanda left subclavian origin. Yolanda pigtail catheter was further advanced into Yolanda aortic root, Yolanda stiff Glidewire was reintroduced, Yolanda Yolanda pigtail catheter was exchanged for Yolanda JB 1 glide catheter. Internal carotid artery was then catheterized Yolanda angiogram taken. Further three-dimensional angiogram was taken. 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 Yolanda exchange stiff Glidewire was introduced into Yolanda left internal carotid artery, Yolanda a double exchange was performed, to replace Yolanda 5 [place] sheath with an 8 French sheath, while Yolanda neuron max was advanced into Yolanda distal left common carotid artery. Under roadmap guidance, Yolanda guide catheter was advanced over Yolanda 27 microcatheter into Yolanda left internal carotid artery. Yolanda microcatheter was further advanced into Yolanda distal left M1 segment, just proximal to Yolanda bifurcation. This allowed advancement of Yolanda guide catheter into Yolanda proximal left M1. Yolanda 27 microcatheter was then removed Yolanda Yolanda 17 microcatheter was introduced over Yolanda microwire. Yolanda catheter was then navigated into Yolanda Boone lumen over Yolanda microwire. Yolanda wire was then removed. Yolanda above coils were then sequentially deployed with progressive exclusion of Yolanda Boone. Cerebral angiography was performed immediately after Yolanda coiling catheter was removed for embolization. Yolanda guide catheter withdrawn into Yolanda cervical internal carotid artery. Final control angiography was then performed from Yolanda guide catheter. At this point, Yolanda exchange length stiff Glidewire was introduced, Yolanda Yolanda guide catheter Yolanda sheath were exchanged for Yolanda JB 1 glide catheter. Yolanda diagnostic cerebral angiogram was  completed with catheterization of Yolanda right internal carotid artery Yolanda left vertebral artery. Yolanda catheter was then removed without incident. FINDINGS: Left internal carotid, head: Injection reveals Yolanda presence of a widely patent ICA, M1, Yolanda A1 segments Yolanda their branches. Incidental note is made of a fetal type left posterior cerebral artery. There is a somewhat irregular shaped Boone of Yolanda left middle cerebral artery bifurcation, better delineated on Yolanda three-dimensional rotational angiogram. Further oblique views do demonstrate Yolanda Boone measuring approximately  4 mm tall with an approximally 2 mm neck. Boone appears to be irregular shaped, with a "mitten" shape appearance. Also seen is another Boone of Yolanda left posterior communicating artery measuring approximally 2.3 mm tall by approximally 2.4 mm wide. Boone appears to arise at Yolanda origin of Yolanda left posterior communicating artery. No significant vasospasm is seen. Yolanda parenchymal Yolanda venous phases are normal. Yolanda venous sinuses are widely patent. Left internal carotid, 3D rotation 3-dimensional rotational angiographic images were reconstructed on an independent workstation for review. These further delineate Yolanda above-described irregular-shaped left middle cerebral artery bifurcation Boone. Of note, there does appear to be a slight waist at Yolanda origin of Yolanda Boone suggesting a possible favorable dome to neck ratio for Boone coiling. Also seen is Yolanda above described left posterior communicating artery Boone projecting laterally Yolanda posteriorly. Right internal carotid, head: Injection reveals Yolanda presence of a widely patent ICA, A1, Yolanda M1 segments Yolanda their branches. Again seen is a fetal type right posterior cerebral artery. There is a smooth, regular appearing Boone projecting medially Yolanda inferiorly from Yolanda supraclinoid right internal carotid artery measuring approximally 3.6 mm tall by 4.3 mm wide. Boone appears to  have a relatively wide neck. Also seen is a small Boone arising at Yolanda origin of Yolanda right posterior communicating artery measuring approximately 1.5 x 1.7 mm. Yolanda parenchymal Yolanda venous phases are normal. Yolanda venous sinuses are widely patent. Left vertebral: Injection reveals Yolanda presence of a widely patent vertebral artery. This leads to a widely patent basilar artery that terminates in bilateral superior cerebellar arteries. Yolanda bilateral posterior cerebral arteries are not visualized consistent with Yolanda presence of fetal type posterior cerebral arteries from Yolanda anterior circulation bilaterally. While Yolanda basilar artery appears to be relatively small, this is likely a result of Yolanda fetal type posterior circulation. No vasospasm is noted. Yolanda basilar apex is normal. No aneurysms, AVMs, or high-flow fistulas are seen. Yolanda parenchymal Yolanda venous phases are normal. Yolanda venous sinuses are widely patent. Left middle cerebral artery, head (pre embolization) : Injection reveals widely patent left middle cerebral artery Yolanda bifurcation. Yolanda above described Boone is again visualized, with good visualization of Yolanda neck of Yolanda Boone. Left middle cerebral artery, head (during embolization): Injection reveals Yolanda presence of a widely patent M1, as well as patent MCA bifurcation Yolanda distal branches. Coil mass within Yolanda Boone is stable, without coil prolapse into Yolanda MCA, Yolanda no filling defect to suggest thrombus. Left middle cerebral artery, head (immediate post-embolization): Injection reveals Yolanda presence of a widely patent M1 Yolanda more distal MCA branches. Coil mass within Yolanda Boone is stable, without coil prolapse into Yolanda bifurcation. No filling defects are seen. There is minimal neck residual measuring at most 1 x 1 mm. Yolanda dome of Yolanda Boone appears to be occluded, with coil mass also within Yolanda small daughter sac. Left internal carotid artery, head (final control): Injection reveals Yolanda presence of a  widely patent ICA that leads to a patent ACA Yolanda MCA. No thrombus is visualized. Coil mass is seen within Yolanda Boone Yolanda is in stable position. Capillary phase does not demonstrate any branch occlusions or perfusion deficits. Venous sinuses remain widely patent. Right femoral: Normal vessel. No significant atherosclerotic disease. Arterial sheath in adequate position. DISPOSITION: Upon completion of Yolanda study, Yolanda femoral sheath was removed Yolanda hemostasis obtained using a 8-Fr Angio-Seal closure device. Good proximal Yolanda distal lower extremity pulses were documented upon achievement of hemostasis. Yolanda procedure was well tolerated  Yolanda no early complications were observed. Yolanda patient was transferred to Yolanda neuro intensive care unit for further care. IMPRESSION: 1. Successful coil embolization of an irregular left middle cerebral artery Boone as Yolanda source of subarachnoid hemorrhage. Minimal neck residual is noted post coiling. 2. Incidental note of somewhat irregular left posterior communicating artery Boone measuring 2.4 mm in maximal dimension, as described above. 3. Incidental, smooth appearing right supraclinoid internal carotid artery Boone measuring 4.3 mm in maximal dimension, as described above. 4. Incidental, small right posterior communicating artery Boone measuring 1.7 mm in maximal dimension, as described above. 5.  No significant intracranial vasospasm is noted. Yolanda preliminary results of this procedure were shared with Yolanda patient's family. Electronically Signed   By: Consuella Lose   On: 06/21/2021 11:12   IR ANGIO VERTEBRAL SEL VERTEBRAL UNI L MOD SED  Result Date: 06/21/2021 PROCEDURE: DIAGNOSTIC CEREBRAL ANGIOGRAM COIL EMBOLIZATION OF LEFT MIDDLE CEREBRAL ARTERY Boone HISTORY: [facility]anda patient is a 61 year old woman initially presented to [facility] hospital with decreased level of consciousness. She required intubation for airway protection. Initial CT scan demonstrated diffuse basal  subarachnoid hemorrhage with thick left sylvian clot. CT angiogram confirmed Yolanda presence of a left middle cerebral artery Boone as Yolanda source of hemorrhage, with likely incidental right supraclinoid internal carotid artery Boone also identified. 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 ACCESS: Yolanda technical aspects of Yolanda procedure as well as its potential risks Yolanda benefits were reviewed with Yolanda patient's daughter. These risks included but were not limited to stroke, intracranial hemorrhage, bleeding, infection, allergic reaction, damage to organs or vital structures, stroke, non-diagnostic procedure, Yolanda Yolanda catastrophic outcomes of heart attack, coma, Yolanda death. With an understanding of these risks, informed consent was obtained Yolanda witnessed. Yolanda patient was placed in Yolanda supine position on Yolanda angiography table Yolanda Yolanda skin of right groin prepped in Yolanda usual sterile fashion. Yolanda procedure was performed under general anesthesia. A 5-French sheath was introduced in Yolanda right common femoral artery using Seldinger technique. MEDICATIONS: HEPARIN: 0 Units total (exclusive of heparinized saline flush). CONTRAST:  58mL OMNIPAQUE IOHEXOL 300 MG/ML  SOLNcc, Omnipaque 300 FLUOROSCOPY TIME:  FLUOROSCOPY TIME: See IR records TECHNIQUE: CATHETERS Yolanda WIRES 90 cm pigtail catheter 5-French JB-1 catheter 180 cm 0.035" glidewire 280cm 035 stiff Glidewire 6-French NeuronMax guide sheath 0.058" CatV guidecatheter Excelsior XT 27 microcatheter Synchro select standard microwire Excelsior XT-17 microcatheter COILS USED Target 360 XL 2.5 mm x 3 cm Target XL nano 1.5 mm x 2 cm VESSELS CATHETERIZED Right internal carotid Left internal carotid Left vertebral Right common femoral Left middle cerebral artery VESSELS STUDIED Right internal carotid, head Left internal carotid, head Left internal carotid, three-dimensional rotational  angiogram Left vertebral Left middle cerebral artery, head (pre embolization) Left middle cerebral artery, head (during embolization) Left middle cerebral artery, head (immediate post-embolization) Left internal carotid artery, head (final control) Right common femoral PROCEDURAL NARRATIVE Initially, Yolanda pigtail catheter was advanced into Yolanda distal descending aorta over Yolanda stiff Glidewire. Yolanda Glidewire was then removed Yolanda Yolanda pigtail was reformed. Yolanda pigtail catheter was then gently advanced through Yolanda descending aorta Yolanda over Yolanda aortic arch. Catheter was noted to be in Yolanda true lumen of Yolanda aorta with partial visualization of Yolanda left subclavian origin. Yolanda pigtail catheter was further advanced into Yolanda aortic root, Yolanda stiff Glidewire was reintroduced, Yolanda Yolanda pigtail catheter was exchanged for Yolanda JB 1 glide catheter. Internal carotid  artery was then catheterized Yolanda angiogram taken. Further three-dimensional angiogram was taken. 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 Yolanda exchange stiff Glidewire was introduced into Yolanda left internal carotid artery, Yolanda a double exchange was performed, to replace Yolanda 5 [place] sheath with an 8 French sheath, while Yolanda neuron max was advanced into Yolanda distal left common carotid artery. Under roadmap guidance, Yolanda guide catheter was advanced over Yolanda 27 microcatheter into Yolanda left internal carotid artery. Yolanda microcatheter was further advanced into Yolanda distal left M1 segment, just proximal to Yolanda bifurcation. This allowed advancement of Yolanda guide catheter into Yolanda proximal left M1. Yolanda 27 microcatheter was then removed Yolanda Yolanda 17 microcatheter was introduced over Yolanda microwire. Yolanda catheter was then navigated into Yolanda Boone lumen over Yolanda microwire. Yolanda wire was then removed. Yolanda above coils were then sequentially deployed with progressive  exclusion of Yolanda Boone. Cerebral angiography was performed immediately after Yolanda coiling catheter was removed for embolization. Yolanda guide catheter withdrawn into Yolanda cervical internal carotid artery. Final control angiography was then performed from Yolanda guide catheter. At this point, Yolanda exchange length stiff Glidewire was introduced, Yolanda Yolanda guide catheter Yolanda sheath were exchanged for Yolanda JB 1 glide catheter. Yolanda diagnostic cerebral angiogram was completed with catheterization of Yolanda right internal carotid artery Yolanda left vertebral artery. Yolanda catheter was then removed without incident. FINDINGS: Left internal carotid, head: Injection reveals Yolanda presence of a widely patent ICA, M1, Yolanda A1 segments Yolanda their branches. Incidental note is made of a fetal type left posterior cerebral artery. There is a somewhat irregular shaped Boone of Yolanda left middle cerebral artery bifurcation, better delineated on Yolanda three-dimensional rotational angiogram. Further oblique views do demonstrate Yolanda Boone measuring approximately 4 mm tall with an approximally 2 mm neck. Boone appears to be irregular shaped, with a "mitten" shape appearance. Also seen is another Boone of Yolanda left posterior communicating artery measuring approximally 2.3 mm tall by approximally 2.4 mm wide. Boone appears to arise at Yolanda origin of Yolanda left posterior communicating artery. No significant vasospasm is seen. Yolanda parenchymal Yolanda venous phases are normal. Yolanda venous sinuses are widely patent. Left internal carotid, 3D rotation 3-dimensional rotational angiographic images were reconstructed on an independent workstation for review. These further delineate Yolanda above-described irregular-shaped left middle cerebral artery bifurcation Boone. Of note, there does appear to be a slight waist at Yolanda origin of Yolanda Boone suggesting a possible favorable dome to neck ratio for Boone coiling. Also seen is Yolanda above described left posterior  communicating artery Boone projecting laterally Yolanda posteriorly. Right internal carotid, head: Injection reveals Yolanda presence of a widely patent ICA, A1, Yolanda M1 segments Yolanda their branches. Again seen is a fetal type right posterior cerebral artery. There is a smooth, regular appearing Boone projecting medially Yolanda inferiorly from Yolanda supraclinoid right internal carotid artery measuring approximally 3.6 mm tall by 4.3 mm wide. Boone appears to have a relatively wide neck. Also seen is a small Boone arising at Yolanda origin of Yolanda right posterior communicating artery measuring approximately 1.5 x 1.7 mm. Yolanda parenchymal Yolanda venous phases are normal. Yolanda venous sinuses are widely patent. Left vertebral: Injection reveals Yolanda presence of a widely patent vertebral artery. This leads to a widely patent basilar artery that terminates in bilateral superior cerebellar arteries. Yolanda bilateral posterior cerebral arteries are not visualized consistent with Yolanda presence of  fetal type posterior cerebral arteries from Yolanda anterior circulation bilaterally. While Yolanda basilar artery appears to be relatively small, this is likely a result of Yolanda fetal type posterior circulation. No vasospasm is noted. Yolanda basilar apex is normal. No aneurysms, AVMs, or high-flow fistulas are seen. Yolanda parenchymal Yolanda venous phases are normal. Yolanda venous sinuses are widely patent. Left middle cerebral artery, head (pre embolization) : Injection reveals widely patent left middle cerebral artery Yolanda bifurcation. Yolanda above described Boone is again visualized, with good visualization of Yolanda neck of Yolanda Boone. Left middle cerebral artery, head (during embolization): Injection reveals Yolanda presence of a widely patent M1, as well as patent MCA bifurcation Yolanda distal branches. Coil mass within Yolanda Boone is stable, without coil prolapse into Yolanda MCA, Yolanda no filling defect to suggest thrombus. Left middle cerebral artery, head (immediate  post-embolization): Injection reveals Yolanda presence of a widely patent M1 Yolanda more distal MCA branches. Coil mass within Yolanda Boone is stable, without coil prolapse into Yolanda bifurcation. No filling defects are seen. There is minimal neck residual measuring at most 1 x 1 mm. Yolanda dome of Yolanda Boone appears to be occluded, with coil mass also within Yolanda small daughter sac. Left internal carotid artery, head (final control): Injection reveals Yolanda presence of a widely patent ICA that leads to a patent ACA Yolanda MCA. No thrombus is visualized. Coil mass is seen within Yolanda Boone Yolanda is in stable position. Capillary phase does not demonstrate any branch occlusions or perfusion deficits. Venous sinuses remain widely patent. Right femoral: Normal vessel. No significant atherosclerotic disease. Arterial sheath in adequate position. DISPOSITION: Upon completion of Yolanda study, Yolanda femoral sheath was removed Yolanda hemostasis obtained using a 8-Fr Angio-Seal closure device. Good proximal Yolanda distal lower extremity pulses were documented upon achievement of hemostasis. Yolanda procedure was well tolerated Yolanda no early complications were observed. Yolanda patient was transferred to Yolanda neuro intensive care unit for further care. IMPRESSION: 1. Successful coil embolization of an irregular left middle cerebral artery Boone as Yolanda source of subarachnoid hemorrhage. Minimal neck residual is noted post coiling. 2. Incidental note of somewhat irregular left posterior communicating artery Boone measuring 2.4 mm in maximal dimension, as described above. 3. Incidental, smooth appearing right supraclinoid internal carotid artery Boone measuring 4.3 mm in maximal dimension, as described above. 4. Incidental, small right posterior communicating artery Boone measuring 1.7 mm in maximal dimension, as described above. 5.  No significant intracranial vasospasm is noted. Yolanda preliminary results of this procedure were shared with Yolanda patient's  family. Electronically Signed   By: Consuella Lose   On: 06/21/2021 11:12    PHYSICAL EXAM General:  Intubated, well-developed, well-nourished patient in no acute distress Respiratory:  Respirations synchronous with ventilator Neurological (on sedation with propofol Yolanda likely residual paralytic from procedure):  Pupils sluggish, corneal reflexes Yolanda oculocephalic reflex absent.  Does not respond to voice but will flicker RUE Yolanda BLE to noxious stimuli  ASSESSMENT/PLAN Yolanda Boone is a 61 y.o. female with history of thoracic aortic Boone, Yolanda Boone, Yolanda Boone Yolanda Boone s/p radiation presenting after being found unresponsive by her family with left gaze deviation, right arm flexion Yolanda left arm extension.  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  She has had coiling of Yolanda Boone this morning Yolanda remains intubated after Yolanda procedure.  SAH: due to left MCA Boone s/p coiling Code Stroke CT head SAH centered in left  sylvian fissure with 65mm midline shift CTA head & neck left MCA Boone, probably ruptured, right supraclinoid ICA Boone, no LVO, chronic aortic arch dissection TCD doppler pending 2D Echo EF 55 to 60% LDL 105 HgbA1c 5.5 UDS negative VTE prophylaxis - SCDs No antithrombotic prior to admission, now on No antithrombotic secondary to Yolanda [facility] On nimodipine Therapy recommendations:  pending Disposition:  pending  Respiratory failure Aspiration Patient was intubated for airway protection Ventilator management per CCM On Unasyn  Seizure vs. seizure-like activity Presented with left gaze, right arm flexion Yolanda left arm extension On Keppra EEG no seizure, cortical dysfunction arising from left hemisphere likely secondary to underlying structural abnormality. Additionally there is moderate to severe diffuse encephalopathy  Hypertension Home meds:  amlodipine 5 mg daily, carvedilol 6.25 mg BID Stable Keep  SBP < 160 Long-term BP goal normotensive  Hyperlipidemia Home meds:  none LDL 105, goal < 70 Add statin at discharge  Chronic aortic dissection Planned for surgery by vascular at [place] on board  Other Stroke Risk Factors Obesity, Body mass index is 39.5 kg/m., BMI >/= 30 associated with increased stroke risk, recommend weight loss, diet Yolanda exercise as appropriate   Other Active Problems none  Hospital day # [place] , MSN, AGACNP-BC [facility] See Amion for schedule Yolanda pager information 06/21/2021 1:00 PM  ATTENDING NOTE: I reviewed above note Yolanda agree with Yolanda assessment Yolanda plan. Pt was seen Yolanda examined.   61 year old female with no known past medical history presented after found down with unresponsiveness.  Also found to have left gaze, right arm flexion, left arm extension.  CT showed diffuse SAH centered at left sylvian fissure.  CT head Yolanda neck left MCA ruptured Boone, right ICA siphon unruptured Boone.  Chronic aortic dissection.  EF 55 to 60%, UDS negative.  LDL 105, A1c 5.5.  Creatinine 0.72.  Patient underwent Boone coil embolization with Dr. Kathyrn Sheriff.  On exam, no family at Yolanda bedside. Pt is intubated on propofol, eyes closed, not following commands. With forced eye opening, eyes in mid position, spontaneous rolling movement to Yolanda left with nystagmus, not rolling to Yolanda right. Not blinking to visual threat, doll's eyes present to Yolanda left, not tracking, pupils b/l 1.55mm, sluggish to light. Corneal reflex present, stronger on Yolanda left, gag Yolanda cough present. Breathing  over Yolanda vent.  Facial symmetry not able to test due to ET tube.  Tongue protrusion not cooperative. On pain stimulation, mild withdraw at LUE, no movement in other extremities. Sensation, coordination Yolanda gait not tested.  Etiology evaluation SAH likely due to Boone rupture.  Patient does have unruptured Boone which may need further treatment also.   Chronic aortic dissection, was planned for vascular repair at [facility], may need close follow-up.  EEG no seizure, continue Keppra.  On nimodipine continue for 21 days.  TCD monitoring to rule out vasospasm.  Vent management per CCM.  [facility] management per neurosurgery. Neurology will sign off. Please call with questions. Thanks for Yolanda consult.   For detailed assessment Yolanda plan, please refer to above as I have made changes wherever appropriate.   Rosalin Hawking, MD PhD Stroke Neurology 06/21/2021 2:47 PM  This patient is critically ill due to [facility], ruptured Boone, seizure-like activity, respiratory failure Yolanda at significant risk of neurological worsening, death form worsening ICH, vasospasm, delayed cerebral ischemia, status epilepticus. This patient's care requires constant monitoring of vital signs, hemodynamics, respiratory Yolanda cardiac monitoring, review of multiple databases,  neurological assessment, discussion with family, other specialists Yolanda medical decision making of high complexity. I spent 40 minutes of neurocritical care time in Yolanda care of this patient. I discussed with CCM Dr. Tacy Learn.   To contact Stroke Continuity provider, please refer to [email or web address] After hours, contact General Neurology

## 2021-06-21 NOTE — Progress Notes (Signed)
Transcranial Doppler  Date POD PCO2 HCT BP  MCA ACA PCA OPHT SIPH VERT Basilar  5/29 RH     Right  Left   *  *   *  *   *  *   9  11   48  40   *  *   *           Right  Left                                            Right  Left                                             Right  Left                                             Right  Left                                            Right  Left                                            Right  Left                                        MCA = Middle Cerebral Artery      OPHT = Opthalmic Artery     BASILAR = Basilar Artery   ACA = Anterior Cerebral Artery     SIPH = Carotid Siphon PCA = Posterior Cerebral Artery   VERT = Verterbral Artery                   Normal MCA = 62+\-12 ACA = 50+\-12 PCA = 42+\-23   *= Unable to insonate  Jean Rosenthal, RDMS, RVT

## 2021-06-21 NOTE — Brief Op Note (Signed)
  NEUROSURGERY BRIEF OPERATIVE  NOTE   PREOP DX: Subarachnoid Hemorrhage  POSTOP DX: Same  PROCEDURE: Diagnostic cerebral angiogram  SURGEON: Dr. Lisbeth Renshaw, MD  ANESTHESIA: GETA  EBL: Minimal  SPECIMENS: None  COMPLICATIONS: None  CONDITION: Stable to ICU  FINDINGS (Full report in [facility]): 1. Successful coil embolization of ruptured LMCA aneurysm with minimal neck residual 2. 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 No vasospasm   Lisbeth Renshaw, MD [facility]

## 2021-06-21 NOTE — Progress Notes (Signed)
Brother provided information on [facility] cardiologists who are following patient regarding aortic dissection. Dr Bennie Pierini Dr Silas [place]  Yolanda Boone C  5:54 PM

## 2021-06-21 NOTE — Progress Notes (Signed)
OT Cancellation Note  Patient Details Name: Yolanda Boone MRN: [id number] DOB: [birth year]   Cancelled Treatment:    Reason Eval/Treat Not Completed: Active bedrest order (Will assess wehn activity orders updated)  Stephfon Bovey,HILLARY 06/21/2021, 8:41 AM Luisa Dago, OT/L   Acute OT Clinical Specialist Acute Rehabilitation Services Pager [phone] Office [phone]

## 2021-06-21 NOTE — Anesthesia Procedure Notes (Signed)
Arterial Line Insertion Start/End5/29/2023 9:58 AM Performed by: Drema Pry, CRNA, CRNA  Patient location: OR. Preanesthetic checklist: patient identified, IV checked and monitors and equipment checked Left, radial was placed Catheter size: 20 G Hand hygiene performed   Attempts: 2 Procedure performed using ultrasound guided technique. Following insertion, dressing applied and Biopatch. Post procedure assessment: normal  Patient tolerated the procedure well with no immediate complications.

## 2021-06-21 NOTE — Progress Notes (Signed)
  NEUROSURGERY PROGRESS NOTE   No issues overnight. History reviewed with patient's daughter. Briefly, had sudden onset of decreased level of consciousness. No preceding HA. Has hx of gastric bypass with apparent improvement in essential HTN. No known FHx of intracranial aneurysms. Non-smoker. Of note, she has a history of traumatic aortic dissection apprently planned for treatment at [facility].  EXAM:  BP 118/88   Pulse 88   Temp 98.9 F (37.2 C) (Axillary)   Resp (!) 23   Ht 5\' 7"  (1.702 m)   Wt 114.4 kg   SpO2 100%   BMI 39.50 kg/m   Awake, alert, intubated Breathing over vent CN grossly intact  MAE symmetrically   IMAGING: CT head reviewed with diffuse basal subarachnoid hemorrhage with thick left-sided sylvian clot.  CT angiogram was also personally reviewed which demonstrates a left middle cerebral artery bifurcation aneurysm in close proximity to the left sylvian clot as the source of subarachnoid hemorrhage.  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  Of note, there does appear to be a chronic dissection flap involving the proximal descending aorta, in association with aberrant origin of the right subclavian artery.  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  PLAN: -We will plan on proceeding with diagnostic cerebral angiogram, possible aneurysm coiling of ruptured left middle cerebral artery aneurysm.  Would not plan on treating unruptured aneurysms at the same time. -After brief discussion with vascular surgery, I do feel that attempt to cross the dissection flap is reasonable using a pigtail catheter. -Continue supportive care  I have reviewed the situation with the patient's daughter at bedside.  I have reviewed with her the details of the angiogram and coiling  procedure, as well as the possible need for surgical clip ligation.  We have discussed the associated risks, primarily being risk of stroke or rehemorrhage.  We also discussed risks of arterial dissection, contrast nephropathy, and groin hematoma as well as risk specific to surgical clipping including bleeding, infection, seizures, hydrocephalus.  I did tell her that the goal of treatment today is lowering the risk of rehemorrhage, while neurologic recovery will take time. All her questions today were answered and she provided consent to proceed on the patient's behalf.   41, MD [facility]

## 2021-06-22 ENCOUNTER — Inpatient Hospital Stay (HOSPITAL_COMMUNITY): Payer: BC Managed Care – PPO

## 2021-06-22 ENCOUNTER — Encounter (HOSPITAL_COMMUNITY): Payer: Self-pay

## 2021-06-22 DIAGNOSIS — J9600 Acute respiratory failure, unspecified whether with hypoxia or hypercapnia: Secondary | ICD-10-CM | POA: Diagnosis not present

## 2021-06-22 DIAGNOSIS — E876 Hypokalemia: Secondary | ICD-10-CM | POA: Diagnosis present

## 2021-06-22 DIAGNOSIS — I6012 Nontraumatic subarachnoid hemorrhage from left middle cerebral artery: Secondary | ICD-10-CM | POA: Diagnosis present

## 2021-06-22 DIAGNOSIS — I608 Other nontraumatic subarachnoid hemorrhage: Secondary | ICD-10-CM | POA: Diagnosis not present

## 2021-06-22 DIAGNOSIS — Z8679 Personal history of other diseases of the circulatory system: Secondary | ICD-10-CM

## 2021-06-22 DIAGNOSIS — I609 Nontraumatic subarachnoid hemorrhage, unspecified: Secondary | ICD-10-CM

## 2021-06-22 DIAGNOSIS — J690 Pneumonitis due to inhalation of food and vomit: Secondary | ICD-10-CM | POA: Diagnosis present

## 2021-06-22 DIAGNOSIS — G935 Compression of brain: Secondary | ICD-10-CM

## 2021-06-22 DIAGNOSIS — I71019 Dissection of thoracic aorta, unspecified: Secondary | ICD-10-CM | POA: Diagnosis not present

## 2021-06-22 LAB — CBC WITH DIFFERENTIAL/PLATELET
Abs Immature Granulocytes: 0.05 10*3/uL (ref 0.00–0.07)
Basophils Absolute: 0 10*3/uL (ref 0.0–0.1)
Basophils Relative: 0 %
Eosinophils Absolute: 0 10*3/uL (ref 0.0–0.5)
Eosinophils Relative: 0 %
HCT: 35.8 % — ABNORMAL LOW (ref 36.0–46.0)
Hemoglobin: 10.9 g/dL — ABNORMAL LOW (ref 12.0–15.0)
Immature Granulocytes: 1 %
Lymphocytes Relative: 13 %
Lymphs Abs: 1.3 10*3/uL (ref 0.7–4.0)
MCH: 28.3 pg (ref 26.0–34.0)
MCHC: 30.4 g/dL (ref 30.0–36.0)
MCV: 93 fL (ref 80.0–100.0)
Monocytes Absolute: 1 10*3/uL (ref 0.1–1.0)
Monocytes Relative: 10 %
Neutro Abs: 7.3 10*3/uL (ref 1.7–7.7)
Neutrophils Relative %: 76 %
Platelets: 176 10*3/uL (ref 150–400)
RBC: 3.85 MIL/uL — ABNORMAL LOW (ref 3.87–5.11)
RDW: 13.5 % (ref 11.5–15.5)
WBC: 9.7 10*3/uL (ref 4.0–10.5)
nRBC: 0 % (ref 0.0–0.2)

## 2021-06-22 LAB — BASIC METABOLIC PANEL
Anion gap: 7 (ref 5–15)
BUN: 7 mg/dL (ref 6–20)
CO2: 24 mmol/L (ref 22–32)
Calcium: 8.5 mg/dL — ABNORMAL LOW (ref 8.9–10.3)
Chloride: 112 mmol/L — ABNORMAL HIGH (ref 98–111)
Creatinine, Ser: 0.61 mg/dL (ref 0.44–1.00)
GFR, Estimated: >60 mL/min (ref >60)
Glucose, Bld: 140 mg/dL — ABNORMAL HIGH (ref 70–99)
Potassium: 2.8 mmol/L — ABNORMAL LOW (ref 3.5–5.1)
Sodium: 143 mmol/L (ref 135–145)

## 2021-06-22 LAB — GLUCOSE, CAPILLARY
Glucose-Capillary: 125 mg/dL — ABNORMAL HIGH (ref 70–99)
Glucose-Capillary: 154 mg/dL — ABNORMAL HIGH (ref 70–99)
Glucose-Capillary: 130 mg/dL — ABNORMAL HIGH (ref 70–99)

## 2021-06-22 LAB — PHOSPHORUS: Phosphorus: 3 mg/dL (ref 2.5–4.6)

## 2021-06-22 LAB — MAGNESIUM: Magnesium: 1.7 mg/dL (ref 1.7–2.4)

## 2021-06-22 MED ORDER — POTASSIUM CHLORIDE 20 MEQ PO PACK
40.0000 meq | PACK | Freq: Two times a day (BID) | ORAL | Status: DC
  Administered 2021-06-22: 40 meq via ORAL
  Filled 2021-06-22: qty 2

## 2021-06-22 MED ORDER — VITAL HIGH PROTEIN PO LIQD
1000.0000 mL | ORAL | Status: AC
  Administered 2021-06-22: 1000 mL

## 2021-06-22 MED ORDER — VITAL HIGH PROTEIN PO LIQD
1000.0000 mL | ORAL | Status: DC
Start: 2021-06-22 — End: 2021-06-22
  Administered 2021-06-22: 1000 mL

## 2021-06-22 MED ORDER — DEXMEDETOMIDINE HCL IN NACL 400 MCG/100ML IV SOLN
0.4000 ug/kg/h | INTRAVENOUS | Status: DC
  Administered 2021-06-22 – 2021-06-23 (×2): 0.2 ug/kg/h via INTRAVENOUS
  Filled 2021-06-22 (×2): qty 100

## 2021-06-22 MED ORDER — PROSOURCE TF PO LIQD
45.0000 mL | Freq: Two times a day (BID) | ORAL | Status: DC
  Administered 2021-06-22: 45 mL
  Filled 2021-06-22: qty 45

## 2021-06-22 MED ORDER — SODIUM CHLORIDE 0.9 % IV SOLN
INTRAVENOUS | Status: DC
Start: 2021-06-22 — End: 2021-06-25

## 2021-06-22 MED ORDER — VITAL 1.5 CAL PO LIQD
1000.0000 mL | ORAL | Status: DC
  Administered 2021-06-22 – 2021-06-24 (×3): 1000 mL

## 2021-06-22 MED ORDER — ADULT MULTIVITAMIN W/MINERALS CH
1.0000 | ORAL_TABLET | Freq: Two times a day (BID) | ORAL | Status: DC
  Administered 2021-06-22 – 2021-07-09 (×34): 1
  Filled 2021-06-22 (×34): qty 1

## 2021-06-22 MED ORDER — POTASSIUM CHLORIDE 20 MEQ PO PACK
40.0000 meq | PACK | Freq: Two times a day (BID) | ORAL | Status: AC
  Administered 2021-06-22 – 2021-06-24 (×4): 40 meq
  Filled 2021-06-22 (×4): qty 2

## 2021-06-22 MED ORDER — MAGNESIUM SULFATE 2 GM/50ML IV SOLN
2.0000 g | Freq: Once | INTRAVENOUS | Status: AC
  Administered 2021-06-22: 2 g via INTRAVENOUS
  Filled 2021-06-22: qty 50

## 2021-06-22 MED ORDER — PROSOURCE TF PO LIQD
90.0000 mL | Freq: Three times a day (TID) | ORAL | Status: DC
  Administered 2021-06-22 – 2021-06-25 (×8): 90 mL
  Filled 2021-06-22 (×8): qty 90

## 2021-06-22 MED ORDER — VITAL HIGH PROTEIN PO LIQD: 1000.0000 mL | ORAL | Status: DC

## 2021-06-22 NOTE — Progress Notes (Signed)
Initial Nutrition Assessment  DOCUMENTATION CODES:   Obesity unspecified  INTERVENTION:   D/C Vital High Protein  Vital 1.5 @ 40 ml/hr (960 ml/day) 90 ml ProSource TF TID  Provides: 1680 kcal, 130 grams protein, and 729 ml free water.   Check  vitamin D and vitamin B12   MVI with minerals BID to meet bariatric guidelines Calcium citrate with vitamin D 500 mg TID to meet bariatric guidelines   NUTRITION DIAGNOSIS:   Inadequate oral intake related to inability to eat as evidenced by NPO status.  GOAL:   Patient will meet greater than or equal to 90% of their needs  MONITOR:   Vent status, TF tolerance  REASON FOR ASSESSMENT:   Consult Enteral/tube feeding initiation and management  ASSESSMENT:   Pt with PMH of HTN, thoracic aneurysm, gastric bypass, and grave's dz who was admitted with [facility] s/p coiling of ruptured R MCA aneurysm.    Pt discussed during ICU rounds and with RN. Pt remains intubated, starting TF today.  Noted pt with gastric bypass in 2007, confirmed with RN who states that daughter reported gastric bypass.  During cortrak placement noted pt with no teeth.   5/30 s/p cortrak placement, per xray tip in gastric body, question if this is the gastric pouch nothing noted about gastric bypass in xray.   Medications reviewed and include: colace, protoinx, miralax, KCl,  NS @ 100 ml/hr  Precedex  Labs reviewed: K 2.8 CBG: 162   NUTRITION - FOCUSED PHYSICAL EXAM:  Flowsheet Row Most Recent Value  Orbital Region No depletion  Upper Arm Region No depletion  Thoracic and Lumbar Region No depletion  Buccal Region Unable to assess  Temple Region No depletion  Clavicle Bone Region Mild depletion  Clavicle and Acromion Bone Region Moderate depletion  Scapular Bone Region Unable to assess  Dorsal Hand Unable to assess  Patellar Region No depletion  Anterior Thigh Region No depletion  Posterior Calf Region No depletion  Edema (RD Assessment) None  Hair  Reviewed  [in braids]  Eyes Reviewed  Mouth Unable to assess  Skin Reviewed  Nails Reviewed  [painted]       Diet Order:   Diet Order     None       EDUCATION NEEDS:   No education needs have been identified at this time  Skin:  Skin Assessment: Reviewed RN Assessment  Last BM:  5/30 large; type 7  Height:   Ht Readings from Last 1 Encounters:  06/20/21 5\' 7"  (1.702 m)    Weight:   Wt Readings from Last 1 Encounters:  06/20/21 114.4 kg    BMI:  Body mass index is 39.5 kg/m.  Estimated Nutritional Needs:   Kcal:  1600  Protein:  >122 grams  Fluid:  > 1.5 L/day  06/22/21., RD, LDN, CNSC See AMiON for contact information

## 2021-06-22 NOTE — Progress Notes (Signed)
RT transported patient to CT and back with RN. No complications. RT will continue to monitor.  ?

## 2021-06-22 NOTE — Progress Notes (Signed)
Transcranial Doppler   Date POD PCO2 HCT BP   MCA ACA PCA OPHT SIPH VERT Basilar  5/29 RH         Right  Left   *  *   *  *   *  *   9  11   48  40   *  *   *       5/30 [facility]          Right  Left    *   *    *   *    *   *    14   14    *   10    -32   -49    -46                 Right  Left                                                                 Right  Left                                                                 Right  Left                                                               Right  Left                                                               Right  Left                                                       MCA = Middle Cerebral Artery      OPHT = Opthalmic Artery     BASILAR = Basilar Artery   ACA = Anterior Cerebral Artery     SIPH = Carotid Siphon PCA = Posterior Cerebral Artery   VERT = Verterbral Artery                    Normal MCA = 62+\-12 ACA = 50+\-12 PCA = 42+\-23    *= Unable to insonate   [facility] RDMS, RVT

## 2021-06-22 NOTE — Assessment & Plan Note (Signed)
1mm of midline shift Now moving right leg, so shift unlikely to have progressed.   - Follow-up CT ? ( will discuss with Neurosurgery) to rule out hydrocephalus, worsening midline shift.

## 2021-06-22 NOTE — Progress Notes (Signed)
  NEUROSURGERY PROGRESS NOTE   Pt seen and examined. No issues overnight.   EXAM: Temp:  [97.2 F (36.2 C)-101.1 F (38.4 C)] 100.4 F (38 C) (05/30 0830) Pulse Rate:  [61-97] 61 (05/30 0830) Resp:  [16-26] 23 (05/30 0830) BP: (120-149)/(79-101) 134/82 (05/30 0830) SpO2:  [93 %-100 %] 99 % (05/30 0830) Arterial Line BP: (96-168)/(71-142) 102/94 (05/30 0830) FiO2 (%):  [30 %-40 %] 30 % (05/30 0815) Intake/Output      05/29 0701 05/30 0700 05/30 0701 05/31 0700   I.V. (mL/kg) 1208.9 (10.6) 19.2 (0.2)   IV Piggyback 808 100   Total Intake(mL/kg) 2016.9 (17.6) 119.2 (1)   Urine (mL/kg/hr) 3995 (1.5)    Emesis/NG output     Stool     Total Output 3995    Net -1978.1 +119.2         Drowsy but arouses to voice Breathing spontaneously on CPAP trial MAE spontaneously Right groin site soft  LABS: Lab Results  Component Value Date   CREATININE 0.61 06/22/2021   BUN 7 06/22/2021   NA 143 06/22/2021   K 2.8 (L) 06/22/2021   CL 112 (H) 06/22/2021   CO2 24 06/22/2021   Lab Results  Component Value Date   WBC 9.7 06/22/2021   HGB 10.9 (L) 06/22/2021   HCT 35.8 (L) 06/22/2021   MCV 93.0 06/22/2021   PLT 176 06/22/2021    IMPRESSION: - 61 y.o. female SAH d# 2 s/p coiling ruptured RMCA aneurysm, appears to be at preop baseline. 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  PLAN: - Cont supportive care - Wean to extubate per PCCM. If mental status cont to preclude safe extubation, can likely repeat CTH to r/o HCP - Cont Nimotop - Cont SBP goal < for today    Lisbeth Renshaw, MD [facility]

## 2021-06-22 NOTE — Procedures (Signed)
Cortrak  Person Inserting Tube:  Greig Castilla D, RD Tube Type:  Cortrak - 43 inches Tube Size:  10 Tube Location:  Left nare Secured by: Bridle Technique Used to Measure Tube Placement:  Marking at nare/corner of mouth Cortrak Secured At:  53 cm  Cortrak Tube Team Note:  Consult received to place a Cortrak feeding tube.   X-ray is required, abdominal x-ray has been ordered by the Cortrak team. Please confirm tube placement before using the Cortrak tube.   If the tube becomes dislodged please keep the tube and contact the Cortrak team at [email or web address] ([email or web address]) for replacement.  If after hours and replacement cannot be delayed, place a NG tube and confirm placement with an abdominal x-ray.    Greig Castilla, RD, LDN Clinical Dietitian RD pager # available in AMION  After hours/weekend pager # available in [facility]

## 2021-06-22 NOTE — Assessment & Plan Note (Signed)
Hunt&Hess 4 at presentation.

## 2021-06-22 NOTE — Progress Notes (Signed)
PT Cancellation Note  Patient Details Name: Danyele Smejkal MRN: [id number] DOB: [birth year]   Cancelled Treatment:    Reason Eval/Treat Not Completed: Patient not medically ready (intubated and sedated). Will follow when appropriate.  Lyanne Co, PT  Acute Rehab Services  Pager [phone] Office [phone]    Lawana Chambers Shahed Yeoman 06/22/2021, 8:38 AM

## 2021-06-22 NOTE — Assessment & Plan Note (Deleted)
K=3.4 today  - enteral repletion.

## 2021-06-22 NOTE — Consult Note (Signed)
NAME:  Yolanda Boone, MRN:  [id number], DOB:  1960/05/05, LOS: 2 ADMISSION DATE:  06/20/2021, CONSULTATION DATE:  5/28 REFERRING MD:  Valda Lamb FOR CONSULT:  Vent and medical management   History of Present Illness:  Patient is encephalopathic and/or intubated. Therefore history has been obtained from chart review.   Yolanda Boone, is a 61 y.o. female, who presented to the [facility] ED with a chief complaint of being found unresponsive.  Last known normal 1615. Per report was active earlier in day. Found down later in evening. EMS called. Patient hypoxic with sats of 60% during transport.  ED course was notable for left gaze preference and posturing on arrival. Neuro was consulted. They were intubated in the ED. [facility] concerning for large volume subarachnoid hemorrhage, greatest in the left sylvian fissure, mass effect with 4 mm rightward shift.  PCCM was consulted for assistance with ventilator and medical management  Pertinent  Medical History  HTN, Thoracic aneurysm, gastric bypass, history of Graves disease.  Significant Hospital Events: Including procedures, antibiotic start and stop dates in addition to other pertinent events   5/28 present to [facility], PCCM consult, ETT 5/28 Hunt Hess 4 SAH from L MCA aneurysm 5/28 Coil embolization   Interim History / Subjective:  Successful coiling  Remain intubated on mechanical ventilator following procedure  Objective   Blood pressure 127/90, pulse 79, temperature (!) 100.4 F (38 C), resp. rate 18, height 5\' 7"  (1.702 m), weight 114.4 kg, SpO2 100 %.    Vent Mode: CPAP;PSV FiO2 (%):  [30 %-40 %] 30 % Set Rate:  [18 bmp] 18 bmp Vt Set:  [490 mL] 490 mL PEEP:  [5 cmH20] 5 cmH20 Pressure Support:  [8 cmH20] 8 cmH20 Plateau Pressure:  [16 cmH20-18 cmH20] 16 cmH20   Intake/Output Summary (Last 24 hours) at 06/22/2021 0844 Last data filed at 06/22/2021 0600 Gross per 24 hour  Intake 1815.62 ml  Output 3470 ml  Net -1654.38 ml    Filed  Weights   06/20/21 1800 06/20/21 2200  Weight: 95 kg 114.4 kg    Examination:   Physical exam: General: Crtitically ill-appearing female, orally intubated HEENT: [place]/AT, eyes anicteric.  ETT and OGT in place Neuro: Obtunded. Sedation off. Moves all limbs to pain except RUE. Winces and tracks to voice.  Chest: Normal vesicular breath sounds, minimal secretions, tolerating SBT Heart: Regular rate and rhythm, no murmurs or gallops Abdomen: Soft, nontender, nondistended, bowel sounds present Ext: right groin angio site intact with no bruit. Right TKA scar.  Skin: No rash  Assessment & Plan:  Subarachnoid hemorrhage Hunt&Hess 4 at presentation.   Hypokalemia K=2.8 today  - enteral and parenteral repletion. - magnesium repletion.  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   - Mental status will dictate extubation - Sedation stopped.   Compression of brain due to nontraumatic subarachnoid hemorrhage (HCC) 2mm of midline shift Now moving right leg, so shift unlikely to have progressed.   - Follow-up CT ? ( will discuss with Neurosurgery) to rule out hydrocephalus, worsening midline shift.   Hx of aortic aneurysm No sign of rupture, no pulse deficits.  - Will need to balance need for permissive hypertension to prevent vasospasm with the risk of further dissection.  Best Practice (right click and "Reselect all SmartList Selections" daily)   Diet/type: NPO w/ meds via tube DVT prophylaxis: prophylactic heparin  GI prophylaxis: PPI Lines: N/A Foley:  removal ordered  Code Status:  full code Last date of multidisciplinary goals of care discussion  [pending]  CRITICAL CARE Performed by: Kipp Brood   Total critical care time: 45 minutes  Critical care time was exclusive of separately billable procedures and treating other patients.  Critical care was necessary to treat or prevent imminent or life-threatening deterioration.  Critical care was time spent personally by me on the following activities: development of treatment plan with patient and/or surrogate as well as nursing, discussions with consultants, evaluation of patient's response to treatment, examination of patient, obtaining history from patient or surrogate, ordering and performing treatments and interventions, ordering and review of laboratory studies, ordering and review of radiographic studies, pulse oximetry, re-evaluation of patient's condition and participation in multidisciplinary rounds.  Kipp Brood, MD [facility] ICU Physician [place]  Pager: [phone] Mobile: [phone] After hours: [phone].

## 2021-06-22 NOTE — Assessment & Plan Note (Addendum)
Now post bleed day 9 No vasospasm on angiogram Inadequate acoustic windows on TCD 5/30 Echo normal 5/30  CT 5/31 shows stable bleed but evidence of MCA infarction.   Presently she is awake and move left side as expected. Aphasia continues, but starting to move right hand  -Allow permissive hypertension with SBP 120-180 -Stopped IV fluids yesterday. - should be able to move CIR latter this week if no vasospasm.

## 2021-06-22 NOTE — Progress Notes (Signed)
OT Cancellation Note  Patient Details Name: Yolanda Boone MRN: [id number] DOB: 09/27/1960   Cancelled Treatment:    Reason Eval/Treat Not Completed: Active bedrest order (Intubated/sedated). Will assess when medically appropriate and activity orders updated. Thanks  Jyll Tomaro,HILLARY 06/22/2021, 8:10 AM Luisa Dago, OT/L   Acute OT Clinical Specialist Acute Rehabilitation Services Pager [phone] Office [phone]

## 2021-06-22 NOTE — Assessment & Plan Note (Deleted)
-   Complete 5 days of antibiotics.  

## 2021-06-22 NOTE — Progress Notes (Signed)
SLP Cancellation Note  Patient Details Name: Takirah Binford MRN: [id number] DOB: 1960-10-20   Cancelled treatment:       Reason Eval/Treat Not Completed: Patient not medically ready- (Intubated/sedated)  Will follow for readiness.  Margit Batte L. Samson Frederic, MA CCC/SLP Acute Rehabilitation Services Office number [phone] Pager [phone]    Blenda Mounts Laurice 06/22/2021, 10:19 AM

## 2021-06-22 NOTE — Progress Notes (Signed)
eLink Physician-Brief Progress Note Patient Name: Yolanda Boone DOB: 09/11/1960 MRN: [id number]   Date of Service  06/22/2021  HPI/Events of Note  Nursing request for AM lab orders.   eICU Interventions  Will order CBC with platelets, BMP and Mg++ level now.      Intervention Category Major Interventions: Other:  [name] Dennard Nip 06/22/2021, 5:14 AM

## 2021-06-22 NOTE — Assessment & Plan Note (Addendum)
Intubated for airway protection. Tolerated successful extubation last week.   - SLP has cleared for dysphagia 1 diet.  - Still not mobilizing to chair.

## 2021-06-22 NOTE — Hospital Course (Signed)
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

## 2021-06-22 NOTE — Assessment & Plan Note (Signed)
No sign of rupture, no pulse deficits.  - Will need to balance need for permissive hypertension to prevent vasospasm with the risk of further dissection.

## 2021-06-23 ENCOUNTER — Encounter (HOSPITAL_COMMUNITY): Payer: Self-pay | Admitting: Neurosurgery

## 2021-06-23 DIAGNOSIS — I71019 Dissection of thoracic aorta, unspecified: Secondary | ICD-10-CM | POA: Diagnosis not present

## 2021-06-23 DIAGNOSIS — J9600 Acute respiratory failure, unspecified whether with hypoxia or hypercapnia: Secondary | ICD-10-CM | POA: Diagnosis not present

## 2021-06-23 DIAGNOSIS — I608 Other nontraumatic subarachnoid hemorrhage: Secondary | ICD-10-CM | POA: Diagnosis not present

## 2021-06-23 LAB — VITAMIN D 25 HYDROXY (VIT D DEFICIENCY, FRACTURES): Vit D, 25-Hydroxy: 22.88 ng/mL — ABNORMAL LOW (ref 30–100)

## 2021-06-23 LAB — PHOSPHORUS
Phosphorus: 2.8 mg/dL (ref 2.5–4.6)
Phosphorus: 2.9 mg/dL (ref 2.5–4.6)

## 2021-06-23 LAB — BASIC METABOLIC PANEL
Anion gap: 6 (ref 5–15)
BUN: 12 mg/dL (ref 6–20)
CO2: 25 mmol/L (ref 22–32)
Calcium: 8.6 mg/dL — ABNORMAL LOW (ref 8.9–10.3)
Chloride: 112 mmol/L — ABNORMAL HIGH (ref 98–111)
Creatinine, Ser: 0.66 mg/dL (ref 0.44–1.00)
GFR, Estimated: >60 mL/min (ref >60)
Glucose, Bld: 130 mg/dL — ABNORMAL HIGH (ref 70–99)
Potassium: 3.8 mmol/L (ref 3.5–5.1)
Sodium: 143 mmol/L (ref 135–145)

## 2021-06-23 LAB — GLUCOSE, CAPILLARY
Glucose-Capillary: 143 mg/dL — ABNORMAL HIGH (ref 70–99)
Glucose-Capillary: 118 mg/dL — ABNORMAL HIGH (ref 70–99)
Glucose-Capillary: 112 mg/dL — ABNORMAL HIGH (ref 70–99)
Glucose-Capillary: 120 mg/dL — ABNORMAL HIGH (ref 70–99)
Glucose-Capillary: 136 mg/dL — ABNORMAL HIGH (ref 70–99)
Glucose-Capillary: 138 mg/dL — ABNORMAL HIGH (ref 70–99)

## 2021-06-23 LAB — MAGNESIUM
Magnesium: 1.8 mg/dL (ref 1.7–2.4)
Magnesium: 2 mg/dL (ref 1.7–2.4)

## 2021-06-23 LAB — VITAMIN B12: Vitamin B-12: 376 pg/mL (ref 180–914)

## 2021-06-23 MED ORDER — FENTANYL CITRATE PF 50 MCG/ML IJ SOSY: 25.0000 ug | PREFILLED_SYRINGE | INTRAMUSCULAR | Status: DC | PRN

## 2021-06-23 MED ORDER — OYSTER SHELL CALCIUM/D3 500-5 MG-MCG PO TABS
1.0000 | ORAL_TABLET | Freq: Three times a day (TID) | ORAL | Status: DC
  Administered 2021-06-23 – 2021-07-09 (×47): 1
  Filled 2021-06-23 (×47): qty 1

## 2021-06-23 MED ORDER — MAGNESIUM SULFATE 2 GM/50ML IV SOLN
2.0000 g | Freq: Once | INTRAVENOUS | Status: AC
Start: 2021-06-23 — End: 2021-06-23
  Administered 2021-06-23: 2 g via INTRAVENOUS
  Filled 2021-06-23: qty 50

## 2021-06-23 NOTE — Evaluation (Signed)
Occupational Therapy Evaluation Patient Details Name: Yolanda Boone MRN: [id number] DOB: 03/03/1960 Today's Date: 06/23/2021   History of Present Illness 61 y.o. female [facility]21 after being found down by EMS. Pt with L gaze and posturing, hypoxic to 60s requiring emergent intubation. CTH and CTA reveal large SAH with midline shift, concerning for L MCA aneurysm rupture. 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 No PMH on file.   Clinical Impression   Per sister, Pt was independent, working intermittently,  walks without DME, does her own ADL, adult daughter lives with her. Today Pt not following any commands despite attempts with verbal, visual, and tactile cues. No movement see on Right side - facial expression changed with deep nail bed pressure. PROM WFL at this time. Pt seen spontaneously moving LUE elbow to fingers, but no shoulder movement at this time, LLE withdraws from pain. Pt tracks to the left for short periods of time (less than 5 seconds), does NOT nod or shake head, rotated head to the right (vent side) and was able to rotate head to midline with heat pad prior to attempt, supported with pillows and towel roll. Pt remains on bed rest, so defer mobility - even at the bed level at this time. OT will continue to follow acutely and at this time recommending AIR post-acute to maximize safety and independence in ADL and functional transfers, cognition, and suspected hemiplegia/aphasia     Recommendations for follow up therapy are one component of a multi-disciplinary discharge planning process, led by the attending physician.  Recommendations may be updated based on patient status, additional functional criteria and insurance authorization.   Follow Up Recommendations  Acute inpatient rehab (3hours/day)    Assistance Recommended at Discharge Frequent or constant  Supervision/Assistance  Patient can return home with the following Two people to help with walking and/or transfers;Two people to help with bathing/dressing/bathroom;Assistance with cooking/housework;Assistance with feeding;Direct supervision/assist for medications management;Direct supervision/assist for financial management;Assist for transportation;Help with stairs or ramp for entrance    Functional Status Assessment  Patient has had a recent decline in their functional status and demonstrates the ability to make significant improvements in function in a reasonable and predictable amount of time.  Equipment Recommendations  Other (comment) (continue to assess)    Recommendations for Other Services Rehab consult     Precautions / Restrictions Precautions Precautions: Fall Precaution Comments: intubated Restrictions Weight Bearing Restrictions: No      Mobility Bed Mobility Overal bed mobility:  (pt on bedrest, functional mobility deferred)                  Transfers                          Balance                                           ADL either performed or assessed with clinical judgement   ADL Overall ADL's : Needs assistance/impaired                                       General ADL Comments: Pt is currently total A for all aspects of ADL  Vision   Vision Assessment?: Vision impaired- to be further tested in functional context Additional Comments: Pt tracking to the left, but does not sustain - continue to assess     Perception     Praxis      Pertinent Vitals/Pain Pain Assessment Pain Assessment: CPOT Pain Score: 0-No pain Facial Expression: Relaxed, neutral Body Movements: Absence of movements Muscle Tension: Relaxed Compliance with ventilator (intubated pts.): Coughing but tolerating Vocalization (extubated pts.): N/A CPOT Total: 1 Pain Intervention(s): Monitored during session, Limited  activity within patient's tolerance     Hand Dominance     Extremity/Trunk Assessment Upper Extremity Assessment Upper Extremity Assessment: RUE deficits/detail;LUE deficits/detail;Difficult to assess due to impaired cognition RUE Deficits / Details: flaccid, does not withdraw to pain, PROM WFL, stiff at joints, but able to perform motion as allowed by ventiliator RUE Sensation: decreased light touch (continue to assess, facial expression with nail bed pressure) RUE Coordination: decreased gross motor;decreased fine motor LUE Deficits / Details: spontaneous movement of the wrist, digits - no to command, no elbow, shoulder movement noted. PROM WFL LUE Sensation: decreased light touch;decreased proprioception (continue to assess) LUE Coordination: decreased fine motor;decreased gross motor   Lower Extremity Assessment Lower Extremity Assessment: Defer to PT evaluation RLE Deficits / Details: PROM WFL, no AROM noted, withdraws to pain LLE Deficits / Details: PROM WFL, no AROM noted, withdraws to pain. Appears to attend to light touch in LLE but does not move unless prompted by painful stimulus   Cervical / Trunk Assessment Cervical / Trunk Assessment: Other exceptions Cervical / Trunk Exceptions: neck in R lateral rotation and flexion (ventilator side)   Communication Communication Communication: Other (comment);Receptive difficulties (intubated)   Cognition Arousal/Alertness: Awake/alert Behavior During Therapy: Flat affect Overall Cognitive Status: Difficult to assess                                 General Comments: pt intubated, does not appears to follow verbal, visual or tactile cues. Pt does track with eyes. AROM noted from L elbow/wrist/hand. Pt responds to pain in all extremities (even if just a grimace)     General Comments  pt on vent, 40% FiO2, 5 PEEP, weaning from vent during evaluation.    Exercises Exercises: Other exercises Other Exercises Other  Exercises: PROM to BUE at the digits, wrist, elbow, shoulder Other Exercises: PROM for BLE ankles, knees, hips   Shoulder Instructions      Home Living Family/patient expects to be discharged to:: Private residence Living Arrangements: Children Available Help at Discharge: Family;Available PRN/intermittently                             Additional Comments: pt unable to report history. Pt's sister providing a limited history, is unsure of home setup      Prior Functioning/Environment Prior Level of Function : Independent/Modified Independent;Driving (pt's sister believes the pt works intermittently, does not state occupation)             Mobility Comments: independent without device ADLs Comments: assume independent        OT Problem List: Decreased strength;Decreased range of motion;Decreased activity tolerance;Impaired vision/perception;Decreased coordination;Decreased cognition;Decreased safety awareness;Decreased knowledge of use of DME or AE;Decreased knowledge of precautions;Impaired sensation;Obesity;Impaired UE functional use      OT Treatment/Interventions: Self-care/ADL training;Therapeutic exercise;Neuromuscular education;DME and/or AE instruction;Manual therapy;Therapeutic activities;Cognitive remediation/compensation;Visual/perceptual remediation/compensation;Patient/family education;Balance training  OT Goals(Current goals can be found in the care plan section) Acute Rehab OT Goals Patient Stated Goal: "get her back" OT Goal Formulation: With family Time For Goal Achievement: 07/07/21 Potential to Achieve Goals: Good ADL Goals Pt Will Perform Grooming: with max assist;sitting Additional ADL Goal #1: Pt will purposefully grasp an ADL object with LUE 50% of the time Additional ADL Goal #2: Pt will follow one step commands 50% of the time Additional ADL Goal #3: Pt will perform bed mobility at mod A +2 to come EOB in preparation for participation in  ADL  OT Frequency: Min 2X/week    Co-evaluation PT/OT/SLP Co-Evaluation/Treatment: Yes Reason for Co-Treatment: Complexity of the patient's impairments (multi-system involvement);Necessary to address cognition/behavior during functional activity;For patient/therapist safety;To address functional/ADL transfers PT goals addressed during session: Mobility/safety with mobility;Strengthening/ROM OT goals addressed during session: ADL's and self-care;Strengthening/ROM      AM-PAC OT "6 Clicks" Daily Activity     Outcome Measure Help from another person eating meals?: Total Help from another person taking care of personal grooming?: Total Help from another person toileting, which includes using toliet, bedpan, or urinal?: Total Help from another person bathing (including washing, rinsing, drying)?: Total Help from another person to put on and taking off regular upper body clothing?: Total Help from another person to put on and taking off regular lower body clothing?: Total 6 Click Score: 6   End of Session Equipment Utilized During Treatment: Oxygen (vent) Nurse Communication: Other (comment) (no command following during session)  Activity Tolerance: Patient tolerated treatment well Patient left: in bed;with call bell/phone within reach;with bed alarm set;with family/visitor present;with SCD's reapplied;with restraints reapplied  OT Visit Diagnosis: Other symptoms and signs involving cognitive function;Other symptoms and signs involving the nervous system (R29.898)                Time: 9798-9211 OT Time Calculation (min): 24 min Charges:  OT General Charges $OT Visit: 1 Visit OT Evaluation $OT Eval High Complexity: 1 High  Nyoka Cowden OTR/L Acute Rehabilitation Services Pager: [phone] Office: [phone]   Evern Bio Jaelynne Hockley 06/23/2021, 1:04 PM

## 2021-06-23 NOTE — Evaluation (Signed)
Physical Therapy Evaluation Patient Details Name: Yolanda Boone MRN: [id number] DOB: [birth year] Today's Date: 06/23/2021  History of Present Illness  61 y.o. female presents to [facility] hospital on 06/20/2021 after being found down by EMS. Pt with L gaze and posturing, hypoxic to 60s requiring emergent intubation. CTH and CTA reveal large SAH with midline shift, concerning for L MCA aneurysm rupture. 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 No PMH on file.  Clinical Impression  Pt presents to PT with deficits in communication, cognition, strength, power. Pt is intubated and off sedation, demonstrates no ability to follow multi-modal cues/commands during session. Pt with AROM of distal LUE demonstrated but no AROM of other extremities noted. Pt does track laterally with eyes, cervical ROM is limited at this time with head laterally flexed and rotated to R. Pt will benefit from further assessment of mobility and balance when extubated and off bedrest, in an effort to better determine rehab prognosis.       Recommendations for follow up therapy are one component of a multi-disciplinary discharge planning process, led by the attending physician.  Recommendations may be updated based on patient status, additional functional criteria and insurance authorization.  Follow Up Recommendations Acute inpatient rehab (3hours/day) (awaiting rehab consult until pt is extubated in an effort to better assess mobility and command following capabilities)    Assistance Recommended at Discharge Frequent or constant Supervision/Assistance  Patient can return home with the following  Two people to help with walking and/or transfers;Two people to help with bathing/dressing/bathroom;Assistance with cooking/housework;Assistance with feeding;Direct supervision/assist for medications management;Direct supervision/assist for financial  management;Assist for transportation;Help with stairs or ramp for entrance    Equipment Recommendations Wheelchair (measurements PT);Wheelchair cushion (measurements PT);Hospital bed (hoyer lift)  Recommendations for Other Services       Functional Status Assessment Patient has had a recent decline in their functional status and demonstrates the ability to make significant improvements in function in a reasonable and predictable amount of time.     Precautions / Restrictions Precautions Precautions: Fall Precaution Comments: intubated Restrictions Weight Bearing Restrictions: No      Mobility  Bed Mobility Overal bed mobility:  (pt on bedrest, functional mobility deferred)                  Transfers                        Ambulation/Gait                  Stairs            Wheelchair Mobility    Modified Rankin (Stroke Patients Only)       Balance                                             Pertinent Vitals/Pain Pain Assessment Pain Assessment: CPOT Pain Score: 0-No pain Facial Expression: Relaxed, neutral Body Movements: Absence of movements Muscle Tension: Relaxed Compliance with ventilator (intubated pts.): Coughing but tolerating Vocalization (extubated pts.): N/A CPOT Total: 1    Home Living Family/patient expects to be discharged to:: Private residence Living Arrangements: Children Available Help at Discharge: Family;Available PRN/intermittently               Additional Comments: pt unable to report  history. Pt's sister providing a limited history, is unsure of home setup    Prior Function Prior Level of Function : Independent/Modified Independent;Driving (pt's sister believes the pt works intermittently, does not state occupation)             Mobility Comments: independent without device       Hand Dominance        Extremity/Trunk Assessment   Upper Extremity Assessment Upper  Extremity Assessment: Defer to OT evaluation    Lower Extremity Assessment Lower Extremity Assessment: RLE deficits/detail;LLE deficits/detail RLE Deficits / Details: PROM WFL, no AROM noted, withdraws to pain LLE Deficits / Details: PROM WFL, no AROM noted, withdraws to pain. Appears to attend to light touch in LLE but does not move unless prompted by painful stimulus    Cervical / Trunk Assessment Cervical / Trunk Assessment: Other exceptions Cervical / Trunk Exceptions: neck in R lateral rotation and flexion (ventilator side)  Communication   Communication: Other (comment);Receptive difficulties (intubated)  Cognition Arousal/Alertness: Awake/alert Behavior During Therapy: Flat affect Overall Cognitive Status: Difficult to assess                                 General Comments: pt intubated, does not appears to follow verbal, visual or tactile cues. Pt does track with eyes. AROM noted from L elbow/wrist/hand. Pt responds to pain in all extremities        General Comments General comments (skin integrity, edema, etc.): pt on vent, 40% FiO2, 5 PEEP, weaning from vent during evaluation.    Exercises     Assessment/Plan    PT Assessment Patient needs continued PT services  PT Problem List Decreased strength;Decreased activity tolerance;Decreased balance;Decreased mobility;Decreased coordination;Decreased cognition;Decreased knowledge of use of DME;Decreased safety awareness;Decreased knowledge of precautions       PT Treatment Interventions DME instruction;Gait training;Stair training;Functional mobility training;Therapeutic activities;Therapeutic exercise;Balance training;Neuromuscular re-education;Patient/family education;Cognitive remediation    PT Goals (Current goals can be found in the Care Plan section)  Acute Rehab PT Goals Patient Stated Goal: to return to independence PT Goal Formulation: With family Time For Goal Achievement: 07/07/21 Potential to  Achieve Goals: Poor    Frequency Min 3X/week     Co-evaluation PT/OT/SLP Co-Evaluation/Treatment: Yes Reason for Co-Treatment: Complexity of the patient's impairments (multi-system involvement);Necessary to address cognition/behavior during functional activity;For patient/therapist safety PT goals addressed during session: Mobility/safety with mobility;Strengthening/ROM         AM-PAC PT "6 Clicks" Mobility  Outcome Measure Help needed turning from your back to your side while in a flat bed without using bedrails?: Total Help needed moving from lying on your back to sitting on the side of a flat bed without using bedrails?: Total Help needed moving to and from a bed to a chair (including a wheelchair)?: Total Help needed standing up from a chair using your arms (e.g., wheelchair or bedside chair)?: Total Help needed to walk in hospital room?: Total Help needed climbing 3-5 steps with a railing? : Total 6 Click Score: 6    End of Session Equipment Utilized During Treatment: Oxygen Activity Tolerance: Patient tolerated treatment well Patient left: in bed;with call bell/phone within reach;with bed alarm set;with family/visitor present Nurse Communication: Mobility status;Need for lift equipment PT Visit Diagnosis: Other abnormalities of gait and mobility (R26.89);Muscle weakness (generalized) (M62.81);Hemiplegia and hemiparesis Hemiplegia - Right/Left: Right Hemiplegia - caused by: Nontraumatic SAH    Time: 2947-6546 PT Time Calculation (min) (ACUTE  ONLY): 23 min   Charges:   PT Evaluation $PT Eval Moderate Complexity: 1 Mod          Arlyss Gandyyan J Jackie Littlejohn, PT, DPT Acute Rehabilitation Pager: [phone] Office [phone]   Arlyss GandyRyan J Ambermarie Honeyman 06/23/2021, 12:35 PM

## 2021-06-23 NOTE — Progress Notes (Signed)
  NEUROSURGERY PROGRESS NOTE   Pt seen and examined. No issues overnight, extubated today.  EXAM: Temp:  [98.5 F (36.9 C)-101 F (38.3 C)] 100.1 F (37.8 C) (05/31 1535) Pulse Rate:  [50-80] 69 (05/31 1700) Resp:  [18-34] 23 (05/31 1700) BP: (106-168)/(72-107) 131/94 (05/31 1700) SpO2:  [96 %-100 %] 99 % (05/31 1700) FiO2 (%):  [30 %-40 %] 40 % (05/31 1113) Intake/Output      05/30 0701 05/31 0700 05/31 0701 06/01 0700   I.V. (mL/kg) 2209.2 (19.3) 804.9 (7)   NG/GT 345.3 120   IV Piggyback 600 250   Total Intake(mL/kg) 3154.5 (27.6) 1174.9 (10.3)   Urine (mL/kg/hr) 1205 (0.4) 1500 (1.3)   Stool 0    Total Output 1205 1500   Net +1949.5 -325.1        Urine Occurrence 3 x    Stool Occurrence 3 x     Awake, alert Non-verbal Face symmetric Not-following commands Moves LUE/LLE spontaneously, Minimal spontaneous movement RLE, no movement RUE  LABS: Lab Results  Component Value Date   CREATININE 0.66 06/23/2021   BUN 12 06/23/2021   NA 143 06/23/2021   K 3.8 06/23/2021   CL 112 (H) 06/23/2021   CO2 25 06/23/2021   Lab Results  Component Value Date   WBC 9.7 06/22/2021   HGB 10.9 (L) 06/22/2021   HCT 35.8 (L) 06/22/2021   MCV 93.0 06/22/2021   PLT 176 06/22/2021    IMAGING: CTH yesterday reviewed and demonstrates stable SAH but evidence of left MCA territory stroke.  IMPRESSION: - 61 y.o. female SAH d# 3 s/p coiling ruptured RMCA aneurysm, neurologically stable with LMCA terrirtory stroke likely a result of initial hemorrhage and compensatory MCA spasm (which had resolved on angiogram).  PLAN: - Cont supportive care - Cont PT/OT - Cont Nimotop - Cont SBP goal < for today    Lisbeth Renshaw, MD [facility]

## 2021-06-23 NOTE — Progress Notes (Signed)
RT NOTE: patient placed on CPAP/PSV of 8/5 at 0825.  Patient currently tolerating well.  Will continue to monitor and wean as tolerated.

## 2021-06-23 NOTE — Consult Note (Signed)
NAME:  Yolanda Boone, MRN:  [id number], DOB:  08-02-1960, LOS: 3 ADMISSION DATE:  06/20/2021, CONSULTATION DATE:  5/28 REFERRING MD:  Valda Lamb FOR CONSULT:  Vent and medical management   History of Present Illness:  Patient is encephalopathic and/or intubated. Therefore history has been obtained from chart review.   Yolanda Boone, is a 61 y.o. female, who presented to the [facility] ED with a chief complaint of being found unresponsive.  Last known normal 1615. Per report was active earlier in day. Found down later in evening. EMS called. Patient hypoxic with sats of 60% during transport.  ED course was notable for left gaze preference and posturing on arrival. Neuro was consulted. They were intubated in the ED. [facility] concerning for large volume subarachnoid hemorrhage, greatest in the left sylvian fissure, mass effect with 4 mm rightward shift.  PCCM was consulted for assistance with ventilator and medical management  Pertinent  Medical History  HTN, Thoracic aneurysm, gastric bypass, history of Graves disease.  Significant Hospital Events: Including procedures, antibiotic start and stop dates in addition to other pertinent events   5/28 present to [facility], PCCM consult, ETT 5/28 [facility] 4 SAH from L MCA aneurysm 5/28 Coil embolization   Interim History / Subjective:  Not consistently awake yesterday to allow for trial of extubation.   Objective   Blood pressure (!) 145/92, pulse 71, temperature 98.5 F (36.9 C), temperature source Axillary, resp. rate (!) 24, height 5\' 7"  (1.702 m), weight 114.4 kg, SpO2 100 %.    Vent Mode: PSV;CPAP FiO2 (%):  [30 %-100 %] 40 % Set Rate:  [18 bmp] 18 bmp Vt Set:  [490 mL] 490 mL PEEP:  [5 cmH20] 5 cmH20 Pressure Support:  [8 cmH20] 8 cmH20 Plateau Pressure:  [14 cmH20-17 cmH20] 17 cmH20   Intake/Output Summary (Last 24 hours) at 06/23/2021 0837 Last data filed at 06/23/2021 0700 Gross per 24 hour  Intake 3035.34 ml  Output 1205 ml  Net  1830.34 ml    Filed Weights   06/20/21 1800 06/20/21 2200  Weight: 95 kg 114.4 kg    Examination:   Physical exam: General: Crtitically ill-appearing female, orally intubated HEENT: [place]/AT, eyes anicteric.  ETT and OGT in place Neuro: Awake on minimal sedation. Moves all limbs to pain except RUE. Winces and tracks to voice.  Chest: Normal vesicular breath sounds, minimal secretions, tolerating SBT Heart: Regular rate and rhythm, no murmurs or gallops Abdomen: Soft, nontender, nondistended, bowel sounds present Ext: right groin angio site intact with no bruit. Right TKA scar.  Skin: No rash  Assessment & Plan:   Hypokalemia K=2.8 today  - enteral and parenteral repletion. - magnesium repletion.  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   Presently will awake and move left side as expected. Right UE plegia and possible receptive aphasia.   -Allow permissive hypertension with SBP 120-180 -Maintain euvolemia  Aspiration pneumonitis (HCC)  - Complete 5 days of antibiotics  Acute respiratory failure (HCC) Intubated for airway protection.   - Mental status will dictate extubation - May have sufficient airway protective reflexes for trial of extubation.   Compression of brain due to nontraumatic subarachnoid hemorrhage (HCC) 71mm of midline shift - improved on CT 5/30  - No hyperosmolar therapies needed - No EVD needed - Follow clinically  Hx of aortic aneurysm No  sign of rupture, no pulse deficits.  - Will need to balance need for permissive hypertension to prevent vasospasm with the risk of further dissection.   Best Practice (right click and "Reselect all SmartList Selections" daily)   Diet/type: tubefeeds DVT prophylaxis: prophylactic heparin  GI prophylaxis: PPI Lines: N/A Foley:   removal ordered  Code Status:  full code Last date of multidisciplinary goals of care discussion [pending]  CRITICAL CARE Performed by: Kipp Brood   Total critical care time: 27minutes  Critical care time was exclusive of separately billable procedures and treating other patients.  Critical care was necessary to treat or prevent imminent or life-threatening deterioration.  Critical care was time spent personally by me on the following activities: development of treatment plan with patient and/or surrogate as well as nursing, discussions with consultants, evaluation of patient's response to treatment, examination of patient, obtaining history from patient or surrogate, ordering and performing treatments and interventions, ordering and review of laboratory studies, ordering and review of radiographic studies, pulse oximetry, re-evaluation of patient's condition and participation in multidisciplinary rounds.  Kipp Brood, MD [facility] ICU Physician [place]  Pager: [phone] Mobile: [phone] After hours: [phone].

## 2021-06-23 NOTE — Evaluation (Signed)
Clinical/Bedside Swallow Evaluation Patient Details  Name: Yolanda Boone MRN: [id number] Date of Birth: 01-Jun-1960  Today's Date: 06/23/2021 Time: SLP Start Time (ACUTE ONLY): 1515 SLP Stop Time (ACUTE ONLY): 1535 SLP Time Calculation (min) (ACUTE ONLY): 20 min  Past Medical History: History reviewed. No pertinent past medical history.  HPI:  Patient is a 61 y.o. female with no PMH on file who was brought to [facility] on 06/20/21 after being found down by EMS. In ED, patient with left gaze and posturing, hypoxic in the 60%'s requiring emergent intubation. CT head and CTA revealed large SAH with midline shift concerning for left MCA aneurysm rupture. 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 She was extubated on 06/23/21 at 1412.    Assessment / Plan / Recommendation  Clinical Impression  Bedside/clinical swallow evaluation was very limited due to patient's inability to maintain alertness or follow commands when alert. She initially would alert to voice and touch, smiling some at SLP and RN, opening eyes and appearing to look around, moving left arm and hand but she would quickly close eyes and require cues to arouse again. She was not able to follow basic verbal commands or gestures, did not attempt to vocalize/verbalize. SLP introduced small ice chip and placed at her lips but she did not respond at all to it. She then would not awaken even with verbal and tactile cues. SLP recommending to continue NPO status and will plan to follow up next date for PO trials if patient more alert. SLP Visit Diagnosis: Dysphagia, unspecified (R13.10)    Aspiration Risk  Risk for inadequate nutrition/hydration;Severe aspiration risk    Diet Recommendation NPO   Medication Administration: Via alternative means    Other  Recommendations Oral Care Recommendations: Oral care QID;Staff/trained caregiver to provide oral care     Recommendations for follow up therapy are one component of a multi-disciplinary discharge planning process, led by the attending physician.  Recommendations may be updated based on patient status, additional functional criteria and insurance authorization.  Follow up Recommendations Acute inpatient rehab (3hours/day)      Assistance Recommended at Discharge Frequent or constant Supervision/Assistance  Functional Status Assessment Patient has had a recent decline in their functional status and demonstrates the ability to make significant improvements in function in a reasonable and predictable amount of time.  Frequency and Duration min 2x/week  1 week       Prognosis Prognosis for Safe Diet Advancement: Good Barriers to Reach Goals: Cognitive deficits;Severity of deficits      Swallow Study   General Date of Onset: 06/22/21 HPI: Patient is a 61 y.o. female with no PMH on file who was brought to [facility] on 06/20/21 after being found down by EMS. In ED, patient with left gaze and posturing, hypoxic in the 60%'s requiring emergent intubation. CT head and CTA revealed large SAH with midline shift concerning for left MCA aneurysm rupture. 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 She was extubated on 06/23/21 at 1412. Type of Study: Bedside Swallow Evaluation Previous Swallow Assessment: none found Diet Prior to this Study: NPO Temperature Spikes Noted: Yes (100.1) Respiratory Status: Nasal cannula History of Recent Intubation: Yes Length of Intubations (days): 4 days Date extubated: 06/23/21 Behavior/Cognition: Lethargic/Drowsy;Requires cueing;Doesn't follow directions Oral Cavity Assessment: Within Functional Limits Oral Care Completed by SLP: Yes Oral Cavity - Dentition:  Edentulous Self-Feeding Abilities: Total assist Patient Positioning: Upright in bed Baseline Vocal Quality: Not  observed Volitional Cough: Cognitively unable to elicit Volitional Swallow: Unable to elicit    Oral/Motor/Sensory Function Overall Oral Motor/Sensory Function: Other (comment) (patient not able to follow commands for oral-motor exam but no significant assymetry of face or lips observed)   Ice Chips Ice chips: Impaired Oral Phase Impairments: Poor awareness of bolus Other Comments: Patient did not respond orally to ice chip brought to lips   Thin Liquid Thin Liquid: Not tested    Nectar Thick Nectar Thick Liquid: Not tested   Honey Thick Honey Thick Liquid: Not tested   Puree Puree: Not tested   Solid     Solid: Not tested     Angela Nevin, MA, CCC-SLP Speech Therapy

## 2021-06-23 NOTE — Plan of Care (Signed)
  Problem: Activity: Goal: Risk for activity intolerance will decrease Outcome: Progressing   Problem: Nutrition: Goal: Adequate nutrition will be maintained Outcome: Progressing   Problem: Elimination: Goal: Will not experience complications related to bowel motility Outcome: Progressing Goal: Will not experience complications related to urinary retention Outcome: Progressing   Problem: Safety: Goal: Non-violent Restraint(s) Outcome: Completed/Met

## 2021-06-23 NOTE — Procedures (Signed)
Extubation Procedure Note  Patient Details:   Name: Yolanda Boone DOB: May 24, 1960 MRN: [id number]   Airway Documentation:    Vent end date: 06/23/21 Vent end time: 1412   Evaluation  O2 sats: stable throughout Complications: No apparent complications Patient did tolerate procedure well. Bilateral Breath Sounds: Clear, Diminished   Patient was extubated to a 4L [place] without any complications, dyspnea or stridor noted. Positive cuff leak noted prior to extubation.     Yolanda Boone, Margaretmary Dys 06/23/2021, 2:12 PM

## 2021-06-23 NOTE — Progress Notes (Signed)
Inpatient Rehab Admissions Coordinator:   Per therapy recommendations, patient was screened for CIR candidacy by Megan Salon, MS, CCC-SLP. At this time, Pt. is not yet demonstrating ability to tolerate (on bed rest) intensity of CIR.   Pt. may have potential to progress to becoming a potential CIR candidate, so CIR admissions team will follow and monitor for progress and participation with therapies and place consult order if Pt. appears to be an appropriate candidate. Please contact me with any questions.    Megan Salon, MS, CCC-SLP Rehab Admissions Coordinator  [phone] (celll) [phone] (office)

## 2021-06-24 ENCOUNTER — Encounter (HOSPITAL_COMMUNITY): Payer: Self-pay | Admitting: Neurosurgery

## 2021-06-24 DIAGNOSIS — I71019 Dissection of thoracic aorta, unspecified: Secondary | ICD-10-CM | POA: Diagnosis not present

## 2021-06-24 DIAGNOSIS — I608 Other nontraumatic subarachnoid hemorrhage: Secondary | ICD-10-CM | POA: Diagnosis not present

## 2021-06-24 LAB — CBC
HCT: 36.8 % (ref 36.0–46.0)
Hemoglobin: 12.1 g/dL (ref 12.0–15.0)
MCH: 29.3 pg (ref 26.0–34.0)
MCHC: 32.9 g/dL (ref 30.0–36.0)
MCV: 89.1 fL (ref 80.0–100.0)
Platelets: 151 10*3/uL (ref 150–400)
RBC: 4.13 MIL/uL (ref 3.87–5.11)
RDW: 13.4 % (ref 11.5–15.5)
WBC: 15.2 10*3/uL — ABNORMAL HIGH (ref 4.0–10.5)
nRBC: 0 % (ref 0.0–0.2)

## 2021-06-24 LAB — GLUCOSE, CAPILLARY
Glucose-Capillary: 137 mg/dL — ABNORMAL HIGH (ref 70–99)
Glucose-Capillary: 129 mg/dL — ABNORMAL HIGH (ref 70–99)
Glucose-Capillary: 138 mg/dL — ABNORMAL HIGH (ref 70–99)
Glucose-Capillary: 132 mg/dL — ABNORMAL HIGH (ref 70–99)
Glucose-Capillary: 133 mg/dL — ABNORMAL HIGH (ref 70–99)
Glucose-Capillary: 127 mg/dL — ABNORMAL HIGH (ref 70–99)

## 2021-06-24 LAB — BASIC METABOLIC PANEL
Anion gap: 8 (ref 5–15)
BUN: 15 mg/dL (ref 6–20)
CO2: 27 mmol/L (ref 22–32)
Calcium: 8.9 mg/dL (ref 8.9–10.3)
Chloride: 107 mmol/L (ref 98–111)
Creatinine, Ser: 0.59 mg/dL (ref 0.44–1.00)
GFR, Estimated: >60 mL/min (ref >60)
Glucose, Bld: 155 mg/dL — ABNORMAL HIGH (ref 70–99)
Potassium: 3.4 mmol/L — ABNORMAL LOW (ref 3.5–5.1)
Sodium: 142 mmol/L (ref 135–145)

## 2021-06-24 LAB — TRIGLYCERIDES: Triglycerides: 85 mg/dL (ref <150)

## 2021-06-24 NOTE — Progress Notes (Signed)
  NEUROSURGERY PROGRESS NOTE   Pt seen and examined. No issues overnight  EXAM: Temp:  [99.1 F (37.3 C)-100.1 F (37.8 C)] 99.1 F (37.3 C) (06/01 0400) Pulse Rate:  [61-96] 89 (06/01 1000) Resp:  [20-33] 33 (06/01 1000) BP: (121-168)/(67-107) 139/79 (06/01 1000) SpO2:  [93 %-100 %] 94 % (06/01 1000) FiO2 (%):  [40 %] 40 % (05/31 1113) Weight:  [112.5 kg] 112.5 kg (06/01 0600) Intake/Output      05/31 0701 06/01 0700 06/01 0701 06/02 0700   I.V. (mL/kg) 2252.9 (20) 398.2 (3.5)   NG/GT 640 154   IV Piggyback 650.2 199.9   Total Intake(mL/kg) 3543.1 (31.5) 752.1 (6.7)   Urine (mL/kg/hr) 3550 (1.3) 600 (1.6)   Stool     Total Output 3550 600   Net -6.9 +152.1         Awake, alert Non-verbal Face symmetric Not-following commands Moves LUE/LLE spontaneously, Minimal spontaneous movement RLE, no movement RUE  LABS: Lab Results  Component Value Date   CREATININE 0.59 06/24/2021   BUN 15 06/24/2021   NA 142 06/24/2021   K 3.4 (L) 06/24/2021   CL 107 06/24/2021   CO2 27 06/24/2021   Lab Results  Component Value Date   WBC 15.2 (H) 06/24/2021   HGB 12.1 06/24/2021   HCT 36.8 06/24/2021   MCV 89.1 06/24/2021   PLT 151 06/24/2021      IMPRESSION: - 61 y.o. female SAH d# 4 s/p coiling ruptured RMCA aneurysm, neurologically stable with LMCA terrirtory stroke.  PLAN: - Cont supportive care, vasospasm monitoring - Cont PT/OT - Cont Nimotop - Can liberalize SBP parameter, <168mmHg    Lisbeth Renshaw, MD [facility]

## 2021-06-24 NOTE — Consult Note (Signed)
NAME:  Yolanda Boone, MRN:  [id number], DOB:  01/18/1961, LOS: 4 ADMISSION DATE:  06/20/2021, CONSULTATION DATE:  5/28 REFERRING MD:  Donavan Burnet FOR CONSULT:  Vent and medical management   History of Present Illness:  Patient is encephalopathic and/or intubated. Therefore history has been obtained from chart review.   Yolanda Boone, is a 61 y.o. female, who presented to the [facility] ED with a chief complaint of being found unresponsive.  Last known normal 1615. Per report was active earlier in day. Found down later in evening. EMS called. Patient hypoxic with sats of 60% during transport.  ED course was notable for left gaze preference and posturing on arrival. Neuro was consulted. They were intubated in the ED. [facility] concerning for large volume subarachnoid hemorrhage, greatest in the left sylvian fissure, mass effect with 4 mm rightward shift.  PCCM was consulted for assistance with ventilator and medical management  Pertinent  Medical History  HTN, Thoracic aneurysm, gastric bypass, history of Graves disease.  Significant Hospital Events: Including procedures, antibiotic start and stop dates in addition to other pertinent events   5/28 present to [facility], PCCM consult, ETT 5/28 [facility] Hess 4 SAH from L MCA aneurysm 5/28 Coil embolization  5/31 extubated  Interim History / Subjective:  Successful extubation yesterday.    Objective   Blood pressure 134/73, pulse 84, temperature 99.1 F (37.3 C), temperature source Axillary, resp. rate (!) 23, height 5\' 7"  (1.702 m), weight 112.5 kg, SpO2 94 %.    Vent Mode: PSV;CPAP FiO2 (%):  [40 %] 40 % PEEP:  [5 cmH20] 5 cmH20 Pressure Support:  [8 cmH20] 8 cmH20   Intake/Output Summary (Last 24 hours) at 06/24/2021 0952 Last data filed at 06/24/2021 0800 Gross per 24 hour  Intake 3595.25 ml  Output 4150 ml  Net -554.75 ml    Filed Weights   06/20/21 1800 06/20/21 2200 06/24/21 0600  Weight: 95 kg 114.4 kg 112.5 kg    Examination:    Physical exam: General: Awake and lying in bed.  HEENT: SBFT in place, no pressure injury. Neuro: Awake and tracks to voice, but not able to follow commands. Moves all limbs spontaneously except for RUE.  Chest: Normal vesicular breath sounds Heart: Regular rate and rhythm, no murmurs or gallops Abdomen: Soft, nontender, nondistended, bowel sounds present Ext: right groin angio site intact with no bruit. Right TKA scar.  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   Presently she is awake and move left side as expected. Right UE plegia and possible receptive aphasia.   -Allow permissive hypertension with SBP 120-180 -Maintain euvolemia - continue IV fluids at current rate through day 10 post bleed.   Aspiration pneumonitis (HCC)  - Complete 5 days of antibiotics  Acute respiratory failure (HCC) Intubated for airway protection. Tolerated successful extubation yesterday.   - SLP has cleared for dysphagia 1 diet.   Compression of brain due to nontraumatic subarachnoid hemorrhage (HCC) 40mm of midline shift - improved on CT 5/30  - No hyperosmolar therapies needed - No EVD needed - Follow clinically  Hx of aortic aneurysm No sign of rupture, no pulse deficits.  - Will need to balance need for permissive hypertension to prevent vasospasm with the risk of further dissection.  Best Practice (right click and "Reselect all SmartList Selections" daily)   Diet/type: tubefeeds and dysphagia diet (see orders) DVT prophylaxis: prophylactic heparin  GI prophylaxis: PPI Lines: N/A Foley:  removal ordered  Code Status:  full code Last date of multidisciplinary goals of care discussion [pending]  Lynnell Catalan, MD [facility] ICU Physician [facility]  Critical Care  Pager: [phone] Mobile: [phone] After hours: [phone].

## 2021-06-24 NOTE — Progress Notes (Signed)
Speech Language Pathology Treatment: Dysphagia  Patient Details Name: Yolanda Boone MRN: [id number] DOB: 07-Jun-1960 Today's Date: 06/24/2021 Time: 8469-6295 SLP Time Calculation (min) (ACUTE ONLY): 14 min  Assessment / Plan / Recommendation Clinical Impression  Pt was seen for dysphagia treatment with her daughter present. Pt's alertness was notably improved compared to that described from her evaluation yesterday. She tolerated puree solids, ice chips, and thin liquids via cup and straw without s/sx of aspiration. No significant oral residue was noted despite use of large boluses. [name] reported that the pt does not typically eat without her dentures and that they will be brought to the hospital today or tomorrow. A dysphagia 1 diet with thin liquids is recommended at this time, but prognosis for advancement is judged to be good. SLP will continue to follow pt.     HPI HPI: Patient is a 61 y.o. female with no PMH on file who was brought to [facility] on 06/20/21 after being found down by EMS. In ED, patient with left gaze and posturing, hypoxic in the 60%'s requiring emergent intubation. CT head and CTA revealed large SAH with midline shift concerning for left MCA aneurysm rupture. 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 She was extubated on 06/23/21 at 1412.      SLP Plan  Continue with current plan of care      Recommendations for follow up therapy are one component of a multi-disciplinary discharge planning process, led by the attending physician.  Recommendations may be updated based on patient status, additional functional criteria and insurance authorization.    Recommendations  Diet recommendations: Dysphagia 1 (puree);Thin liquid Liquids provided via: Cup;Straw Medication Administration: Crushed with puree (or via cortrak) Supervision: Staff to assist with self feeding Compensations:  Slow rate;Small sips/bites Postural Changes and/or Swallow Maneuvers: Seated upright 90 degrees                Oral Care Recommendations: Oral care BID;Staff/trained caregiver to provide oral care Follow Up Recommendations: Acute inpatient rehab (3hours/day) Assistance recommended at discharge: Frequent or constant Supervision/Assistance SLP Visit Diagnosis: Dysphagia, unspecified (R13.10) Plan: Continue with current plan of care         Yolanda Boone I. Vear Clock, MS, CCC-SLP Acute Rehabilitation Services Office number [phone] Pager [phone]   Yolanda Boone  06/24/2021, 9:40 AM

## 2021-06-24 NOTE — Evaluation (Signed)
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, left ([place]) 06/22/2021   Acute respiratory failure ([place]) 06/22/2021   Aspiration pneumonitis ([place]) 06/22/2021   Compression of brain due to nontraumatic subarachnoid hemorrhage ([place]) 06/22/2021   Hypokalemia 06/22/2021   Hx of aortic aneurysm 06/22/2021   Acute respiratory failure with hypoxia (HCC)    Hx of repair of dissecting thoracic aortic aneurysm, Stanford type B 06/02/2021   Closed fracture of fifth metatarsal bone 08/06/2020   Graves disease 10/08/2019   Essential hypertension 10/03/2012   Allergic rhinitis 12/19/2011   Past Medical History: History reviewed. No pertinent past medical history. Past Surgical History:  Past Surgical History:  Procedure Laterality Date   CRANIOTOMY N/A 06/21/2021   Procedure: Cyril Loosen WITH ANEURYSM COILING;  Surgeon: Consuella Lose, MD;  Location: [facility];  Service: Neurosurgery;  Laterality: N/A;   IR 3D INDEPENDENT WKST  06/21/2021   IR ANGIO INTRA EXTRACRAN SEL INTERNAL CAROTID BILAT MOD SED  06/21/2021   IR ANGIO VERTEBRAL SEL VERTEBRAL UNI L MOD SED  06/21/2021   IR ANGIOGRAM FOLLOW UP STUDY  06/21/2021   IR ANGIOGRAM FOLLOW UP STUDY  06/21/2021   IR NEURO EACH ADD'L AFTER BASIC UNI LEFT (MS)  06/21/2021   IR TRANSCATH/EMBOLIZ  06/21/2021   HPI:  Yolanda Boone is a 61 y.o. female with no PMH on file who was brought to [facility] on 06/20/21 after being found down by EMS. In ED, Yolanda Boone with left gaze and posturing, hypoxic in the 60%'s requiring emergent intubation. CT head and CTA revealed large SAH with midline shift concerning for left MCA aneurysm rupture. On 5/29 Yolanda Boone underwent coil embolization of L MCA aneurysm with incidental  findings of bilateral PCOM and right supraclinoid ICA aneurysms. Yolanda Boone was extubated on 06/23/21 at 1412.   Assessment / Plan / Recommendation Clinical Impression  Yolanda Boone was seen for evaluation with her daughter present. Yolanda Boone's daughter reported that the Yolanda Boone was previously independent with medication and financial management. Yolanda Boone denied the Yolanda Boone having any baseline deficits in speech, language or cognition. Yolanda Boone stated that the Yolanda Boone has not communicated verbally since admission, but "grunted" last night in response to a question. Yolanda Boone demonstrated visual tracking of staff in the room, but exhibited difficulty with visual attention during confrontational naming despite cues. Yolanda Boone presented with global aphasia characterized by significant impairments in both receptive and expressive language. Yolanda Boone occasionally followed simple 1-step commands with models and/or tactile cues. Yolanda Boone did not respond to any yes/no questions even when use of gestures was encouraged. Verbal output was absent; however, Yolanda Boone occasionally demonstrated labial movements, smiling, or throat clearing in response to questions/comments. Skilled SLP services are clinically indicated at this time.    SLP Assessment  SLP Recommendation/Assessment: Yolanda Boone needs continued Speech [place] Pathology Services SLP Visit Diagnosis: Aphasia (R47.01)    Recommendations for follow up therapy are one component of a multi-disciplinary discharge planning process, led by the attending physician.  Recommendations may be updated based on Yolanda Boone status, additional functional criteria and insurance authorization.    Follow Up Recommendations  Acute inpatient rehab (3hours/day)    Assistance Recommended at Discharge  Frequent or constant Supervision/Assistance  Functional Status Assessment Yolanda Boone has had a recent decline in their functional status and demonstrates the ability to make significant improvements  in function in a reasonable and predictable amount of time.   Frequency and Duration min 2x/week  2 weeks      SLP Evaluation Cognition  Orientation Level: Other (comment) (globally aphasic, nonverbal)       Comprehension  Auditory Comprehension Overall Auditory Comprehension: Impaired Yes/No Questions: Impaired Basic Immediate Environment Questions:  (0/5) Commands: Impaired One Step Basic Commands:  (0/5; 2/5 with prompts and models)    Expression Expression Primary Mode of Expression: Verbal Verbal Expression Overall Verbal Expression: Impaired Initiation: Impaired Automatic Speech: Counting (0/7) Level of Generative/Spontaneous Verbalization:  (no verbal output) Repetition: Impaired Level of Impairment: Word level Naming: Impairment Confrontation:  (0/4)   Oral / Motor  Oral Motor/Sensory Function Overall Oral Motor/Sensory Function:  (difficult to ass) Motor Speech Overall Motor Speech:  (unable to assess)           Leahann Lempke I. Hardin Negus, [place], [place] Office number [phone] Pager [place] 06/24/2021, 11:42 AM

## 2021-06-24 NOTE — Progress Notes (Signed)
Physical Therapy Treatment Patient Details Name: Yolanda Boone MRN: [id number] DOB: September 14, 1960 Today's Date: 06/24/2021   History of Present Illness 61 y.o. female presents to [facility] hospital on 06/20/2021 after being found down by EMS. Pt with L gaze and posturing, hypoxic to 60s requiring emergent intubation. CTH and CTA reveal large SAH with midline shift, concerning for L MCA aneurysm rupture. 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 No PMH on file.    PT Comments    Pt tolerates treatment well although she does not demonstrate the ability to consistently follow commands at this time. Pt spontaneously moves LUE but PT notes no spontaneous movement of other extremities. Pt with tendency for R lateral lean, requiring totalA for all functional mobility at this time. Pt will benefit from continued acute PT services in an effort to reduce caregiver burden and falls risk.    Recommendations for follow up therapy are one component of a multi-disciplinary discharge planning process, led by the attending physician.  Recommendations may be updated based on patient status, additional functional criteria and insurance authorization.  Follow Up Recommendations  Acute inpatient rehab (3hours/day) (pt will need to demonstrate some command following capabilities)     Assistance Recommended at Discharge Frequent or constant Supervision/Assistance  Patient can return home with the following Two people to help with walking and/or transfers;Two people to help with bathing/dressing/bathroom;Assistance with cooking/housework;Assistance with feeding;Direct supervision/assist for medications management;Direct supervision/assist for financial management;Assist for transportation;Help with stairs or ramp for entrance   Equipment Recommendations  Wheelchair (measurements PT);Wheelchair cushion (measurements PT);Hospital bed (hoyer  lift)    Recommendations for Other Services       Precautions / Restrictions Precautions Precautions: Fall Restrictions Weight Bearing Restrictions: No     Mobility  Bed Mobility Overal bed mobility: Needs Assistance Bed Mobility: Supine to Sit, Sit to Supine     Supine to sit: Total assist, HOB elevated Sit to supine: Total assist, HOB elevated        Transfers Overall transfer level: Needs assistance Equipment used: 1 person hand held assist Transfers: Sit to/from Stand Sit to Stand: Total assist           General transfer comment: PT able to assist pt in clearing buttocks from bed but not able to extend knees or hips. PT notes little LE activation from patient    Ambulation/Gait                   Stairs             Wheelchair Mobility    Modified Rankin (Stroke Patients Only) Modified Rankin (Stroke Patients Only) Pre-Morbid Rankin Score: No symptoms Modified Rankin: Severe disability     Balance Overall balance assessment: Needs assistance Sitting-balance support: Single extremity supported, Feet supported Sitting balance-Leahy Scale: Zero Sitting balance - Comments: periods of max-totalA, posterior and R lateral losses of balance Postural control: Posterior lean, Right lateral lean                                  Cognition Arousal/Alertness: Awake/alert Behavior During Therapy: Flat affect Overall Cognitive Status: Impaired/Different from baseline Area of Impairment: Following commands, Safety/judgement, Awareness, Problem solving                       Following Commands:  (does not follow commands) Safety/Judgement:  Decreased awareness of safety, Decreased awareness of deficits Awareness: Intellectual Problem Solving: Slow processing, Decreased initiation, Requires verbal cues, Requires tactile cues          Exercises      General Comments General comments (skin integrity, edema, etc.): pt on 2L  [place], VSS      Pertinent Vitals/Pain Pain Assessment Pain Assessment: Faces Faces Pain Scale: No hurt    Home Living                          Prior Function            PT Goals (current goals can now be found in the care plan section) Acute Rehab PT Goals Patient Stated Goal: to return to independence Progress towards PT goals: Progressing toward goals    Frequency    Min 3X/week      PT Plan Current plan remains appropriate    Co-evaluation              AM-PAC PT "6 Clicks" Mobility   Outcome Measure  Help needed turning from your back to your side while in a flat bed without using bedrails?: Total Help needed moving from lying on your back to sitting on the side of a flat bed without using bedrails?: Total Help needed moving to and from a bed to a chair (including a wheelchair)?: Total Help needed standing up from a chair using your arms (e.g., wheelchair or bedside chair)?: Total Help needed to walk in hospital room?: Total Help needed climbing 3-5 steps with a railing? : Total 6 Click Score: 6    End of Session Equipment Utilized During Treatment: Oxygen Activity Tolerance: Patient tolerated treatment well Patient left: in bed;with call bell/phone within reach;with bed alarm set;with restraints reapplied Nurse Communication: Mobility status;Need for lift equipment PT Visit Diagnosis: Other abnormalities of gait and mobility (R26.89);Muscle weakness (generalized) (M62.81);Hemiplegia and hemiparesis Hemiplegia - Right/Left: Right Hemiplegia - caused by: Nontraumatic SAH     Time: 1448-1856 PT Time Calculation (min) (ACUTE ONLY): 23 min  Charges:  $Therapeutic Activity: 23-37 mins                  Arlyss Gandy, PT, DPT Acute Rehabilitation Office [phone]    Arlyss Gandy 06/24/2021, 4:40 PM

## 2021-06-25 ENCOUNTER — Inpatient Hospital Stay (HOSPITAL_COMMUNITY): Payer: BC Managed Care – PPO

## 2021-06-25 ENCOUNTER — Telehealth: Payer: Self-pay | Admitting: Internal Medicine

## 2021-06-25 DIAGNOSIS — I609 Nontraumatic subarachnoid hemorrhage, unspecified: Secondary | ICD-10-CM | POA: Diagnosis not present

## 2021-06-25 DIAGNOSIS — E876 Hypokalemia: Secondary | ICD-10-CM

## 2021-06-25 DIAGNOSIS — J9600 Acute respiratory failure, unspecified whether with hypoxia or hypercapnia: Secondary | ICD-10-CM | POA: Diagnosis not present

## 2021-06-25 DIAGNOSIS — I608 Other nontraumatic subarachnoid hemorrhage: Secondary | ICD-10-CM | POA: Diagnosis not present

## 2021-06-25 LAB — CBC
HCT: 35 % — ABNORMAL LOW (ref 36.0–46.0)
Hemoglobin: 11.3 g/dL — ABNORMAL LOW (ref 12.0–15.0)
MCH: 29 pg (ref 26.0–34.0)
MCHC: 32.3 g/dL (ref 30.0–36.0)
MCV: 89.7 fL (ref 80.0–100.0)
Platelets: 146 10*3/uL — ABNORMAL LOW (ref 150–400)
RBC: 3.9 MIL/uL (ref 3.87–5.11)
RDW: 13.3 % (ref 11.5–15.5)
WBC: 13 10*3/uL — ABNORMAL HIGH (ref 4.0–10.5)
nRBC: 0 % (ref 0.0–0.2)

## 2021-06-25 LAB — BASIC METABOLIC PANEL
Anion gap: 7 (ref 5–15)
BUN: 20 mg/dL (ref 6–20)
CO2: 26 mmol/L (ref 22–32)
Calcium: 8.4 mg/dL — ABNORMAL LOW (ref 8.9–10.3)
Chloride: 107 mmol/L (ref 98–111)
Creatinine, Ser: 0.58 mg/dL (ref 0.44–1.00)
GFR, Estimated: >60 mL/min (ref >60)
Glucose, Bld: 132 mg/dL — ABNORMAL HIGH (ref 70–99)
Potassium: 3.3 mmol/L — ABNORMAL LOW (ref 3.5–5.1)
Sodium: 140 mmol/L (ref 135–145)

## 2021-06-25 LAB — GLUCOSE, CAPILLARY
Glucose-Capillary: 122 mg/dL — ABNORMAL HIGH (ref 70–99)
Glucose-Capillary: 110 mg/dL — ABNORMAL HIGH (ref 70–99)
Glucose-Capillary: 110 mg/dL — ABNORMAL HIGH (ref 70–99)
Glucose-Capillary: 126 mg/dL — ABNORMAL HIGH (ref 70–99)
Glucose-Capillary: 110 mg/dL — ABNORMAL HIGH (ref 70–99)
Glucose-Capillary: 117 mg/dL — ABNORMAL HIGH (ref 70–99)

## 2021-06-25 MED ORDER — ORAL CARE MOUTH RINSE
15.0000 mL | Freq: Two times a day (BID) | OROMUCOSAL | Status: DC
  Administered 2021-06-26 – 2021-07-15 (×40): 15 mL via OROMUCOSAL

## 2021-06-25 MED ORDER — POTASSIUM CHLORIDE 10 MEQ/100ML IV SOLN
10.0000 meq | INTRAVENOUS | Status: AC
  Administered 2021-06-25 (×4): 10 meq via INTRAVENOUS
  Filled 2021-06-25 (×3): qty 100

## 2021-06-25 MED ORDER — PROSOURCE TF PO LIQD
45.0000 mL | Freq: Two times a day (BID) | ORAL | Status: DC
  Administered 2021-06-25 – 2021-07-09 (×28): 45 mL
  Filled 2021-06-25 (×28): qty 45

## 2021-06-25 MED ORDER — CHLORHEXIDINE GLUCONATE 0.12 % MT SOLN
15.0000 mL | Freq: Two times a day (BID) | OROMUCOSAL | Status: DC
  Administered 2021-06-25 – 2021-07-16 (×42): 15 mL via OROMUCOSAL
  Filled 2021-06-25 (×29): qty 15

## 2021-06-25 MED ORDER — VITAL 1.5 CAL PO LIQD
1000.0000 mL | ORAL | Status: DC
  Administered 2021-06-26 – 2021-06-28 (×3): 1000 mL

## 2021-06-25 MED ORDER — POTASSIUM CHLORIDE 20 MEQ PO PACK
20.0000 meq | PACK | ORAL | Status: AC
  Administered 2021-06-25 (×2): 20 meq
  Filled 2021-06-25 (×2): qty 1

## 2021-06-25 NOTE — Progress Notes (Signed)
Occupational Therapy Treatment Patient Details Name: Yolanda Boone MRN: [id number] DOB: 04-07-1960 Today's Date: 06/25/2021   History of present illness 61 y.o. female presents to [facility] on 06/20/2021 after being found down by EMS. Pt with L gaze and posturing, hypoxic to 60s requiring emergent intubation 06/20/21-06/23/21 . CTH and CTA reveal large SAH with midline shift, concerning for L MCA aneurysm rupture. 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 No PMH on file.   OT comments  Pt progressing towards OT goals with increased activity tolerance. PROM for BUE and BLE. During RUE ROM Pt began mimicking with the left hand opening and closing her digits. Did not mimic any other exercises. She was presented with a wash cloth, and multimodal cues to wash her hands. Pt never engaged or acknowledged wash cloth. Hand over hand total A required in the end to wash hands and face. Able to come EOB with total A +2. She ranged between max A to min A for seated balance. When she was fatigued she starting using her left arm to push to the right . Pt did not follow any commands throughout session except for "hold this" and after 7 second delay she would take socks out of OT hand with her left hand (and then drop them) in attention on left side visually still present, performed cervical stretches and ROM while seated EOB. OT will continue to follow, POC remains appropriate.    Recommendations for follow up therapy are one component of a multi-disciplinary discharge planning process, led by the attending physician.  Recommendations may be updated based on patient status, additional functional criteria and insurance authorization.    Follow Up Recommendations  Acute inpatient rehab (3hours/day)    Assistance Recommended at Discharge Frequent or constant Supervision/Assistance  Patient can return home with the following  Two  people to help with walking and/or transfers;Two people to help with bathing/dressing/bathroom;Assistance with cooking/housework;Assistance with feeding;Direct supervision/assist for medications management;Direct supervision/assist for financial management;Assist for transportation;Help with stairs or ramp for entrance   Equipment Recommendations  Other (comment) (defer to next venue of care)    Recommendations for Other Services Other (comment) (Palliative)    Precautions / Restrictions Precautions Precautions: Fall Restrictions Weight Bearing Restrictions: No       Mobility Bed Mobility Overal bed mobility: Needs Assistance Bed Mobility: Supine to Sit, Sit to Supine     Supine to sit: Total assist, HOB elevated, +2 for physical assistance, +2 for safety/equipment Sit to supine: Total assist, HOB elevated, +2 for physical assistance, +2 for safety/equipment   General bed mobility comments: use of bed pad to facilitate, multimodal cues Pt continues to be total A +2 for all aspects of bed mobility    Transfers                   General transfer comment: no BLE activation, focused on sitting balance and attempts at command following     Balance Overall balance assessment: Needs assistance Sitting-balance support: Single extremity supported, Feet supported Sitting balance-Leahy Scale: Zero Sitting balance - Comments: initially max A progressed to min A of 2 for safety, fatigued requiring increased support back to max A, also pushing with left hand to the right Postural control: Posterior lean, Right lateral lean  ADL either performed or assessed with clinical judgement   ADL Overall ADL's : Needs assistance/impaired     Grooming: Wash/dry face;Wash/dry hands;Maximal assistance;Bed level Grooming Details (indicate cue type and reason): hand over hand, max/total A Pt did not attempt to use wash cloth correctly despite  verbal, visual cues, demonstration etc.             Lower Body Dressing: Total assistance                 General ADL Comments: Pt remains total A for all aspects of ADL    Extremity/Trunk Assessment Upper Extremity Assessment Upper Extremity Assessment: RUE deficits/detail;LUE deficits/detail RUE Deficits / Details: no movement noted today. PROM remains WFL RUE Sensation:  (attends to arm during PROM) RUE Coordination: decreased gross motor;decreased fine motor LUE Deficits / Details: spontaneous movement, able to bring to mouth, still attempting to pull lines - in mitt at end of session            Vision   Vision Assessment?: Vision impaired- to be further tested in functional context   Perception     Praxis      Cognition Arousal/Alertness: Awake/alert Behavior During Therapy: Flat affect Overall Cognitive Status: Impaired/Different from baseline Area of Impairment: Following commands, Safety/judgement, Awareness, Problem solving                       Following Commands: Follows one step commands inconsistently, Follows one step commands with increased time (about a 7 second delay) Safety/Judgement: Decreased awareness of safety, Decreased awareness of deficits Awareness: Intellectual Problem Solving: Slow processing, Decreased initiation, Requires verbal cues, Requires tactile cues General Comments: The only command that pt followed was "hold this" with a 7 second delay (using L hand). She also mimicked ROM in the left hand when PROM was being performed on the right. She will attend to the left side 33% of the time. Pt did not attempt to vocalize throughout session. Multiple facial expressions - but not consistent and not attempting to communicate through expression at this time        Exercises Exercises: Other exercises Other Exercises Other Exercises: PROM to BUE at the digits, wrist, elbow, shoulder Other Exercises: PROM for BLE ankles, knees,  hips    Shoulder Instructions       General Comments pt on 2L [place], RR elevated up to 38 during session, sats stable    Pertinent Vitals/ Pain       Pain Assessment Pain Assessment: Faces Faces Pain Scale: No hurt Pain Intervention(s): Monitored during session, Repositioned  Home Living                                          Prior Functioning/Environment              Frequency  Min 2X/week        Progress Toward Goals  OT Goals(current goals can now be found in the care plan section)  Progress towards OT goals: Progressing toward goals  Acute Rehab OT Goals Patient Stated Goal: "get her back" OT Goal Formulation: With family Time For Goal Achievement: 07/07/21 Potential to Achieve Goals: Good  Plan Discharge plan remains appropriate;Frequency remains appropriate    Co-evaluation    PT/OT/SLP Co-Evaluation/Treatment: Yes Reason for Co-Treatment: Complexity of the patient's impairments (multi-system involvement);Necessary to address cognition/behavior during functional activity;For patient/therapist  safety PT goals addressed during session: Mobility/safety with mobility;Balance;Strengthening/ROM OT goals addressed during session: ADL's and self-care;Strengthening/ROM      AM-PAC OT "6 Clicks" Daily Activity     Outcome Measure   Help from another person eating meals?: Total Help from another person taking care of personal grooming?: Total Help from another person toileting, which includes using toliet, bedpan, or urinal?: Total Help from another person bathing (including washing, rinsing, drying)?: Total Help from another person to put on and taking off regular upper body clothing?: Total Help from another person to put on and taking off regular lower body clothing?: Total 6 Click Score: 6    End of Session Equipment Utilized During Treatment: Oxygen (2L via [place])  OT Visit Diagnosis: Other symptoms and signs involving cognitive  function;Other symptoms and signs involving the nervous system (R29.898)   Activity Tolerance Patient tolerated treatment well   Patient Left in bed;with call bell/phone within reach;with bed alarm set;with family/visitor present;with SCD's reapplied;with restraints reapplied   Nurse Communication Mobility status;Other (comment) (RR and HR staying within parameters)        Time: [id number] OT Time Calculation (min): 30 min  Charges: OT General Charges $OT Visit: 1 Visit OT Treatments $Self Care/Home Management : 8-22 mins  Nyoka CowdenLaura H OTR/L Acute Rehabilitation Services Pager: [phone] Office: [phone]  Evern BioLaura J Yaretsi Humphres 06/25/2021, 1:15 PM

## 2021-06-25 NOTE — Progress Notes (Addendum)
Speech Language Pathology Treatment: Dysphagia;Cognitive-Linquistic (aphasia)  Patient Details Name: Yolanda Boone MRN: [id number] DOB: 1960-01-29 Today's Date: 06/25/2021 Time: 8315-1761 SLP Time Calculation (min) (ACUTE ONLY): 32 min  Assessment / Plan / Recommendation Clinical Impression  Pt was seen for treatment. She was adequately alert for the majority of the session, but was noted to fall asleep intermittently during mastication. Per the pt's daughter, she demonstrated this behavior prior to admission and would then need to be roused to continue eating. Pt continues to be appropriately demonstrate smiling in response to humor and some statements. However, despite use of facilitating techniques, she did not communicate verbally or respond to questions with use of modeled gestures. Pt demonstrated prolonged mastication of dysphagia 2 and dysphagia 3 solids. Pocketing with these consistencies was reduced, but not eliminated, with a liquid wash and with pt's independent use of lingual sweeps. No overt s/sx of aspiration were noted with solids or with liquids. However, pt's RN expressed concerns regarding tachypnea after p.o. intake and stated that PCCM will be making the pt NPO and holding tube feeds due to concern for aspiration. RN was advised was agreed that SLP will complete an instrumental assessment next week to rule out silent aspiration if it appears to be clinically indicated at that time.    HPI HPI: Pt is a 61 y.o. female who presented to the ED on 06/20/21 after being found down by EMS. Left gaze, posturing, and hypoxia the 60's noted in the ED and pt intubated. ETT 5/28-5/31 CT head and CTA revealed large SAH with midline shift concerning for left MCA aneurysm rupture. 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 No PMH in chart.      SLP Plan  Continue with current plan of care       Recommendations for follow up therapy are one component of a multi-disciplinary discharge planning process, led by the attending physician.  Recommendations may be updated based on patient status, additional functional criteria and insurance authorization.    Recommendations  Diet recommendations: Dysphagia 1 (puree);Thin liquid Liquids provided via: Cup;Straw Medication Administration: Crushed with puree (or via cortrak) Supervision: Staff to assist with self feeding Compensations: Slow rate;Small sips/bites Postural Changes and/or Swallow Maneuvers: Seated upright 90 degrees                Oral Care Recommendations: Oral care BID;Staff/trained caregiver to provide oral care Follow Up Recommendations: Acute inpatient rehab (3hours/day) Assistance recommended at discharge: Frequent or constant Supervision/Assistance SLP Visit Diagnosis: Dysphagia, unspecified (R13.10) Plan: Continue with current plan of care          Yolanda Boone I. Vear Clock, MS, CCC-SLP Acute Rehabilitation Services Office number [phone] Pager [phone]  Yolanda Boone  06/25/2021, 1:18 PM

## 2021-06-25 NOTE — Progress Notes (Signed)
NAME:  Yolanda Boone, MRN:  [id number], DOB:  11/01/1960, LOS: 5 ADMISSION DATE:  06/20/2021, CONSULTATION DATE:  5/28 REFERRING MD:  Donavan Burnet FOR CONSULT:  Vent and medical management   History of Present Illness:  Patient is encephalopathic and/or intubated. Therefore history has been obtained from chart review.   Yolanda Boone, is a 61 y.o. female, who presented to the [facility] ED with a chief complaint of being found unresponsive.  Last known normal 1615. Per report was active earlier in day. Found down later in evening. EMS called. Patient hypoxic with sats of 60% during transport.  ED course was notable for left gaze preference and posturing on arrival. Neuro was consulted. They were intubated in the ED. [facility] concerning for large volume subarachnoid hemorrhage, greatest in the left sylvian fissure, mass effect with 4 mm rightward shift.  PCCM was consulted for assistance with ventilator and medical management  Pertinent  Medical History  HTN, Thoracic aneurysm, gastric bypass, history of Graves disease.  Significant Hospital Events: Including procedures, antibiotic start and stop dates in addition to other pertinent events   5/28 present to [facility], PCCM consult, ETT 5/28 [facility] 4 SAH from L MCA aneurysm 5/28 Coil embolization  5/31 extubated  Interim History / Subjective:   Remains globally aphasic Spiked fever with Tmax 101.9 though white count is trending down  Objective   Blood pressure 116/65, pulse 81, temperature 98.2 F (36.8 C), temperature source Oral, resp. rate (!) 29, height 5\' 7"  (1.702 m), weight 112.5 kg, SpO2 91 %.        Intake/Output Summary (Last 24 hours) at 06/25/2021 0900 Last data filed at 06/25/2021 0800 Gross per 24 hour  Intake 4179.26 ml  Output 3800 ml  Net 379.26 ml   Filed Weights   06/20/21 1800 06/20/21 2200 06/24/21 0600  Weight: 95 kg 114.4 kg 112.5 kg    Examination: Physical exam: General: Middle-age female, lying in bed.   HEENT: SBFT in place, no pressure injury. Neuro: Awake and tracks to voice, but not able to follow commands. Moves all limbs spontaneously except for RUE.  Chest: Clear to auscultation bilaterally, no wheezes or rhonchi Heart: Regular rate and rhythm, no murmurs or gallops Abdomen: Soft, nontender, nondistended, bowel sounds present Ext: right groin angio site intact with no bruit. Right TKA scar.  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, s/p coil embolization Compression of brain due to nontraumatic subarachnoid hemorrhage (HCC) Acute left MCA territory stroke Now post bleed day 5 No vasospasm on angiogram Daily TCD's Echo normal 5/30  Continue neuro watch every hour Neurosurgery is following Maintain normothermia, euvolemia and normal natremia Allow permissive hypertension with SBP 120-180 Repeat CT head suggestive of left MCA territory stroke  Aspiration pneumonitis (HCC) Complete 5 days of antibiotics  Acute respiratory failure (HCC) Patient was extubated on 5/31, remains on nasal cannula oxygen Titrate oxygen with O2 sat goal 92%  Dysphagia related to subarachnoid hemorrhage and left MCA stroke Continue dysphagia type I diet  Hx of aortic aneurysm No sign of rupture, no pulse deficits.  Best Practice (right click and "Reselect all SmartList Selections" daily)   Diet/type: tubefeeds and dysphagia diet (see orders) DVT prophylaxis: prophylactic heparin  GI prophylaxis: PPI Lines: N/A Foley:  removal ordered  Code Status:  full code Last date of multidisciplinary goals of care discussion [Per primary team]     6/31  MD [place] [facility] See Amion for pager If no response to pager, please call [phone] until 7pm After 7pm, Please call E-link [phone]

## 2021-06-25 NOTE — Progress Notes (Signed)
Orthopedic Tech Progress Note Patient Details:  Yolanda Boone March 22, 1960 [id number]  Ortho Devices Type of Ortho Device: Prafo boot/shoe Ortho Device/Splint Location: BLE Ortho Device/Splint Interventions: Ordered, Application, Adjustment   Post Interventions Patient Tolerated: Well  Yolanda Boone 06/25/2021, 4:12 PM

## 2021-06-25 NOTE — Progress Notes (Signed)
Verbal order from Dr Merrily Pew to keep patient NPO until tomorrow due to possible aspiration. After patient's daughter fed patient this morning her appropriate diet, I noticed the patient became tachycardic and tachypnic - which is new for the patient. Patient has minor labored breathing. SpO2 97% on 3L [place]. We will keep patient NPO for now, including Tube feed, to hopefully prevent further aspiration if that is what is happening. Will reassess tomorrow. SLP Yvone Neu notified.   Sherral Hammers RN

## 2021-06-25 NOTE — Telephone Encounter (Signed)
Patient's daughter dropped FMLA forms at [facility] office. Patient is in ICU, Dr. Cheri Fowler is the attending.  Daughter Kirby Funk [phone].

## 2021-06-25 NOTE — Progress Notes (Signed)
  NEUROSURGERY PROGRESS NOTE   Pt seen and examined. No issues overnight. Daughter at bedside reports she has eaten breakfast and appears to attempt to mouth some words but remains non-verbal  EXAM: Temp:  [98.2 F (36.8 C)-102.2 F (39 C)] 98.2 F (36.8 C) (06/02 0800) Pulse Rate:  [70-96] 81 (06/02 0800) Resp:  [24-34] 29 (06/02 0800) BP: (116-158)/(65-96) 116/65 (06/02 0800) SpO2:  [91 %-99 %] 91 % (06/02 0800) Intake/Output      06/01 0701 06/02 0700 06/02 0701 06/03 0700   I.V. (mL/kg) 2388.2 (21.2) 196.9 (1.8)   NG/GT 960 80   IV Piggyback 832.4 88.3   Total Intake(mL/kg) 4180.6 (37.2) 365.2 (3.2)   Urine (mL/kg/hr) 3950 (1.5) 400 (1.1)   Stool 50    Total Output 4000 400   Net +180.6 -34.8        Urine Occurrence 1 x     Awake, alert Non-verbal Face symmetric Not-following commands Moves LUE/LLE spontaneously, 1/5 spontaneous proximal movement RUE (primarily shoulder abduction and rotation). Minimal spontaneous movement RLE  LABS: Lab Results  Component Value Date   CREATININE 0.58 06/25/2021   BUN 20 06/25/2021   NA 140 06/25/2021   K 3.3 (L) 06/25/2021   CL 107 06/25/2021   CO2 26 06/25/2021   Lab Results  Component Value Date   WBC 13.0 (H) 06/25/2021   HGB 11.3 (L) 06/25/2021   HCT 35.0 (L) 06/25/2021   MCV 89.7 06/25/2021   PLT 146 (L) 06/25/2021      IMPRESSION: - 61 y.o. female SAH d# 5 s/p coiling ruptured RMCA aneurysm, neurologically stable with LMCA terrirtory stroke and associated dense right hemiplegia and mixed aphasia  PLAN: - Cont supportive care, vasospasm monitoring - Cont PT/OT - Cont Nimotop - Can liberalize SBP parameter, <180mmHg    Lisbeth Renshaw, MD [facility]

## 2021-06-25 NOTE — Progress Notes (Signed)
Physical Therapy Treatment Patient Details Name: Yolanda Boone MRN: [id number] DOB: 02-10-1960 Today's Date: 06/25/2021   History of Present Illness 61 y.o. female presents to [facility] hospital on 06/20/2021 after being found down by EMS. Pt with L gaze and posturing, hypoxic to 60s requiring emergent intubation 06/20/21-06/23/21 . CTH and CTA reveal large SAH with midline shift, concerning for L MCA aneurysm rupture. 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 No PMH on file.    PT Comments    Pt tolerates treatment well although she remains unable to follow commands. Pt moves LUE spontaneously but does not appear to move other extremities during session. Pt remains totalA for all mobility with R gaze preference. Pt will benefit from continued acute PT services in an effort to redcue falls risk and caregiver burden. Pt very inconsistently mimics during session. If pt does not demonstrate the ability to follow commands by Monday will likely need to consider SNF placement.   Recommendations for follow up therapy are one component of a multi-disciplinary discharge planning process, led by the attending physician.  Recommendations may be updated based on patient status, additional functional criteria and insurance authorization.  Follow Up Recommendations  Acute inpatient rehab (3hours/day) (if pt demonstrate no ability to follow commands by monday will need to consider SNF placement)     Assistance Recommended at Discharge Frequent or constant Supervision/Assistance  Patient can return home with the following Two people to help with walking and/or transfers;Two people to help with bathing/dressing/bathroom;Assistance with cooking/housework;Assistance with feeding;Direct supervision/assist for medications management;Direct supervision/assist for financial management;Assist for transportation;Help with stairs or ramp for  entrance   Equipment Recommendations  Wheelchair (measurements PT);Wheelchair cushion (measurements PT);Hospital bed (hoyer lift)    Recommendations for Other Services       Precautions / Restrictions Precautions Precautions: Fall Restrictions Weight Bearing Restrictions: No     Mobility  Bed Mobility Overal bed mobility: Needs Assistance Bed Mobility: Supine to Sit, Sit to Supine     Supine to sit: Total assist, +2 for physical assistance, HOB elevated Sit to supine: Total assist, +2 for physical assistance, HOB elevated        Transfers                        Ambulation/Gait                   Stairs             Wheelchair Mobility    Modified Rankin (Stroke Patients Only) Modified Rankin (Stroke Patients Only) Pre-Morbid Rankin Score: No symptoms Modified Rankin: Severe disability     Balance Overall balance assessment: Needs assistance Sitting-balance support: Single extremity supported, Feet supported Sitting balance-Leahy Scale: Poor Sitting balance - Comments: minA initially, with fatigue pt begins to push posteriorly and to R side, requiring maxA to maintain upright Postural control: Posterior lean, Right lateral lean                                  Cognition Arousal/Alertness: Awake/alert Behavior During Therapy: Flat affect Overall Cognitive Status: Impaired/Different from baseline Area of Impairment: Following commands, Awareness, Problem solving                       Following Commands:  (does not follow commands) Safety/Judgement: Decreased awareness of  deficits, Decreased awareness of safety Awareness: Intellectual Problem Solving: Slow processing, Decreased initiation          Exercises Other Exercises Other Exercises: PROM to BUE at the digits, wrist, elbow, shoulder Other Exercises: PROM for BLE ankles, knees, hips    General Comments General comments (skin integrity, edema, etc.):  pt on 2L [place], RR elevated up to 38 during session, sats stable      Pertinent Vitals/Pain Pain Assessment Pain Assessment: Faces Faces Pain Scale: No hurt    Home Living                          Prior Function            PT Goals (current goals can now be found in the care plan section) Acute Rehab PT Goals Patient Stated Goal: to return to independence Progress towards PT goals: Not progressing toward goals - comment (limited by inability to follow commands)    Frequency    Min 3X/week      PT Plan Current plan remains appropriate    Co-evaluation PT/OT/SLP Co-Evaluation/Treatment: Yes Reason for Co-Treatment: Complexity of the patient's impairments (multi-system involvement);Necessary to address cognition/behavior during functional activity;For patient/therapist safety PT goals addressed during session: Mobility/safety with mobility;Balance;Strengthening/ROM OT goals addressed during session: ADL's and self-care;Strengthening/ROM      AM-PAC PT "6 Clicks" Mobility   Outcome Measure  Help needed turning from your back to your side while in a flat bed without using bedrails?: Total Help needed moving from lying on your back to sitting on the side of a flat bed without using bedrails?: Total Help needed moving to and from a bed to a chair (including a wheelchair)?: Total Help needed standing up from a chair using your arms (e.g., wheelchair or bedside chair)?: Total Help needed to walk in hospital room?: Total Help needed climbing 3-5 steps with a railing? : Total 6 Click Score: 6    End of Session Equipment Utilized During Treatment: Oxygen Activity Tolerance: Patient tolerated treatment well Patient left: in bed;with call bell/phone within reach;with bed alarm set;with restraints reapplied Nurse Communication: Mobility status;Need for lift equipment PT Visit Diagnosis: Other abnormalities of gait and mobility (R26.89);Muscle weakness (generalized)  (M62.81);Hemiplegia and hemiparesis Hemiplegia - Right/Left: Right Hemiplegia - caused by: Nontraumatic SAH     Time: [id number] PT Time Calculation (min) (ACUTE ONLY): 23 min  Charges:  $Therapeutic Activity: 8-22 mins                     Zenaida Niece, PT, DPT Acute Rehabilitation Office [place] Calynn Ferrero 06/25/2021, 1:17 PM

## 2021-06-25 NOTE — Progress Notes (Signed)
Nutrition Follow-up  DOCUMENTATION CODES:   Obesity unspecified  INTERVENTION:    Recommend resume (TF orders adjusted for when TF resumed) Vital 1.5 @ 50 ml/hr (1200 ml/day) 45 ml ProSource TF BID  Provides: 1880 kcal, 105 grams protein, and 912 ml free water.   MVI with minerals BID to meet bariatric guidelines Calcium citrate with vitamin D 500 mg TID to meet bariatric guidelines   NUTRITION DIAGNOSIS:   Inadequate oral intake related to inability to eat as evidenced by NPO status. Ongoing.   GOAL:   Patient will meet greater than or equal to 90% of their needs Not met.   MONITOR:   Vent status, TF tolerance  REASON FOR ASSESSMENT:   Consult Enteral/tube feeding initiation and management  ASSESSMENT:   Pt with PMH of HTN, thoracic aneurysm, gastric bypass, and grave's dz who was admitted with [facility] s/p coiling of ruptured R MCA aneurysm.    Pt had signs of aspiration after being fed this am. Pt now NPO and TF held.  Spoke with dauhther who reports that the pt lives alone. She thinks the pt takes a gummy vitamin but is unsure what type it is.   5/30 s/p cortrak placement, per xray tip in gastric body, question if this is the gastric pouch nothing noted about gastric bypass in xray.  6/1 diet advanced to Dysphagia 1 with thin liquids; ate 100% of dinner 6/2 aspiration event, NPO and TF held  Medications reviewed and include: oscal with D (500-5) TID, MVI with minerals, nimotop, protonix  Labs reviewed: K 3.3 Vitamin D: 22.88 CBG: 110-138   Diet Order:   Diet Order             Diet NPO time specified Except for: Other (See Comments)  Diet effective now                   EDUCATION NEEDS:   No education needs have been identified at this time  Skin:  Skin Assessment: Reviewed RN Assessment  Last BM:  50 ml via rectal tube  Height:   Ht Readings from Last 1 Encounters:  06/20/21 $RemoveB'5\' 7"'QjuLsNil$  (1.702 m)    Weight:   Wt Readings from Last 1  Encounters:  06/24/21 112.5 kg    BMI:  Body mass index is 38.85 kg/m.  Estimated Nutritional Needs:   Kcal:  1800-2000  Protein:  95-120 grams  Fluid:  >1.8 L/day  [name]., RD, LDN, CNSC See AMiON for contact information

## 2021-06-25 NOTE — Progress Notes (Signed)
[facility] ADULT ICU REPLACEMENT PROTOCOL   The patient does apply for the [facility] Adult ICU Electrolyte Replacment Protocol based on the criteria listed below:   1.Exclusion criteria: TCTS patients, ECMO patients, and Dialysis patients 2. Is GFR >/= 30 ml/min? Yes.    Patient's GFR today is >60 3. Is SCr </= 2? Yes.   Patient's SCr is 0.58 mg/dL 4. Did SCr increase >/= 0.5 in 24 hours? No. 5.Pt's weight >40kg  Yes.   6. Abnormal electrolyte(s): K+ 3.3  7. Electrolytes replaced per protocol 8.  Call MD STAT for K+ </= 2.5, Phos </= 1, or Mag </= 1 Physician:  Carleene Mains 06/25/2021 2:51 AM

## 2021-06-25 NOTE — Progress Notes (Signed)
Transcranial Doppler   Date POD PCO2 HCT BP   MCA ACA PCA OPHT SIPH VERT Basilar  5/29 RH         Right  Left   *  *   *  *   *  *   9  11   48  40   *  *   *       5/30 [facility]          Right  Left    *   *    *   *    *   *    14   14    *   10    -32   -49    -46       6/2 RH          Right  Left    *   *    *   *    *   *    22   17    *   46    -16   -26    -22                   Right  Left                                                                 Right  Left                                                               Right  Left                                                               Right  Left                                                       MCA = Middle Cerebral Artery      OPHT = Opthalmic Artery     BASILAR = Basilar Artery   ACA = Anterior Cerebral Artery     SIPH = Carotid Siphon PCA = Posterior Cerebral Artery   VERT = Verterbral Artery                    Normal MCA = 62+\-12 ACA = 50+\-12 PCA = 42+\-23    *= Unable to insonate   Darlin Coco, RDMS, RVT

## 2021-06-26 DIAGNOSIS — J9601 Acute respiratory failure with hypoxia: Secondary | ICD-10-CM | POA: Diagnosis not present

## 2021-06-26 LAB — GLUCOSE, CAPILLARY
Glucose-Capillary: 109 mg/dL — ABNORMAL HIGH (ref 70–99)
Glucose-Capillary: 94 mg/dL (ref 70–99)
Glucose-Capillary: 114 mg/dL — ABNORMAL HIGH (ref 70–99)
Glucose-Capillary: 120 mg/dL — ABNORMAL HIGH (ref 70–99)
Glucose-Capillary: 112 mg/dL — ABNORMAL HIGH (ref 70–99)
Glucose-Capillary: 148 mg/dL — ABNORMAL HIGH (ref 70–99)

## 2021-06-26 LAB — BASIC METABOLIC PANEL
Anion gap: 10 (ref 5–15)
BUN: 19 mg/dL (ref 6–20)
CO2: 28 mmol/L (ref 22–32)
Calcium: 8.9 mg/dL (ref 8.9–10.3)
Chloride: 103 mmol/L (ref 98–111)
Creatinine, Ser: 0.73 mg/dL (ref 0.44–1.00)
GFR, Estimated: >60 mL/min (ref >60)
Glucose, Bld: 114 mg/dL — ABNORMAL HIGH (ref 70–99)
Potassium: 3.8 mmol/L (ref 3.5–5.1)
Sodium: 141 mmol/L (ref 135–145)

## 2021-06-26 LAB — CBC
HCT: 35.6 % — ABNORMAL LOW (ref 36.0–46.0)
Hemoglobin: 11.6 g/dL — ABNORMAL LOW (ref 12.0–15.0)
MCH: 29.1 pg (ref 26.0–34.0)
MCHC: 32.6 g/dL (ref 30.0–36.0)
MCV: 89.4 fL (ref 80.0–100.0)
Platelets: 151 10*3/uL (ref 150–400)
RBC: 3.98 MIL/uL (ref 3.87–5.11)
RDW: 13.2 % (ref 11.5–15.5)
WBC: 14 10*3/uL — ABNORMAL HIGH (ref 4.0–10.5)
nRBC: 0 % (ref 0.0–0.2)

## 2021-06-26 MED ORDER — WHITE PETROLATUM EX OINT
TOPICAL_OINTMENT | CUTANEOUS | Status: DC | PRN
  Filled 2021-06-26 (×2): qty 28.35

## 2021-06-26 NOTE — Progress Notes (Signed)
Patient ID: Yolanda Boone, female   DOB: [birth year], 61 y.o.   MRN: [id number] Patient is awake however does not follow commands appears to be dysphasic  Aphasic right hemiplegia  Continue supportive care no new neurosurgical interventions

## 2021-06-26 NOTE — TOC Initial Note (Signed)
Transition of Care [facility]) - Initial/Assessment Note    Patient Details  Name: Yolanda Boone MRN: [id number] Date of Birth: 19-Feb-1960  [facility] CM/SW Contact:    Mearl Latin, LCSW Phone Number: 06/26/2021, 8:15 AM  Clinical Narrative:                 CSW received request to speak with patient's daughter at bedside. CSW spoke with Kathlen Mody (patient's brother also present) and answered questions about assistance paying bills. CSW provided info on applying for Medicaid through Department of Social Services. CSW will continue to follow for rehab needs once closer to discharge.   Expected Discharge Plan: IP Rehab Facility Barriers to Discharge: Continued Medical Work up, English as a second language teacher   Patient Goals and CMS Choice Patient states their goals for this hospitalization and ongoing recovery are:: Rehab CMS Medicare.gov Compare Post Acute Care list provided to:: Patient Represenative (must comment) Choice offered to / list presented to : Adult Children  Expected Discharge Plan and Services Expected Discharge Plan: IP Rehab Facility In-house Referral: Clinical Social Work     Living arrangements for the past 2 months: Single Family Home                                      Prior Living Arrangements/Services Living arrangements for the past 2 months: Single Family Home Lives with:: Self Patient language and need for interpreter reviewed:: Yes Do you feel safe going back to the place where you live?: Yes      Need for Family Participation in Patient Care: Yes (Comment) Care giver support system in place?: Yes (comment)   Criminal Activity/Legal Involvement Pertinent to Current Situation/Hospitalization: No - Comment as needed  Activities of Daily Living      Permission Sought/Granted Permission sought to share information with : Facility Medical sales representative, Family Supports Permission granted to share information with : No  Share Information  with NAME: Ariel     Permission granted to share info w Relationship: Daughter  Permission granted to share info w Contact Information: [phone]  Emotional Assessment Appearance:: Appears stated age Attitude/Demeanor/Rapport: Unable to Assess Affect (typically observed): Unable to Assess Orientation: :  (unable to follow commands) Alcohol / Substance Use: Not Applicable Psych Involvement: No (comment)  Admission diagnosis:  Subarachnoid hemorrhage (HCC) [I60.9] Aortic dissection (HCC) [I71.00] Aneurysmal subarachnoid hemorrhage (HCC) [I60.8] Acute respiratory failure, unspecified whether with hypoxia or hypercapnia (HCC) [J96.00] Patient Active Problem List   Diagnosis Date Noted   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, left (HCC) 06/22/2021   Acute respiratory failure (HCC) 06/22/2021   Aspiration pneumonitis (HCC) 06/22/2021   Compression of brain due to nontraumatic subarachnoid hemorrhage (HCC) 06/22/2021   Hypokalemia 06/22/2021   Hx of aortic aneurysm 06/22/2021   Acute respiratory failure with hypoxia (HCC)    Hx of repair of dissecting thoracic aortic aneurysm, Stanford type B 06/02/2021   Closed fracture of fifth metatarsal bone 08/06/2020   Graves disease 10/08/2019   Essential hypertension 10/03/2012   Allergic rhinitis 12/19/2011   PCP:  Pcp, No Pharmacy:   [facility] [phone] - [place], [place] - [address] AT [facility] [address] [place] [address] Phone: [phone] Fax: [phone]     Social Determinants of Health (SDOH) Interventions    Readmission Risk Interventions     View : No data to display.

## 2021-06-26 NOTE — Progress Notes (Signed)
NAME:  Yolanda Boone, MRN:  [id number], DOB:  08-Mar-1960, LOS: 6 ADMISSION DATE:  06/20/2021, CONSULTATION DATE:  5/28 REFERRING MD:  Donavan Burnet FOR CONSULT:  Vent and medical management   History of Present Illness:  Patient is encephalopathic and/or intubated. Therefore history has been obtained from chart review.   Yolanda Boone, is a 61 y.o. female, who presented to the [facility] ED with a chief complaint of being found unresponsive.  Last known normal 1615. Per report was active earlier in day. Found down later in evening. EMS called. Patient hypoxic with sats of 60% during transport.  ED course was notable for left gaze preference and posturing on arrival. Neuro was consulted. They were intubated in the ED. [facility] concerning for large volume subarachnoid hemorrhage, greatest in the left sylvian fissure, mass effect with 4 mm rightward shift.  PCCM was consulted for assistance with ventilator and medical management  Pertinent  Medical History  HTN, Thoracic aneurysm, gastric bypass, history of Graves disease.  Significant Hospital Events: Including procedures, antibiotic start and stop dates in addition to other pertinent events   5/28 present to [facility], PCCM consult, ETT 5/28 [facility] 4 SAH from L MCA aneurysm 5/28 Coil embolization  5/31 extubated 6/3 concern for possible repeat aspiration 6/2 prompting n.p.o. status and holding tube feeds overnight.  Did spike temperature of 101.7 once but pulmonary exam is benign  Interim History / Subjective:  Remains globally aphasic unable to follow any commands  Objective   Blood pressure 125/81, pulse 86, temperature 99.2 F (37.3 C), temperature source Oral, resp. rate 19, height 5\' 7"  (1.702 m), weight 112.5 kg, SpO2 97 %.        Intake/Output Summary (Last 24 hours) at 06/26/2021 0804 Last data filed at 06/26/2021 0600 Gross per 24 hour  Intake 1276.04 ml  Output 2100 ml  Net -823.96 ml    Filed Weights   06/20/21 1800 06/20/21  2200 06/24/21 0600  Weight: 95 kg 114.4 kg 112.5 kg    Examination: General: Acute on chronic ill-appearing middle-aged female lying in bed in no acute distress HEENT: [place]/AT, MM pink/moist, PERRL,  Neuro: Eyes spontaneously open and tracks movement in room but unable to follow any commands CV: s1s2 regular rate and rhythm, no murmur, rubs, or gallops,  PULM: Clear to auscultation bilaterally, no increased work of breathing, no added breath sounds, on 2 L nasal cannula GI: soft, bowel sounds active in all 4 quadrants, non-tender, non-distended, tolerating TF Extremities: warm/dry, no edema  Skin: no rashes or lesions  Resolved problems:  Aspiration pneumonitis  Complete 5 days of antibiotics Acute respiratory failure (HCC) Patient was extubated on 5/3  Assessment & Plan:   H/H4, MF 3 subarachnoid hemorrhage  -Due to ruptured left middle cerebral artery aneurysm, s/p coil embolization Compression of brain due to nontraumatic subarachnoid hemorrhage  Acute left MCA territory stroke -Now post bleed day 5 -No vasospasm on angiogram -Echo normal 5/30  P: Primary management per neurosurgery Continue daily TCD's Maintain neuro protective measures; goal for eurothermia, euglycemia, eunatermia, normoxia, and PCO2 goal of 35-40 Nutrition and bowel regiment  Seizure precautions  AEDs per neurology  Continue Nimotop Aspirations precautions  Hypertension Repeat head CT per neurosurgery  Acute respiratory failure  -Patient was extubated on 5/3 Aspiration pneumonitis  -Complete 5 days of antibiotics -Plan for possible repeat aspiration 6/2 on dysphagia diet.  Patient placed n.p.o. apart from tube feeds P: Continue supplemental oxygen and, wean for SPO2 goal greater than 92  Head of bed elevated 30 degrees Follow intermittent chest x-ray and ABG Ensure adequate pulmonary hygiene  Follow cultures  Monitor CBC and fever curve closely for possible need of resumption of IV  antibiotics Repeat chest x-ray in a.m. Reassessed by SLP prior to resumption of oral diet  Dysphagia related to subarachnoid hemorrhage and left MCA stroke P: Continue dysphagia type I diet  Hx of aortic aneurysm -No sign of rupture, no pulse deficits. P: Continue supportive care  Best Practice (right click and "Reselect all SmartList Selections" daily)   Diet/type: tubefeeds and dysphagia diet (see orders) DVT prophylaxis: prophylactic heparin  GI prophylaxis: PPI Lines: N/A Foley:  removal ordered  Code Status:  full code Last date of multidisciplinary goals of care discussion [Per primary team]  Signature  Nobuo Nunziata D. Tiburcio Pea, NP-C [facility] Personal contact information can be found on Amion  06/26/2021, 8:05 AM

## 2021-06-27 DIAGNOSIS — I6012 Nontraumatic subarachnoid hemorrhage from left middle cerebral artery: Secondary | ICD-10-CM | POA: Diagnosis not present

## 2021-06-27 LAB — GLUCOSE, CAPILLARY
Glucose-Capillary: 113 mg/dL — ABNORMAL HIGH (ref 70–99)
Glucose-Capillary: 122 mg/dL — ABNORMAL HIGH (ref 70–99)
Glucose-Capillary: 106 mg/dL — ABNORMAL HIGH (ref 70–99)
Glucose-Capillary: 140 mg/dL — ABNORMAL HIGH (ref 70–99)
Glucose-Capillary: 118 mg/dL — ABNORMAL HIGH (ref 70–99)
Glucose-Capillary: 123 mg/dL — ABNORMAL HIGH (ref 70–99)

## 2021-06-27 LAB — BASIC METABOLIC PANEL
Anion gap: 6 (ref 5–15)
BUN: 24 mg/dL — ABNORMAL HIGH (ref 6–20)
CO2: 30 mmol/L (ref 22–32)
Calcium: 8.7 mg/dL — ABNORMAL LOW (ref 8.9–10.3)
Chloride: 105 mmol/L (ref 98–111)
Creatinine, Ser: 0.61 mg/dL (ref 0.44–1.00)
GFR, Estimated: >60 mL/min (ref >60)
Glucose, Bld: 126 mg/dL — ABNORMAL HIGH (ref 70–99)
Potassium: 3.6 mmol/L (ref 3.5–5.1)
Sodium: 141 mmol/L (ref 135–145)

## 2021-06-27 LAB — CBC
HCT: 34.7 % — ABNORMAL LOW (ref 36.0–46.0)
Hemoglobin: 11.2 g/dL — ABNORMAL LOW (ref 12.0–15.0)
MCH: 29 pg (ref 26.0–34.0)
MCHC: 32.3 g/dL (ref 30.0–36.0)
MCV: 89.9 fL (ref 80.0–100.0)
Platelets: 184 10*3/uL (ref 150–400)
RBC: 3.86 MIL/uL — ABNORMAL LOW (ref 3.87–5.11)
RDW: 13.1 % (ref 11.5–15.5)
WBC: 12 10*3/uL — ABNORMAL HIGH (ref 4.0–10.5)
nRBC: 0 % (ref 0.0–0.2)

## 2021-06-27 LAB — TRIGLYCERIDES: Triglycerides: 68 mg/dL (ref <150)

## 2021-06-27 NOTE — Progress Notes (Signed)
Subjective: Patient reports  remains nonverbal  Objective: Vital signs in last 24 hours: Temp:  [98.4 F (36.9 C)-101.1 F (38.4 C)] 98.4 F (36.9 C) (06/04 0729) Pulse Rate:  [52-97] 88 (06/04 0800) Resp:  [19-30] 23 (06/04 0800) BP: (115-145)/(69-90) 123/71 (06/04 0800) SpO2:  [94 %-99 %] 94 % (06/04 0800)  Intake/Output from previous day: 06/03 0701 - 06/04 0700 In: 1327.5 [NG/GT:1127.5; IV Piggyback:200] Out: 1250 [Urine:900; Stool:350] Intake/Output this shift: No intake/output data recorded.  Awake aphasic right paretic  Lab Results: Recent Labs    06/26/21 0334 06/27/21 0323  WBC 14.0* 12.0*  HGB 11.6* 11.2*  HCT 35.6* 34.7*  PLT 151 184   BMET Recent Labs    06/26/21 0334 06/27/21 0323  NA 141 141  K 3.8 3.6  CL 103 105  CO2 28 30  GLUCOSE 114* 126*  BUN 19 24*  CREATININE 0.73 0.61  CALCIUM 8.9 8.7*    Studies/Results: VAS [place] TRANSCRANIAL DOPPLER  Result Date: 06/26/2021  Transcranial Doppler Patient Name:  Yolanda Boone  Date of Exam:   06/25/2021 Medical Rec #: [id number]      Accession #:    [id number] Date of Birth: [birth year]     Patient Gender: F Patient Age:   61 years Exam Location:  [facility] Procedure:      VAS [place] TRANSCRANIAL DOPPLER Referring Phys: Vonna Kotyk MCDANIEL --------------------------------------------------------------------------------  Indications: Subarachnoid hemorrhage. Limitations: Patient unable to remain stationary/follow commands at this time. Limitations for diagnostic windows: Unable to insonate right transtemporal window. Unable to insonate left transtemporal window. Performing Technologist: Darlin Coco RDMS, RVT  Examination Guidelines: A complete evaluation includes B-mode imaging, spectral Doppler, color Doppler, and power Doppler as needed of all accessible portions of each vessel. Bilateral testing is considered an integral part of a complete examination. Limited examinations for reoccurring indications may be  performed as noted.  +----------+-------------+----------+-----------+------------------+ RIGHT TCD Right VM (cm)Depth (cm)Pulsatility     Comment       +----------+-------------+----------+-----------+------------------+ MCA                                         Unable to insonate +----------+-------------+----------+-----------+------------------+ ACA                                         Unable to insonate +----------+-------------+----------+-----------+------------------+ Term ICA                                    Unable to insonate +----------+-------------+----------+-----------+------------------+ PCA                                         Unable to insonate +----------+-------------+----------+-----------+------------------+ Opthalmic     22.00                 1.38                       +----------+-------------+----------+-----------+------------------+ ICA siphon                                  Unable  to insonate +----------+-------------+----------+-----------+------------------+ Vertebral    -16.00                 0.61                       +----------+-------------+----------+-----------+------------------+  +----------+------------+----------+-----------+------------------+ LEFT TCD  Left VM (cm)Depth (cm)Pulsatility     Comment       +----------+------------+----------+-----------+------------------+ MCA                                        Unable to insonate +----------+------------+----------+-----------+------------------+ ACA                                        Unable to insonate +----------+------------+----------+-----------+------------------+ Term ICA                                   Unable to insonate +----------+------------+----------+-----------+------------------+ PCA                                        Unable to insonate +----------+------------+----------+-----------+------------------+  Opthalmic    17.00                 1.28                       +----------+------------+----------+-----------+------------------+ ICA siphon   46.00                 0.61                       +----------+------------+----------+-----------+------------------+ Vertebral    -26.00                0.56                       +----------+------------+----------+-----------+------------------+  +------------+------+-------+             VM cm Comment +------------+------+-------+ Prox Basilar-22.00        +------------+------+-------+ Summary:  Absent bitemporal and poor orbital windows limits exam. antegrade flow noted in right opthalmic, vertebral and left opthalmic,carotid siphon, vertebral and basuilar arteries. No definite evidence of vasospasm noted. *See table(s) above for TCD measurements and observations.  Diagnosing physician: Antony Contras MD Electronically signed by Antony Contras MD on 06/26/2021 at 12:11:00 PM.    Final     Assessment/Plan: Post bleed day 7 post coil day 7 ruptured MCA aneurysm on the left neurologically stable aphasic and hemiparetic.  Continue supportive care vasospasm management and therapies  LOS: 7 days     Elaina Hoops 06/27/2021, 8:10 AM

## 2021-06-27 NOTE — Progress Notes (Signed)
NAME:  Yolanda Boone, MRN:  [id number], DOB:  08-Nov-1960, LOS: 7 ADMISSION DATE:  06/20/2021, CONSULTATION DATE:  5/28 REFERRING MD:  Donavan Burnet FOR CONSULT:  Vent and medical management   History of Present Illness:  Patient is encephalopathic and/or intubated. Therefore history has been obtained from chart review.   Yolanda Boone, is a 61 y.o. female, who presented to the [facility] ED with a chief complaint of being found unresponsive.  Last known normal 1615. Per report was active earlier in day. Found down later in evening. EMS called. Patient hypoxic with sats of 60% during transport.  ED course was notable for left gaze preference and posturing on arrival. Neuro was consulted. They were intubated in the ED. [facility] concerning for large volume subarachnoid hemorrhage, greatest in the left sylvian fissure, mass effect with 4 mm rightward shift.  PCCM was consulted for assistance with ventilator and medical management  Pertinent  Medical History  HTN, Thoracic aneurysm, gastric bypass, history of Graves disease.  Significant Hospital Events: Including procedures, antibiotic start and stop dates in addition to other pertinent events   5/28 present to [facility], PCCM consult, ETT 5/28 Hunt Hess 4 SAH from L MCA aneurysm 5/28 Coil embolization  5/31 extubated 6/3 concern for possible repeat aspiration 6/2 prompting n.p.o. status and holding tube feeds overnight.  Did spike temperature of 101.7 once but pulmonary exam is benign  Interim History / Subjective:  No issues overnight, she remains globally aphasic  Objective   Blood pressure 121/81, pulse 84, temperature 98.9 F (37.2 C), temperature source Oral, resp. rate (!) 23, height 5\' 7"  (1.702 m), weight 112.5 kg, SpO2 99 %.        Intake/Output Summary (Last 24 hours) at 06/27/2021 08/27/2021 Last data filed at 06/27/2021 0700 Gross per 24 hour  Intake 1327.5 ml  Output 1250 ml  Net 77.5 ml    Filed Weights   06/20/21 1800 06/20/21 2200  06/24/21 0600  Weight: 95 kg 114.4 kg 112.5 kg    Examination: General: Acute on chronically ill appearing elderly female lying in bed, in NAD HEENT: [place]/AT, MM pink/moist, PERRL,  Neuro: Will spontaneously open eyes and move upper extremities but unable to follow commands  CV: s1s2 regular rate and rhythm, no murmur, rubs, or gallops,  PULM:  Faint rhonchi ascultation in upper lobes, no increased work of breathing, on 2L [place] GI: soft, bowel sounds active in all 4 quadrants, non-tender, non-distended, tolerating TF Extremities: warm/dry, mp edema  Skin: no rashes or lesions  Resolved problems:  Aspiration pneumonitis  Complete 5 days of antibiotics Acute respiratory failure (HCC) Patient was extubated on 5/3  Assessment & Plan:   H/H4, MF 3 subarachnoid hemorrhage  -Due to ruptured left middle cerebral artery aneurysm, s/p coil embolization Compression of brain due to nontraumatic subarachnoid hemorrhage  Acute left MCA territory stroke -Now post bleed day 5 -No vasospasm on angiogram -Echo normal 5/30  P: Primary management per NSGY  Maintain neuro protective measures Nutrition and bowel regiment  Seizure precautions  AEDs per neurology  Aspirations precautions  Continue Nimotop  Acute respiratory failure  -Patient was extubated on 5/3 Aspiration pneumonitis  -Complete 5 days of antibiotics -Plan for possible repeat aspiration 6/2 on dysphagia diet.  Patient placed n.p.o. apart from tube feeds P: Continue supplemental oxygen wean as able  SPO2 goal > 92 Elevated HOB  Monitor closely for signs of aspiration Reassess by SLP prior to resumption of oral diet  Dysphagia related to subarachnoid  hemorrhage and left MCA stroke P: Continue TF via Cortrack  Hx of aortic aneurysm -No sign of rupture, no pulse deficits. P: Supportive care   Best Practice (right click and "Reselect all SmartList Selections" daily)   Diet/type: tubefeeds and dysphagia diet (see  orders) DVT prophylaxis: prophylactic heparin  GI prophylaxis: PPI Lines: N/A Foley:  removal ordered  Code Status:  full code Last date of multidisciplinary goals of care discussion [Per primary team]  Signature:  Deneshia Zucker D. Tiburcio Pea, NP-C [facility] Personal contact information can be found on Amion  06/27/2021, 7:12 AM

## 2021-06-28 ENCOUNTER — Inpatient Hospital Stay (HOSPITAL_COMMUNITY): Payer: BC Managed Care – PPO

## 2021-06-28 ENCOUNTER — Telehealth: Payer: Self-pay | Admitting: Internal Medicine

## 2021-06-28 DIAGNOSIS — E876 Hypokalemia: Secondary | ICD-10-CM | POA: Diagnosis not present

## 2021-06-28 DIAGNOSIS — I6012 Nontraumatic subarachnoid hemorrhage from left middle cerebral artery: Secondary | ICD-10-CM | POA: Diagnosis not present

## 2021-06-28 LAB — CBC
HCT: 36.2 % (ref 36.0–46.0)
Hemoglobin: 11.6 g/dL — ABNORMAL LOW (ref 12.0–15.0)
MCH: 28.9 pg (ref 26.0–34.0)
MCHC: 32 g/dL (ref 30.0–36.0)
MCV: 90.3 fL (ref 80.0–100.0)
Platelets: 236 10*3/uL (ref 150–400)
RBC: 4.01 MIL/uL (ref 3.87–5.11)
RDW: 12.9 % (ref 11.5–15.5)
WBC: 14.5 10*3/uL — ABNORMAL HIGH (ref 4.0–10.5)
nRBC: 0 % (ref 0.0–0.2)

## 2021-06-28 LAB — GLUCOSE, CAPILLARY
Glucose-Capillary: 129 mg/dL — ABNORMAL HIGH (ref 70–99)
Glucose-Capillary: 126 mg/dL — ABNORMAL HIGH (ref 70–99)
Glucose-Capillary: 108 mg/dL — ABNORMAL HIGH (ref 70–99)
Glucose-Capillary: 137 mg/dL — ABNORMAL HIGH (ref 70–99)
Glucose-Capillary: 114 mg/dL — ABNORMAL HIGH (ref 70–99)
Glucose-Capillary: 132 mg/dL — ABNORMAL HIGH (ref 70–99)

## 2021-06-28 LAB — BASIC METABOLIC PANEL
Anion gap: 5 (ref 5–15)
BUN: 22 mg/dL — ABNORMAL HIGH (ref 6–20)
CO2: 27 mmol/L (ref 22–32)
Calcium: 8.8 mg/dL — ABNORMAL LOW (ref 8.9–10.3)
Chloride: 108 mmol/L (ref 98–111)
Creatinine, Ser: 0.63 mg/dL (ref 0.44–1.00)
GFR, Estimated: >60 mL/min (ref >60)
Glucose, Bld: 155 mg/dL — ABNORMAL HIGH (ref 70–99)
Potassium: 4 mmol/L (ref 3.5–5.1)
Sodium: 140 mmol/L (ref 135–145)

## 2021-06-28 MED ORDER — SODIUM CHLORIDE 0.9 % IV SOLN: INTRAVENOUS | Status: DC

## 2021-06-28 MED ORDER — HEPARIN SODIUM (PORCINE) 5000 UNIT/ML IJ SOLN
5000.0000 [IU] | Freq: Three times a day (TID) | INTRAMUSCULAR | Status: DC
  Administered 2021-06-28 – 2021-07-03 (×15): 5000 [IU] via SUBCUTANEOUS
  Filled 2021-06-28 (×14): qty 1

## 2021-06-28 MED ORDER — LEVETIRACETAM 100 MG/ML PO SOLN
500.0000 mg | Freq: Two times a day (BID) | ORAL | Status: DC
Start: 2021-06-28 — End: 2021-07-04
  Administered 2021-06-28 – 2021-07-04 (×12): 500 mg
  Filled 2021-06-28 (×12): qty 5

## 2021-06-28 MED ORDER — NUTRISOURCE FIBER PO PACK
1.0000 | PACK | Freq: Two times a day (BID) | ORAL | Status: DC
Start: 2021-06-28 — End: 2021-07-02
  Administered 2021-06-28 – 2021-07-02 (×9): 1
  Filled 2021-06-28 (×8): qty 1

## 2021-06-28 NOTE — Progress Notes (Signed)
  NEUROSURGERY PROGRESS NOTE   Pt seen and examined. No issues overnight.   EXAM: Temp:  [99.1 F (37.3 C)-101.2 F (38.4 C)] 99.1 F (37.3 C) (06/05 0800) Pulse Rate:  [80-110] 92 (06/05 1100) Resp:  [20-28] 22 (06/05 1100) BP: (122-176)/(72-93) 147/86 (06/05 1100) SpO2:  [92 %-98 %] 94 % (06/05 1100) Intake/Output      06/04 0701 06/05 0700 06/05 0701 06/06 0700   NG/GT 1150 114.2   IV Piggyback 200 100   Total Intake(mL/kg) 1350 (12) 214.2 (1.9)   Urine (mL/kg/hr) 1550 (0.6)    Stool 300    Total Output 1850    Net -500 +214.2         Awake, alert Non-verbal Face symmetric Not-following commands Moves LUE/LLE spontaneously, 1/5 spontaneous proximal movement RUE (primarily shoulder abduction and rotation). Minimal spontaneous movement RLE  LABS: Lab Results  Component Value Date   CREATININE 0.63 06/28/2021   BUN 22 (H) 06/28/2021   NA 140 06/28/2021   K 4.0 06/28/2021   CL 108 06/28/2021   CO2 27 06/28/2021   Lab Results  Component Value Date   WBC 14.5 (H) 06/28/2021   HGB 11.6 (L) 06/28/2021   HCT 36.2 06/28/2021   MCV 90.3 06/28/2021   PLT 236 06/28/2021      IMPRESSION: - 61 y.o. female [place] d# 8 s/p coiling ruptured RMCA aneurysm, neurologically stable with LMCA terrirtory stroke and associated dense right hemiplegia and mixed aphasia  PLAN: - Cont supportive care, vasospasm monitoring - Cont PT/OT - Cont Nimotop - If remains stable without clinical signs of spasm, could consider d/c to rehab later this week.    Consuella Lose, MD [facility]

## 2021-06-28 NOTE — Telephone Encounter (Addendum)
I have filled in as much of the FMLA form as I am able and given the form to Yolanda Boone to [facility] to Dr. Lynetta Mare for completion.  Patient is still in the hospital.

## 2021-06-28 NOTE — Progress Notes (Signed)
Transcranial Doppler   Date POD PCO2 HCT BP   MCA ACA PCA OPHT SIPH VERT Basilar  5/29 RH         Right  Left   *  *   *  *   *  *   9  11   48  40   *  *   *       5/30 [facility]          Right  Left    *   *    *   *    *   *    14   14    *   10    -32   -49    -46       6/2 RH          Right  Left    *   *    *   *    *   *    22   17    *   46    -16   -26    -22        6/5 RH          Right  Left    70   *       *    27   *    22   14    72   38    -28   -29    -45                   Right  Left                                                               Right  Left                                                               Right  Left                                                       MCA = Middle Cerebral Artery      OPHT = Opthalmic Artery     BASILAR = Basilar Artery   ACA = Anterior Cerebral Artery     SIPH = Carotid Siphon PCA = Posterior Cerebral Artery   VERT = Verterbral Artery                    Normal MCA = 62+\-12 ACA = 50+\-12 PCA = 42+\-23    *= Unable to insonate  [place]= 2.69, unable to calculate left due to inability to insonate left temporal window.   Jean Rosenthal, RDMS, RVT

## 2021-06-28 NOTE — Progress Notes (Signed)
NAME:  Yolanda Boone, MRN:  [id number], DOB:  09-01-1960, LOS: 8 ADMISSION DATE:  06/20/2021, CONSULTATION DATE:  5/28 REFERRING MD:  Donavan Burnet FOR CONSULT:  Vent and medical management   History of Present Illness:  Patient is encephalopathic and/or intubated. Therefore history has been obtained from chart review.   Yolanda Boone, is a 61 y.o. female, who presented to the [facility] ED with a chief complaint of being found unresponsive.  Last known normal 1615. Per report was active earlier in day. Found down later in evening. EMS called. Patient hypoxic with sats of 60% during transport.  ED course was notable for left gaze preference and posturing on arrival. Neuro was consulted. They were intubated in the ED. [facility] concerning for large volume subarachnoid hemorrhage, greatest in the left sylvian fissure, mass effect with 4 mm rightward shift.  PCCM was consulted for assistance with ventilator and medical management  Pertinent  Medical History  HTN, Thoracic aneurysm, gastric bypass, history of Graves disease.  Significant Hospital Events: Including procedures, antibiotic start and stop dates in addition to other pertinent events   5/28 present to [facility], PCCM consult, ETT 5/28 Hunt Hess 4 SAH from L MCA aneurysm 5/28 Coil embolization  5/31 extubated 6/3 concern for possible repeat aspiration 6/2 prompting n.p.o. status and holding tube feeds overnight.  Did spike temperature of 101.7 once but pulmonary exam is benign  Interim History / Subjective:   No overnight events. Febrile throughout the day yesterday. Persistently aphasic. Not following commands this AM.  Alert and comfortable though.   Objective   Blood pressure (!) 147/86, pulse 92, temperature 99.1 F (37.3 C), temperature source Axillary, resp. rate (!) 22, height 5\' 7"  (1.702 m), weight 112.5 kg, SpO2 94 %.        Intake/Output Summary (Last 24 hours) at 06/28/2021 1227 Last data filed at 06/28/2021 1200 Gross per 24  hour  Intake 1364.17 ml  Output 2450 ml  Net -1085.83 ml   Filed Weights   06/20/21 1800 06/20/21 2200 06/24/21 0600  Weight: 95 kg 114.4 kg 112.5 kg   Examination:  General: Acute on chronically ill appearing elderly female lying in bed, in NAD HEENT: [place]/AT, MM pink/moist, PERRL,  Neuro: Alert. Aphasic and not following commands. On re-examination, she is moving LUE with ease. No movement of the RUE or BLE.  CV: s1s2 regular rate and rhythm, no murmur, rubs, or gallops,  PULM:  Clear to auscultation in all lung fields.  GI: soft, bowel sounds active in all 4 quadrants, non-tender, non-distended, tolerating TF Extremities: warm/dry, mp edema  Skin: no rashes or lesions  Resolved problems:  Aspiration pneumonitis  Complete 5 days of antibiotics Acute respiratory failure (HCC) Patient was extubated on 5/3  Assessment & Plan:   H/H4, MF 3 subarachnoid hemorrhage  - Due to ruptured left middle cerebral artery aneurysm, s/p coil embolization Compression of brain due to nontraumatic subarachnoid hemorrhage  Acute left MCA territory stroke -Now post bleed day 7 -No vasospasm on angiogram with stable neurological examination.  -Echo normal 5/30  P: Maintain neuro protective measures Monitor closely for any change in neurological function. High risk for vasospasm over the next 72 hours.  Continue Nimotop with plan for 21 day course  Nutrition and bowel regiment  Seizure precautions  Continue prophylactic Keppra Aspirations precautions   Acute respiratory failure  -Patient was extubated on 6/3 Aspiration pneumonitis  -Completed 5 days of antibiotics from 5/28-6/02 -Possible repeat aspiration event 6/2 on dysphagia diet.  Patient placed n.p.o. apart from tube feeds P: Continue supplemental oxygen wean as able  SPO2 goal > 92 Elevated HOB  Monitor closely for signs of aspiration Reassess by SLP prior to resumption of oral diet  Dysphagia related to subarachnoid hemorrhage  and left MCA stroke P: - Continue TF via Cortrack  Hx of aortic aneurysm -No sign of rupture, no pulse deficits. P: Supportive care   Best Practice (right click and "Reselect all SmartList Selections" daily)   Diet/type: tubefeeds and dysphagia diet (see orders) DVT prophylaxis: prophylactic heparin  GI prophylaxis: N/A Lines: N/A Foley:  Removed.  Code Status:  full code Last date of multidisciplinary goals of care discussion [Per primary team]  Signature:   Dr. Verdene Lennert Internal Medicine PGY-3  06/28/2021, 12:27 PM

## 2021-06-28 NOTE — Progress Notes (Signed)
Occupational Therapy Treatment Patient Details Name: Yolanda Boone MRN: [id number] DOB: 1961/01/04 Today's Date: 06/28/2021   History of present illness 61 y.o. female [facility]21 after being found down by EMS. Pt with L gaze and posturing, hypoxic to 60s requiring emergent intubation 06/20/21-06/23/21 . CTH and CTA reveal large SAH with midline shift, concerning for L MCA aneurysm rupture. 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 No PMH on file.   OT comments  Rosey Batheresa was not noted to make functional progress towards her acute OT goals this date, she was limited by lethargy and LOA. Overall pt required max/total A for all rolling, grooming and bed level exercise this date. Hand over L hand provided for functional grooming and to assist in rolling, and PROM of LUE completed in supine and in sidelying with good tolerance, see details below. Pt will continue to benefit from OT acutely to progress OOB functional activity as appropriate. D/c remains appropriate in anticipation pt will progress to tolerate intensity.    Recommendations for follow up therapy are one component of a multi-disciplinary discharge planning process, led by the attending physician.  Recommendations may be updated based on patient status, additional functional criteria and insurance authorization.    Follow Up Recommendations  Acute inpatient rehab (3hours/day)    Assistance Recommended at Discharge Frequent or constant Supervision/Assistance  Patient can return home with the following  Two people to help with walking and/or transfers;Two people to help with bathing/dressing/bathroom;Assistance with cooking/housework;Assistance with feeding;Direct supervision/assist for medications management;Direct supervision/assist for financial management;Assist for transportation;Help with stairs or ramp for entrance   Equipment  Recommendations  Other (comment)    Recommendations for Other Services Other (comment)    Precautions / Restrictions Precautions Precautions: Fall Restrictions Weight Bearing Restrictions: No       Mobility Bed Mobility Overal bed mobility: Needs Assistance Bed Mobility: Rolling Rolling: Max assist         General bed mobility comments: rolling to L and R to increase LOA and assist in positioning for PROM exercises. deferred EOB due to lethargy    Transfers                         Balance Overall balance assessment: Needs assistance                                         ADL either performed or assessed with clinical judgement   ADL Overall ADL's : Needs assistance/impaired     Grooming: Wash/dry face;Maximal assistance;Bed level Grooming Details (indicate cue type and reason): hand over hand with LUE                             Functional mobility during ADLs: Maximal assistance (bed level) General ADL Comments: bed level session due to lethargy, minimal pursposeful movements noted. AAROM/PROM at bed level    Extremity/Trunk Assessment Upper Extremity Assessment Upper Extremity Assessment: RUE deficits/detail RUE Deficits / Details: no active movement noted today. PROM remains WFL RUE Coordination: decreased gross motor;decreased fine motor LUE Deficits / Details: spontaneous movement, able to bring to mouth, still attempting to pull lines - in mitt at end of session   Lower Extremity Assessment Lower Extremity Assessment: Defer to PT  evaluation        Vision   Vision Assessment?: Vision impaired- to be further tested in functional context   Perception Perception Perception: Not tested   Praxis Praxis Praxis: Not tested    Cognition Arousal/Alertness: Lethargic Behavior During Therapy: Flat affect Overall Cognitive Status: Difficult to assess                                 General Comments:  eyes closed 50% of the session, following <10% of simple commands with significantly increased time        Exercises Exercises: General Upper Extremity General Exercises - Upper Extremity Shoulder Flexion: PROM, Right, 5 reps Shoulder Horizontal ABduction: PROM, Left, 5 reps Elbow Flexion: PROM, Left, 10 reps, Supine Elbow Extension: PROM, Left, 10 reps, Supine Wrist Flexion: PROM, Left, 10 reps, Supine Wrist Extension: PROM, Left, 10 reps, Supine Other Exercises Other Exercises: cervical rotation to the L Other Exercises: L shoulder external/internal rotation Other Exercises: PROM scapular elevation, protration, retraction in sidelying    Shoulder Instructions       General Comments VSS    Pertinent Vitals/ Pain       Pain Assessment Pain Assessment: Faces Faces Pain Scale: Hurts a little bit Pain Location: generalized with BUE and cervical ROM Pain Descriptors / Indicators: Grimacing Pain Intervention(s): Monitored during session  Home Living                                          Prior Functioning/Environment              Frequency  Min 2X/week        Progress Toward Goals  OT Goals(current goals can now be found in the care plan section)  Progress towards OT goals: Not progressing toward goals - comment (no functional progressed noted)  Acute Rehab OT Goals Patient Stated Goal: unable to state OT Goal Formulation: With family Time For Goal Achievement: 07/07/21 Potential to Achieve Goals: Good ADL Goals Pt Will Perform Grooming: with max assist;sitting Additional ADL Goal #1: Pt will purposefully grasp an ADL object with LUE 50% of the time Additional ADL Goal #2: Pt will follow one step commands 50% of the time Additional ADL Goal #3: Pt will perform bed mobility at mod A +2 to come EOB in preparation for participation in ADL  Plan Discharge plan remains appropriate;Frequency remains appropriate    Co-evaluation                  AM-PAC OT "6 Clicks" Daily Activity     Outcome Measure   Help from another person eating meals?: Total Help from another person taking care of personal grooming?: Total Help from another person toileting, which includes using toliet, bedpan, or urinal?: Total Help from another person bathing (including washing, rinsing, drying)?: Total Help from another person to put on and taking off regular upper body clothing?: Total Help from another person to put on and taking off regular lower body clothing?: Total 6 Click Score: 6    End of Session    OT Visit Diagnosis: Other symptoms and signs involving cognitive function;Other symptoms and signs involving the nervous system (R29.898)   Activity Tolerance     Patient Left in bed;with call bell/phone within reach   Nurse Communication Mobility status  Time: 0865-7846 OT Time Calculation (min): 18 min  Charges: OT General Charges $OT Visit: 1 Visit OT Treatments $Therapeutic Exercise: 8-22 mins   Claudis Giovanelli A Ramses Klecka 06/28/2021, 4:39 PM

## 2021-06-28 NOTE — Progress Notes (Signed)
Speech Language Pathology Treatment: Cognitive-Linquistic;Dysphagia  Patient Details Name: Yolanda Boone MRN: [id number] DOB: 08-22-1960 Today's Date: 06/28/2021 Time: 1130-1200 SLP Time Calculation (min) (ACUTE ONLY): 30 min  Assessment / Plan / Recommendation Clinical Impression  Pt showing some progress in auditory comprehension and verbal expression. Pt not following commands, very distracted by her left hand and things in her visual field. Daughter doing a good job of reducing distractions. When pt is attentive or motivated she was able to respond with a head nod/shake and 'mmhmm'/'uhhuhh' for yes no basic biographical question and wants and needs. Pt responded correctly in 5/7 trails. She remains attentive to water but does demonstrate multiple swallows. MBS tomorrow for instrumental assessment.   HPI HPI: Pt is a 61 y.o. female who presented to the ED on 06/20/21 after being found down by EMS. Left gaze, posturing, and hypoxia the 60's noted in the ED and pt intubated. ETT 5/28-5/31 CT head and CTA revealed large SAH with midline shift concerning for left MCA aneurysm rupture. 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 No PMH in chart.      SLP Plan  MBS      Recommendations for follow up therapy are one component of a multi-disciplinary discharge planning process, led by the attending physician.  Recommendations may be updated based on patient status, additional functional criteria and insurance authorization.    Recommendations  Diet recommendations: NPO                Follow Up Recommendations: Acute inpatient rehab (3hours/day) Plan: MBS           Kayse Puccini, Riley Nearing  06/28/2021, 1:38 PM

## 2021-06-28 NOTE — Progress Notes (Signed)
Physical Therapy Treatment Patient Details Name: Yolanda Boone MRN: [id number] DOB: 1960/04/30 Today's Date: 06/28/2021   History of Present Illness 61 y.o. female presents to [facility] hospital on 06/20/2021 after being found down by EMS. Pt with L gaze and posturing, hypoxic to 60s requiring emergent intubation 06/20/21-06/23/21 . CTH and CTA reveal large SAH with midline shift, concerning for L MCA aneurysm rupture. 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 No PMH on file.    PT Comments    Pt admitted with above diagnosis. Pt was able to participate in session sitting EOB today. Pt sat EOB 20 min with max assist initially progressing to min guard assist for up to 2 min with left UE support. Pt  was able to reach for Vasoline tube and then handed it back to therapist with commands. Pt with delayed response.  Pt did not follow other commands. Daughter very attentive and agree with AIR as daugher committed to take pt home at d/c.  Pt currently with functional limitations due to balance and endurance deficits.  Pt will benefit from skilled PT to increase their independence and safety with mobility to allow discharge to the venue listed below.      Recommendations for follow up therapy are one component of a multi-disciplinary discharge planning process, led by the attending physician.  Recommendations may be updated based on patient status, additional functional criteria and insurance authorization.  Follow Up Recommendations  Acute inpatient rehab (3hours/day)     Assistance Recommended at Discharge Frequent or constant Supervision/Assistance  Patient can return home with the following Two people to help with walking and/or transfers;Two people to help with bathing/dressing/bathroom;Assistance with cooking/housework;Assistance with feeding;Direct supervision/assist for medications management;Direct supervision/assist  for financial management;Assist for transportation;Help with stairs or ramp for entrance   Equipment Recommendations  Wheelchair (measurements PT);Wheelchair cushion (measurements PT);Hospital bed (hoyer lift)    Recommendations for Other Services       Precautions / Restrictions Precautions Precautions: Fall Restrictions Weight Bearing Restrictions: No     Mobility  Bed Mobility Overal bed mobility: Needs Assistance Bed Mobility: Supine to Sit, Sit to Supine     Supine to sit: Total assist, +2 for physical assistance, HOB elevated Sit to supine: Total assist, +2 for physical assistance, HOB elevated   General bed mobility comments: use of bed pad to facilitate, multimodal cues Pt continues to be total A +2 for all aspects of bed mobility    Transfers                        Ambulation/Gait                   Stairs             Wheelchair Mobility    Modified Rankin (Stroke Patients Only) Modified Rankin (Stroke Patients Only) Pre-Morbid Rankin Score: No symptoms Modified Rankin: Severe disability     Balance Overall balance assessment: Needs assistance Sitting-balance support: Single extremity supported, Feet supported Sitting balance-Leahy Scale: Poor Sitting balance - Comments: mod to max assit  initially wtaking about 4 min and then pt progressed to min guard assist for up to 2 min at a time with left UE support.   with fatigue pt begins to push posteriorly and to R side, requiring mod A to maintain upright Postural control: Posterior lean, Right lateral lean  Cognition Arousal/Alertness: Awake/alert Behavior During Therapy: Flat affect Overall Cognitive Status: Impaired/Different from baseline Area of Impairment: Following commands, Awareness, Problem solving                       Following Commands: Follows one step commands inconsistently, Follows one step commands with  increased time Safety/Judgement: Decreased awareness of deficits, Decreased awareness of safety Awareness: Intellectual Problem Solving: Slow processing, Decreased initiation General Comments: The only commands that pt followed was to reach for Vaseline and then to hand it back to therapist with delay(using L hand). She will attend to the left side 30% of the time. Pt did not attempt to vocalize throughout session but would raise eyebrows and shook head yes and no but inconsistent responses.   Multiple facial expressions - but not consistent and not attempting to communicate through expression at this time.  Pt would not do a thumbs up or down to command either        Exercises Other Exercises Other Exercises: PROM for BLE ankles, knees, hips    General Comments General comments (skin integrity, edema, etc.): VSS during session      Pertinent Vitals/Pain Pain Assessment Pain Assessment: No/denies pain    Home Living                          Prior Function            PT Goals (current goals can now be found in the care plan section) Acute Rehab PT Goals Patient Stated Goal: to return to independence Progress towards PT goals: Progressing toward goals    Frequency    Min 4X/week      PT Plan Current plan remains appropriate    Co-evaluation              AM-PAC PT "6 Clicks" Mobility   Outcome Measure  Help needed turning from your back to your side while in a flat bed without using bedrails?: Total Help needed moving from lying on your back to sitting on the side of a flat bed without using bedrails?: Total Help needed moving to and from a bed to a chair (including a wheelchair)?: Total Help needed standing up from a chair using your arms (e.g., wheelchair or bedside chair)?: Total Help needed to walk in hospital room?: Total Help needed climbing 3-5 steps with a railing? : Total 6 Click Score: 6    End of Session Equipment Utilized During  Treatment: Oxygen Activity Tolerance: Patient limited by fatigue Patient left: in bed;with call bell/phone within reach;with bed alarm set;with family/visitor present Nurse Communication: Mobility status;Need for lift equipment PT Visit Diagnosis: Other abnormalities of gait and mobility (R26.89);Muscle weakness (generalized) (M62.81);Hemiplegia and hemiparesis Hemiplegia - Right/Left: Right Hemiplegia - caused by: Nontraumatic SAH     Time: 1109-1140 PT Time Calculation (min) (ACUTE ONLY): 31 min  Charges:  $Neuromuscular Re-education: 23-37 mins                     Yolanda Boone,PT Acute Rehab Services [phone] (pager)    Yolanda Boone 06/28/2021, 3:23 PM

## 2021-06-29 ENCOUNTER — Inpatient Hospital Stay (HOSPITAL_COMMUNITY): Payer: BC Managed Care – PPO

## 2021-06-29 DIAGNOSIS — Z0289 Encounter for other administrative examinations: Secondary | ICD-10-CM

## 2021-06-29 LAB — GLUCOSE, CAPILLARY
Glucose-Capillary: 147 mg/dL — ABNORMAL HIGH (ref 70–99)
Glucose-Capillary: 103 mg/dL — ABNORMAL HIGH (ref 70–99)
Glucose-Capillary: 130 mg/dL — ABNORMAL HIGH (ref 70–99)
Glucose-Capillary: 112 mg/dL — ABNORMAL HIGH (ref 70–99)
Glucose-Capillary: 138 mg/dL — ABNORMAL HIGH (ref 70–99)
Glucose-Capillary: 138 mg/dL — ABNORMAL HIGH (ref 70–99)

## 2021-06-29 MED ORDER — OSMOLITE 1.5 CAL PO LIQD
1000.0000 mL | ORAL | Status: DC
  Administered 2021-06-29 – 2021-06-30 (×2): 1000 mL

## 2021-06-29 NOTE — Telephone Encounter (Signed)
Note created in error.

## 2021-06-29 NOTE — Telephone Encounter (Signed)
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 Naramore.  The form filled out by Dr. Denese Killings is correct, daughter will need to fill out the company FMLA form that goes to her HR Department.  I left her a voice message to come pick up the forms at the front desk.  $29 fee will need to be paid.

## 2021-06-29 NOTE — Progress Notes (Signed)
  NEUROSURGERY PROGRESS NOTE   Pt seen and examined. No issues overnight.   EXAM: Temp:  [99.2 F (37.3 C)-101 F (38.3 C)] 99.2 F (37.3 C) (06/06 1200) Pulse Rate:  [90-105] 99 (06/06 1400) Resp:  [18-27] 22 (06/06 1400) BP: (111-139)/(61-100) 131/88 (06/06 1400) SpO2:  [91 %-97 %] 97 % (06/06 1400) Intake/Output      06/05 0701 06/06 0700 06/06 0701 06/07 0700   I.V. (mL/kg) 1380.9 (12.3) 311.8 (2.8)   Other 30    NG/GT 364.2    IV Piggyback 100    Total Intake(mL/kg) 1875.1 (16.7) 311.8 (2.8)   Urine (mL/kg/hr) 1400 (0.5) 750 (0.9)   Stool 100    Total Output 1500 750   Net +375.1 -438.2         Awake, alert Non-verbal Face symmetric Not-following commands Moves LUE/LLE spontaneously, Minimal movement RUE/RLE  LABS: Lab Results  Component Value Date   CREATININE 0.63 06/28/2021   BUN 22 (H) 06/28/2021   NA 140 06/28/2021   K 4.0 06/28/2021   CL 108 06/28/2021   CO2 27 06/28/2021   Lab Results  Component Value Date   WBC 14.5 (H) 06/28/2021   HGB 11.6 (L) 06/28/2021   HCT 36.2 06/28/2021   MCV 90.3 06/28/2021   PLT 236 06/28/2021      IMPRESSION: - 62 y.o. 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    Lisbeth Renshaw, MD [facility]

## 2021-06-29 NOTE — Progress Notes (Signed)
Nutrition Follow-up  DOCUMENTATION CODES:   Obesity unspecified  INTERVENTION:    Change Vital 1.5 @ 50 ml/hr to Osmolite 1.5 @ 50 ml/hr (1200 ml/day) 45 ml ProSource TF BID  Monitor PO intake and adjust TF as appropriate.   Provides: 1880 kcal, 97 grams protein, and 912 ml free water.   MVI with minerals BID to meet bariatric guidelines Calcium citrate with vitamin D 500 mg TID to meet bariatric guidelines   NUTRITION DIAGNOSIS:   Inadequate oral intake related to dysphagia as evidenced by meal completion < 50%. Ongoing.   GOAL:   Patient will meet greater than or equal to 90% of their needs Not met.   MONITOR:   PO intake, Diet advancement, TF tolerance  REASON FOR ASSESSMENT:   Consult Enteral/tube feeding initiation and management  ASSESSMENT:   Pt with PMH of HTN, thoracic aneurysm, gastric bypass, and grave's dz who was admitted with [facility] s/p coiling of ruptured R MCA aneurysm.    Pt discussed during ICU rounds and with RN.  Spoke with daughter and pt. Daughter trying to feed pt lunch but she had only take a couple of bites. Per daughter pt would only open her mouth slightly now that her teeth are in.   5/30 s/p cortrak placement, per xray tip in gastric body, question if this is the gastric pouch nothing noted about gastric bypass in xray.  6/1 diet advanced to Dysphagia 1 with thin liquids; ate 100% of dinner 6/2 aspiration event, NPO and TF held 6/3 TF restarted 6/6 s/p MBS started on Dysphagia diet with nectar thickened liquids   Medications reviewed and include: oscal with D (500-5) TID, nutrisource with fiber, MVI with minerals, nimotop NS @ 50 ml/hr   Labs reviewed:  Vitamin D: 22.88 CBG: 108-147   Diet Order:   Diet Order             DIET - DYS 1 Room service appropriate? Yes; Fluid consistency: Nectar Thick  Diet effective now                   EDUCATION NEEDS:   No education needs have been identified at this time  Skin:   Skin Assessment: Reviewed RN Assessment  Last BM:  100 ml via rectal tube  Height:   Ht Readings from Last 1 Encounters:  06/20/21 $RemoveB'5\' 7"'nBtOjfnw$  (1.702 m)    Weight:   Wt Readings from Last 1 Encounters:  06/24/21 112.5 kg    BMI:  Body mass index is 38.85 kg/m.  Estimated Nutritional Needs:   Kcal:  1800-2000  Protein:  95-120 grams  Fluid:  >1.8 L/day  [name]., RD, LDN, CNSC See AMiON for contact information

## 2021-06-29 NOTE — Progress Notes (Signed)
NAME:  Yolanda Boone, MRN:  [id number], DOB:  1960/03/18, LOS: 9 ADMISSION DATE:  06/20/2021, CONSULTATION DATE:  5/28 REFERRING MD:  Donavan Burnet FOR CONSULT:  Vent and medical management   History of Present Illness:  Patient is encephalopathic and/or intubated. Therefore history has been obtained from chart review.   Yolanda Boone, is a 61 y.o. female, who presented to the [facility] ED with a chief complaint of being found unresponsive.  Last known normal 1615. Per report was active earlier in day. Found down later in evening. EMS called. Patient hypoxic with sats of 60% during transport.  ED course was notable for left gaze preference and posturing on arrival. Neuro was consulted. They were intubated in the ED. [facility] concerning for large volume subarachnoid hemorrhage, greatest in the left sylvian fissure, mass effect with 4 mm rightward shift.  PCCM was consulted for assistance with ventilator and medical management  Pertinent  Medical History  HTN, Thoracic aneurysm, gastric bypass, history of Graves disease.  Significant Hospital Events: Including procedures, antibiotic start and stop dates in addition to other pertinent events   5/28 present to [facility], PCCM consult, ETT 5/28 [facility] 4 SAH from L MCA aneurysm 5/28 Coil embolization  5/31 extubated 6/3 concern for possible repeat aspiration 6/2 prompting n.p.o. status and holding tube feeds overnight.  Did spike temperature of 101.7 once but pulmonary exam is benign  Interim History / Subjective:   No overnight events.  One febrile event yesterday only.  Alert and awake this morning.  Objective   Blood pressure 117/68, pulse (!) 101, temperature 99.6 F (37.6 C), temperature source Axillary, resp. rate (!) 26, height 5\' 7"  (1.702 m), weight 112.5 kg, SpO2 94 %.        Intake/Output Summary (Last 24 hours) at 06/29/2021 0855 Last data filed at 06/29/2021 0800 Gross per 24 hour  Intake 1895.03 ml  Output 1500 ml  Net 395.03 ml     Filed Weights   06/20/21 1800 06/20/21 2200 06/24/21 0600  Weight: 95 kg 114.4 kg 112.5 kg   Examination:  General: Acute on chronically ill appearing elderly female lying in bed, in NAD HEENT: [place]/AT, MM pink/moist, PERRL,  Neuro: Alert and tracks appropriately at times. Intermittently follows commands when her daughter in the room asks. Spontaneously moves RUE against gravity. No movement of the LUE or BLE.  CV: s1s2 regular rate and rhythm, no murmur, rubs, or gallops,  PULM:  Clear to auscultation in all lung fields.  GI: soft, bowel sounds active in all 4 quadrants, non-tender, non-distended, tolerating TF Extremities: warm/dry, mp edema  Skin: no rashes or lesions  Resolved problems:  Aspiration pneumonitis  Complete 5 days of antibiotics Acute respiratory failure (HCC) Patient was extubated on 5/3  Assessment & Plan:   H/H4, MF 3 subarachnoid hemorrhage  - Due to ruptured left middle cerebral artery aneurysm, s/p coil embolization Compression of brain due to nontraumatic subarachnoid hemorrhage  Acute left MCA territory stroke -Now post bleed day 8 -No vasospasm on angiogram with improving neurological examination given following commands at this time.  -Echo normal 5/30  P: Maintain neuro protective measures Monitor closely for any change in neurological function. High risk for vasospasm over the next 72 hours.  Continue Nimotop with plan for 21 day course  Nutrition and bowel regiment  Seizure precautions  Continue prophylactic Keppra Aspirations precautions   Acute respiratory failure  -Patient was extubated on 6/3 Aspiration pneumonitis  -Completed 5 days of antibiotics from 5/28-6/02 -  Possible repeat aspiration event 6/2 on dysphagia diet.  Patient placed n.p.o. apart from tube feeds P: Elevated HOB  Monitor closely for signs of aspiration Reassess by SLP prior to resumption of oral diet  Dysphagia related to subarachnoid hemorrhage and left MCA  stroke P: Continue TF via Cortrack  Hx of aortic aneurysm -No sign of rupture, no pulse deficits. P: Supportive care   Best Practice (right click and "Reselect all SmartList Selections" daily)   Diet/type: tubefeeds and dysphagia diet (see orders) DVT prophylaxis: prophylactic heparin  GI prophylaxis: N/A Lines: N/A Foley:  Removed.  Code Status:  full code Last date of multidisciplinary goals of care discussion [Per primary team]  Signature:   Dr. Verdene Lennert Internal Medicine PGY-3  06/29/2021, 8:55 AM

## 2021-06-29 NOTE — Progress Notes (Signed)
Modified Barium Swallow Progress Note  Patient Details  Name: Yolanda Boone MRN: [id number] Date of Birth: [birth year]  Today's Date: 06/29/2021  Modified Barium Swallow completed.  Full report located under Chart Review in the Imaging Section.  Brief recommendations include the following:  Clinical Impression  Pt demonstrates a moderate oral dysphagia. Pt does not follow verbal commands and has a more automatic response to PO when self feeding with assist. She is able to sip from straw, but piecemeals boluses with premature spillage before and in between sips. There are instances of trace silent aspiration with ejection and also sensation but no ejection. Problem resolved with nectar thick liquids. Could not attempt strategies given pt could not follow commands. Pt tried to self feed solids, but did not initiate mastication and bolus was removed. Pt appropriate for puree and nectar thick liquids at this time. Will f/u for tolerance.   Swallow Evaluation Recommendations       SLP Diet Recommendations: Dysphagia 1 (Puree) solids;Nectar thick liquid   Liquid Administration via: Straw   Medication Administration: Crushed with puree   Supervision: Staff to assist with self feeding   Compensations: Slow rate;Small sips/bites             Harlon Ditty, MA CCC-SLP  Acute Rehabilitation Services Secure Chat Preferred Office [phone]    Claudine Mouton 06/29/2021,10:26 AM

## 2021-06-29 NOTE — Progress Notes (Signed)
Physical Therapy Treatment Patient Details Name: Yolanda Boone MRN: [id number] DOB: 1960-05-24 Today's Date: 06/29/2021   History of Present Illness 61 y.o. female presents to [facility] hospital on 06/20/2021 after being found down by EMS. Pt with L gaze and posturing, hypoxic to 60s requiring emergent intubation 06/20/21-06/23/21 . CTH and CTA reveal large SAH with midline shift, concerning for L MCA aneurysm rupture. 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 No PMH on file.    PT Comments    Pt awake after barium swallow study, kept eyes open throughout session today. Did not attempt to verbalize but did shake head at times as well as making some facial expressions. Pt needed tot A to come to sitting and return to supine. Worked on achieving and maintaining midline in sitting and using LUE functionally in sitting instead of bracing with it which is what she prefers to do. PT will continue to follow.    Recommendations for follow up therapy are one component of a multi-disciplinary discharge planning process, led by the attending physician.  Recommendations may be updated based on patient status, additional functional criteria and insurance authorization.  Follow Up Recommendations  Acute inpatient rehab (3hours/day)     Assistance Recommended at Discharge Frequent or constant Supervision/Assistance  Patient can return home with the following Two people to help with walking and/or transfers;Two people to help with bathing/dressing/bathroom;Assistance with cooking/housework;Assistance with feeding;Direct supervision/assist for medications management;Direct supervision/assist for financial management;Assist for transportation;Help with stairs or ramp for entrance   Equipment Recommendations  Wheelchair (measurements PT);Wheelchair cushion (measurements PT);Hospital bed (hoyer lift)    Recommendations for Other  Services       Precautions / Restrictions Precautions Precautions: Fall Restrictions Weight Bearing Restrictions: No     Mobility  Bed Mobility Overal bed mobility: Needs Assistance Bed Mobility: Supine to Sit, Sit to Supine     Supine to sit: Total assist, +2 for physical assistance, HOB elevated Sit to supine: Total assist, +2 for physical assistance, HOB elevated   General bed mobility comments: went to R side of bed today for deep pressure to R side, pt initiated SL to sit with L UE but immediately became insecure as she rose and began bracing with LUE    Transfers                   General transfer comment: unable    Ambulation/Gait               General Gait Details: unable   Stairs             Wheelchair Mobility    Modified Rankin (Stroke Patients Only) Modified Rankin (Stroke Patients Only) Pre-Morbid Rankin Score: No symptoms Modified Rankin: Severe disability     Balance Overall balance assessment: Needs assistance Sitting-balance support: Single extremity supported, Feet supported Sitting balance-Leahy Scale: Zero Sitting balance - Comments: pt with heavy R lean most of the time, bracing with LUE. When able to engage LUE she was able to sit with min A for short bouts. Pt attempting to reach for washcloth and daughter's hand with RUE, able to do this with support given to RUE. Worked on rocking side to side to increase comfort with midline, pt tolerated this fairly well Postural control: Posterior lean, Right lateral lean  Cognition Arousal/Alertness: Awake/alert Behavior During Therapy: Flat affect Overall Cognitive Status: Difficult to assess Area of Impairment: Following commands, Awareness, Problem solving                       Following Commands: Follows one step commands inconsistently, Follows one step commands with increased time Safety/Judgement: Decreased  awareness of deficits, Decreased awareness of safety Awareness: Intellectual Problem Solving: Slow processing, Decreased initiation General Comments: kept eyes open throughout session today, made attempts to follow simple, one step UE commands. Did not move LE's on command        Exercises      General Comments General comments (skin integrity, edema, etc.): VSS      Pertinent Vitals/Pain Pain Assessment Pain Assessment: Faces Faces Pain Scale: No hurt    Home Living                          Prior Function            PT Goals (current goals can now be found in the care plan section) Acute Rehab PT Goals Patient Stated Goal: to return to independence PT Goal Formulation: With family Time For Goal Achievement: 07/07/21 Potential to Achieve Goals: Poor Progress towards PT goals: Progressing toward goals    Frequency    Min 4X/week      PT Plan Current plan remains appropriate    Co-evaluation              AM-PAC PT "6 Clicks" Mobility   Outcome Measure  Help needed turning from your back to your side while in a flat bed without using bedrails?: Total Help needed moving from lying on your back to sitting on the side of a flat bed without using bedrails?: Total Help needed moving to and from a bed to a chair (including a wheelchair)?: Total Help needed standing up from a chair using your arms (e.g., wheelchair or bedside chair)?: Total Help needed to walk in hospital room?: Total Help needed climbing 3-5 steps with a railing? : Total 6 Click Score: 6    End of Session   Activity Tolerance: Patient limited by fatigue Patient left: in bed;with call bell/phone within reach;with bed alarm set;with family/visitor present Nurse Communication: Mobility status;Need for lift equipment PT Visit Diagnosis: Other abnormalities of gait and mobility (R26.89);Muscle weakness (generalized) (M62.81);Hemiplegia and hemiparesis Hemiplegia - Right/Left:  Right Hemiplegia - caused by: Nontraumatic SAH     Time: 2025-4270 PT Time Calculation (min) (ACUTE ONLY): 27 min  Charges:  $Therapeutic Activity: 8-22 mins $Neuromuscular Re-education: 8-22 mins                     Lyanne Co, PT  Acute Rehab Services  Pager [phone] Office [phone]    Lawana Chambers Javian Nudd 06/29/2021, 11:14 AM

## 2021-06-30 ENCOUNTER — Inpatient Hospital Stay (HOSPITAL_COMMUNITY): Payer: BC Managed Care – PPO

## 2021-06-30 DIAGNOSIS — J9601 Acute respiratory failure with hypoxia: Secondary | ICD-10-CM | POA: Diagnosis not present

## 2021-06-30 DIAGNOSIS — E876 Hypokalemia: Secondary | ICD-10-CM

## 2021-06-30 LAB — GLUCOSE, CAPILLARY
Glucose-Capillary: 116 mg/dL — ABNORMAL HIGH (ref 70–99)
Glucose-Capillary: 115 mg/dL — ABNORMAL HIGH (ref 70–99)
Glucose-Capillary: 99 mg/dL (ref 70–99)
Glucose-Capillary: 189 mg/dL — ABNORMAL HIGH (ref 70–99)
Glucose-Capillary: 134 mg/dL — ABNORMAL HIGH (ref 70–99)
Glucose-Capillary: 123 mg/dL — ABNORMAL HIGH (ref 70–99)

## 2021-06-30 MED ORDER — ENSURE ENLIVE PO LIQD
237.0000 mL | Freq: Two times a day (BID) | ORAL | Status: DC
Start: 2021-07-01 — End: 2021-07-16
  Administered 2021-07-01 – 2021-07-16 (×18): 237 mL via ORAL

## 2021-06-30 MED ORDER — ACETAMINOPHEN 650 MG RE SUPP: 650.0000 mg | RECTAL | Status: DC | PRN

## 2021-06-30 MED ORDER — ACETAMINOPHEN 325 MG PO TABS
650.0000 mg | ORAL_TABLET | ORAL | Status: DC | PRN
  Administered 2021-07-01 – 2021-07-05 (×8): 650 mg
  Filled 2021-06-30 (×8): qty 2

## 2021-06-30 MED ORDER — OSMOLITE 1.5 CAL PO LIQD
600.0000 mL | ORAL | Status: DC
  Administered 2021-07-01: 600 mL
  Filled 2021-06-30 (×2): qty 1000
  Filled 2021-06-30: qty 711
  Filled 2021-06-30: qty 1000
  Filled 2021-06-30 (×2): qty 711

## 2021-06-30 MED ORDER — ACETAMINOPHEN 160 MG/5ML PO SOLN: 650.0000 mg | ORAL | Status: DC | PRN

## 2021-06-30 NOTE — Progress Notes (Signed)
Transcranial Doppler   Date POD PCO2 HCT BP   MCA ACA PCA OPHT SIPH VERT Basilar  5/29 RH         Right  Left   *  *   *  *   *  *   9  11   48  40   *  *   *       5/30 [facility]          Right  Left    *   *    *   *    *   *    14   14    *   10    -32   -49    -46       6/2 RH          Right  Left    *   *    *   *    *   *    22   17    *   46    -16   -26    -22        6/5 RH          Right  Left    70   *       *    27   *    22   14    72   38    -28   -29    -45       6/7 MS         Right  Left    *   *    *   *    16   *    19   19    21    45    -36   -52    -49                 Right  Left                                                               Right  Left                                                       MCA = Middle Cerebral Artery      OPHT = Opthalmic Artery     BASILAR = Basilar Artery   ACA = Anterior Cerebral Artery     SIPH = Carotid Siphon PCA = Posterior Cerebral Artery   VERT = Verterbral Artery                    Normal MCA = 62+\-12 ACA = 50+\-12 PCA = 42+\-23    *= Unable to insonate  06/30/2021 11:04 AM 08/30/2021., MHA, RVT, RDCS, RDMS

## 2021-06-30 NOTE — Progress Notes (Addendum)
MD Kipp Brood made aware of CBG >160 no new orders at this time

## 2021-06-30 NOTE — Progress Notes (Signed)
Physical Therapy Treatment Patient Details Name: Yolanda Boone MRN: [id number] DOB: [birth year] Today's Date: 06/30/2021   History of Present Illness 61 y.o. female presents to [facility] hospital on 06/20/2021 after being found down by EMS. Pt with L gaze and posturing, hypoxic to 60s requiring emergent intubation 06/20/21-06/23/21 . CTH and CTA reveal large SAH with midline shift, concerning for L MCA aneurysm rupture. 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 No PMH on file.    PT Comments    Pt tolerates treatment well but continues to require significant physical assistance to perform bed mobility. Pt does demonstrate initiation of mobility with RUE, passing objects from L hand to R hand and reaching to grab at tubes with R hand once left is placed in mitt. Pt with significant balance deficits, pushing to R side and often with significant postural extension. Pt will benefit from continued acute PT services in an effort to reduce falls risk and caregiver burden. Pt is slow to progress with mobility and communication at this time, may need to pursue SNF placement if her ability to follow commands does not improve more quickly.   Recommendations for follow up therapy are one component of a multi-disciplinary discharge planning process, led by the attending physician.  Recommendations may be updated based on patient status, additional functional criteria and insurance authorization.  Follow Up Recommendations  Acute inpatient rehab (3hours/day)     Assistance Recommended at Discharge Frequent or constant Supervision/Assistance  Patient can return home with the following Two people to help with walking and/or transfers;Two people to help with bathing/dressing/bathroom;Assistance with cooking/housework;Assistance with feeding;Direct supervision/assist for medications management;Direct supervision/assist for financial  management;Assist for transportation;Help with stairs or ramp for entrance   Equipment Recommendations  Wheelchair (measurements PT);Wheelchair cushion (measurements PT);Hospital bed;Other (comment) (hoyer lift)    Recommendations for Other Services       Precautions / Restrictions Precautions Precautions: Fall Restrictions Weight Bearing Restrictions: No     Mobility  Bed Mobility Overal bed mobility: Needs Assistance Bed Mobility: Supine to Sit, Sit to Supine     Supine to sit: Total assist, +2 for physical assistance Sit to supine: Total assist, +2 for physical assistance        Transfers Overall transfer level: Needs assistance Equipment used: 2 person hand held assist Transfers: Sit to/from Stand Sit to Stand: Total assist, +2 physical assistance           General transfer comment: able to clear buttocks on 2nd attempt but does not achieve erect standing position with either attempt    Ambulation/Gait                   Stairs             Wheelchair Mobility    Modified Rankin (Stroke Patients Only) Modified Rankin (Stroke Patients Only) Pre-Morbid Rankin Score: No symptoms Modified Rankin: Severe disability     Balance Overall balance assessment: Needs assistance Sitting-balance support: Single extremity supported, Bilateral upper extremity supported, Feet supported Sitting balance-Leahy Scale: Zero Sitting balance - Comments: intermittent heavy posterior lean, pt pushing to R with LUE through railing often Postural control: Posterior lean, Right lateral lean                                  Cognition Arousal/Alertness: Awake/alert Behavior During Therapy: Flat affect Overall Cognitive Status:  Difficult to assess Area of Impairment: Safety/judgement, Awareness, Following commands                       Following Commands: Follows one step commands inconsistently, Follows one step commands with increased  time Safety/Judgement: Decreased awareness of safety, Decreased awareness of deficits Awareness: Intellectual   General Comments: pt appears to follow to rub lotion onto knees, seems to follow better with tactile cues preceded by demonstration. Does not follow commands for LEs at this time        Exercises General Exercises - Lower Extremity Ankle Circles/Pumps: PROM (60 second stretch in dorsiflexion) Heel Slides: PROM, Both, 15 reps Hip ABduction/ADduction: PROM, Both, 15 reps Hip Flexion/Marching: PROM, Both, 15 reps    General Comments General comments (skin integrity, edema, etc.): VSS on RA      Pertinent Vitals/Pain Pain Assessment Pain Assessment: Faces Faces Pain Scale: No hurt    Home Living                          Prior Function            PT Goals (current goals can now be found in the care plan section) Acute Rehab PT Goals Patient Stated Goal: to return to independence Progress towards PT goals: Progressing toward goals (very slowly, some use of RUE noted)    Frequency    Min 4X/week      PT Plan Current plan remains appropriate    Co-evaluation PT/OT/SLP Co-Evaluation/Treatment: Yes Reason for Co-Treatment: Complexity of the patient's impairments (multi-system involvement);Necessary to address cognition/behavior during functional activity;For patient/therapist safety;To address functional/ADL transfers PT goals addressed during session: Mobility/safety with mobility;Balance;Strengthening/ROM OT goals addressed during session: ADL's and self-care      AM-PAC PT "6 Clicks" Mobility   Outcome Measure  Help needed turning from your back to your side while in a flat bed without using bedrails?: Total Help needed moving from lying on your back to sitting on the side of a flat bed without using bedrails?: Total Help needed moving to and from a bed to a chair (including a wheelchair)?: Total Help needed standing up from a chair using your  arms (e.g., wheelchair or bedside chair)?: Total Help needed to walk in hospital room?: Total Help needed climbing 3-5 steps with a railing? : Total 6 Click Score: 6    End of Session   Activity Tolerance: Patient tolerated treatment well Patient left: in bed;with call bell/phone within reach;with bed alarm set;with restraints reapplied Nurse Communication: Mobility status;Need for lift equipment PT Visit Diagnosis: Other abnormalities of gait and mobility (R26.89);Muscle weakness (generalized) (M62.81);Hemiplegia and hemiparesis Hemiplegia - Right/Left: Right Hemiplegia - caused by: Nontraumatic SAH     Time: 1104-1130 PT Time Calculation (min) (ACUTE ONLY): 26 min  Charges:  $Therapeutic Activity: 8-22 mins                     Arlyss Gandy, PT, DPT Acute Rehabilitation Office [phone]    Arlyss Gandy 06/30/2021, 12:39 PM

## 2021-06-30 NOTE — Progress Notes (Signed)
NAME:  Yolanda Boone, MRN:  [id number], DOB:  December 28, 1960, LOS: 18 ADMISSION DATE:  06/20/2021, CONSULTATION DATE:  5/28 REFERRING MD:  Valda Lamb FOR CONSULT:  Vent and medical management   History of Present Illness:  Patient is encephalopathic and/or intubated. Therefore history has been obtained from chart review.   Yolanda Boone, is a 61 y.o. female, who presented to the [facility] ED with a chief complaint of being found unresponsive.  Last known normal 1615. Per report was active earlier in day. Found down later in evening. EMS called. Patient hypoxic with sats of 60% during transport.  ED course was notable for left gaze preference and posturing on arrival. Neuro was consulted. They were intubated in the ED. [facility] concerning for large volume subarachnoid hemorrhage, greatest in the left sylvian fissure, mass effect with 4 mm rightward shift.  PCCM was consulted for assistance with ventilator and medical management  Pertinent  Medical History  HTN, Thoracic aneurysm, gastric bypass, history of Graves disease.  Significant Hospital Events: Including procedures, antibiotic start and stop dates in addition to other pertinent events   5/28 present to [facility], PCCM consult, ETT 5/28 [facility] 4 SAH from L MCA aneurysm 5/28 Coil embolization  5/31 extubated 6/3 concern for possible repeat aspiration 6/2 prompting n.p.o. status and holding tube feeds overnight.  Did spike temperature of 101.7 once but pulmonary exam is benign 6/6: Diet advanced per SLP.   Interim History / Subjective:   No overnight events.  Continues to be febrile intermittently.   Objective   Blood pressure 129/86, pulse 87, temperature 99.1 F (37.3 C), temperature source Axillary, resp. rate (!) 22, height 5\' 7"  (1.702 m), weight 102.6 kg, SpO2 98 %.        Intake/Output Summary (Last 24 hours) at 06/30/2021 0754 Last data filed at 06/30/2021 0700 Gross per 24 hour  Intake 3193.77 ml  Output 1450 ml  Net 1743.77  ml    Filed Weights   06/20/21 2200 06/24/21 0600 06/30/21 0500  Weight: 114.4 kg 112.5 kg 102.6 kg   Examination:  General: Acute on chronically ill appearing elderly female lying in bed, in NAD HEENT: [place]/AT, MM pink/moist, PERRL,  Neuro: Alert and tracks appropriately. Not following commands this AM. Spontaneously moves RUE against gravity and able to move slightly RLE. No movement of the LUE or LLE.  CV: s1s2 regular rate and rhythm, no murmur, rubs, or gallops,  PULM:  Clear to auscultation in all lung fields.  GI: soft, bowel sounds active in all 4 quadrants, non-tender, non-distended, tolerating TF Extremities: warm/dry, mp edema  Skin: no rashes or lesions  Resolved problems:  Aspiration pneumonitis  Complete 5 days of antibiotics Acute respiratory failure (HCC) Patient was extubated on 5/3  Assessment & Plan:   H/H4, MF 3 subarachnoid hemorrhage  - Due to ruptured left middle cerebral artery aneurysm, s/p coil embolization Compression of brain due to nontraumatic subarachnoid hemorrhage  Acute left MCA territory stroke -Now post bleed day 9 -No vasospasm on angiogram with stability of neurological examination P: Maintain neuro protective measures Continue Nimotop with plan for 21 day course  Nutrition and bowel regiment  Seizure precautions  Continue prophylactic Keppra Aspirations precautions  Tentative plan for rehab transfer in the next 48 hours if examination remains stable  Dysphagia related to subarachnoid hemorrhage and left MCA stroke P: - Advance diet per SLP recommendations   Hx of aortic aneurysm -No sign of rupture, no pulse deficits. P: Supportive care  Best Practice (right click and "Reselect all SmartList Selections" daily)   Diet/type: tubefeeds and dysphagia diet (see orders) DVT prophylaxis: prophylactic heparin  GI prophylaxis: N/A Lines: N/A Foley:  Removed.  Code Status:  full code Last date of multidisciplinary goals of care  discussion [Per primary team]  Signature:   Dr. Jose Persia Internal Medicine PGY-3  06/30/2021, 7:54 AM

## 2021-06-30 NOTE — Progress Notes (Signed)
Inpatient Rehabilitation Admissions Coordinator   I will place rehab consult order for full assessment of potential CIR admit. An AC will follow up.  Ottie Glazier, RN, MSN Rehab Admissions Coordinator [phone] 06/30/2021 1:12 PM

## 2021-06-30 NOTE — Progress Notes (Signed)
Inpatient Rehab Admissions Coordinator:    CIR consult received. Pt. Unable to answer questions regarding interest in CIR. I placed call to pt.'s daughter and left a voicemail with request for callback. My partner will follow up on this case tomorrow.   Clemens Catholic, [place], [place] Admissions Coordinator  [phone] ([place]) [phone] (office)

## 2021-06-30 NOTE — Progress Notes (Signed)
Occupational Therapy Treatment Patient Details Name: Yolanda Boone MRN: [id number] DOB: July 25, 1960 Today's Date: 06/30/2021   History of present illness 61 y.o. female presents to [facility] hospital on 06/20/2021 after being found down by EMS. Pt with L gaze and posturing, hypoxic to 60s requiring emergent intubation 06/20/21-06/23/21 . CTH and CTA reveal large SAH with midline shift, concerning for L MCA aneurysm rupture. 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 No PMH on file.   OT comments  Yolanda Boone is making functional progress towards her acute goals. She was able to progress to the EOB with total A +2, once sitting she initially needed max A for sitting due to pushing/extending throughout her LUE. With cues and BUE occupied doing grooming tasks pt able to sit with min G and had improved forward posture. Pt noted to purposefully move and involve RUE with grooming tasks as well as handing items from her L hand to her R. She tolerated sitting for >10 minutes this session. Pt also following some visual and verbal commands this session in regard to functional tasks when given increased time to process. Attempted 2x standing with total A+2, unable to obtain full upright posture. Ot to continue to follow, d/c remains appropriate.    Recommendations for follow up therapy are one component of a multi-disciplinary discharge planning process, led by the attending physician.  Recommendations may be updated based on patient status, additional functional criteria and insurance authorization.    Follow Up Recommendations  Acute inpatient rehab (3hours/day)    Assistance Recommended at Discharge Frequent or constant Supervision/Assistance  Patient can return home with the following  Two people to help with walking and/or transfers;Two people to help with bathing/dressing/bathroom;Assistance with cooking/housework;Assistance with  feeding;Direct supervision/assist for medications management;Direct supervision/assist for financial management;Assist for transportation;Help with stairs or ramp for entrance   Equipment Recommendations  Other (comment)    Recommendations for Other Services Other (comment)    Precautions / Restrictions Precautions Precautions: Fall Restrictions Weight Bearing Restrictions: No       Mobility Bed Mobility Overal bed mobility: Needs Assistance Bed Mobility: Supine to Sit, Sit to Supine     Supine to sit: Total assist, +2 for physical assistance, HOB elevated Sit to supine: Total assist, +2 for physical assistance, HOB elevated        Transfers Overall transfer level: Needs assistance Equipment used: 2 person hand held assist Transfers: Sit to/from Stand Sit to Stand: Total assist, +2 physical assistance, +2 safety/equipment           General transfer comment: 2x attempts, unable to get fully upright     Balance Overall balance assessment: Needs assistance Sitting-balance support: Feet supported Sitting balance-Leahy Scale: Poor Sitting balance - Comments: intermittent heavy posterior lean, pt pushing to R with LUE through railing often. able to sit min G for ~1-2 minutes at a time when BUEs were occupied                                   ADL either performed or assessed with clinical judgement   ADL Overall ADL's : Needs assistance/impaired     Grooming: Wash/dry hands;Wash/dry face;Moderate assistance;Sitting Grooming Details (indicate cue type and reason): washed face in sitting with LUE and mod A to facilitate and support extremety. washed hands with assist to involve R hand. applied lotion to bilat knees to  facilitate forward sitting posture, pt able to rub lotion with R hand                 Toilet Transfer: Total assistance;+2 for physical assistance;+2 for safety/equipment Toilet Transfer Details (indicate cue type and reason):  attempted sit<>stand 2x unable to get fully upright         Functional mobility during ADLs: Total assistance;+2 for physical assistance;+2 for safety/equipment (to get to EOB) General ADL Comments: tolerated sitting for ~10 minutes    Extremity/Trunk Assessment Upper Extremity Assessment Upper Extremity Assessment: RUE deficits/detail;LUE deficits/detail RUE Deficits / Details: moving spontaneously and purposefully this session. Holding rag with hand, rubbing lotion onto leg with hand and grasping squeeze ball. Full PROM available. RUE Coordination: decreased fine motor;decreased gross motor LUE Deficits / Details: using purposefully for grooming and to grasp and release objects on command. Continues to push/extend with L when it is not occupied LUE Coordination: decreased fine motor;decreased gross motor   Lower Extremity Assessment Lower Extremity Assessment: Defer to PT evaluation        Vision   Vision Assessment?: Vision impaired- to be further tested in functional context Additional Comments: eyes open 100% of the session pt with L inattention - difficult to assess due to cog   Perception Perception Perception: Not tested   Praxis Praxis Praxis: Not tested    Cognition Arousal/Alertness: Awake/alert Behavior During Therapy: Flat affect Overall Cognitive Status: Difficult to assess                                 General Comments: eyes open 100% of the session. poor attention. follows simple one step functional cues with increased time and cues 50% of the time.        Exercises Other Exercises Other Exercises: shoulder flexion and horizonal abduction PROM Other Exercises: Elbow felxion/extension PROM Other Exercises: Forearm pronation/supination PROM    Shoulder Instructions       General Comments VSS on RA, son present at the end of the session    Pertinent Vitals/ Pain       Pain Assessment Pain Assessment: Faces Faces Pain Scale: No  hurt Pain Intervention(s): Monitored during session  Home Living                                          Prior Functioning/Environment              Frequency  Min 2X/week        Progress Toward Goals  OT Goals(current goals can now be found in the care plan section)  Progress towards OT goals: Progressing toward goals  Acute Rehab OT Goals Patient Stated Goal: unable to state OT Goal Formulation: With family Time For Goal Achievement: 07/07/21 Potential to Achieve Goals: Good ADL Goals Pt Will Perform Grooming: with max assist;sitting Additional ADL Goal #1: Pt will purposefully grasp an ADL object with LUE 50% of the time Additional ADL Goal #2: Pt will follow one step commands 50% of the time Additional ADL Goal #3: Pt will perform bed mobility at mod A +2 to come EOB in preparation for participation in ADL  Plan Discharge plan remains appropriate;Frequency remains appropriate    Co-evaluation    PT/OT/SLP Co-Evaluation/Treatment: Yes Reason for Co-Treatment: Complexity of the patient's impairments (multi-system involvement);For patient/therapist safety;To address functional/ADL  transfers PT goals addressed during session: Mobility/safety with mobility;Balance;Strengthening/ROM OT goals addressed during session: ADL's and self-care      AM-PAC OT "6 Clicks" Daily Activity     Outcome Measure   Help from another person eating meals?: Total Help from another person taking care of personal grooming?: A Lot Help from another person toileting, which includes using toliet, bedpan, or urinal?: Total Help from another person bathing (including washing, rinsing, drying)?: Total Help from another person to put on and taking off regular upper body clothing?: Total Help from another person to put on and taking off regular lower body clothing?: Total 6 Click Score: 7    End of Session    OT Visit Diagnosis: Other symptoms and signs involving  cognitive function;Other symptoms and signs involving the nervous system (R29.898)   Activity Tolerance Patient tolerated treatment well   Patient Left in bed;with call bell/phone within reach   Nurse Communication Mobility status        Time: 3825-0539 OT Time Calculation (min): 30 min  Charges: OT General Charges $OT Visit: 1 Visit OT Treatments $Therapeutic Activity: 8-22 mins   Leviathan Macera A Shelbia Scinto 06/30/2021, 2:14 PM

## 2021-06-30 NOTE — Progress Notes (Signed)
  NEUROSURGERY PROGRESS NOTE   Pt seen and examined. No issues overnight.   EXAM: Temp:  [98.6 F (37 C)-100.7 F (38.2 C)] 98.6 F (37 C) (06/07 0756) Pulse Rate:  [79-105] 89 (06/07 0800) Resp:  [17-28] 23 (06/07 0800) BP: (109-136)/(61-89) 127/73 (06/07 0800) SpO2:  [95 %-98 %] 96 % (06/07 0800) Weight:  [102.6 kg] 102.6 kg (06/07 0500) Intake/Output      06/06 0701 06/07 0700 06/07 0701 06/08 0700   I.V. (mL/kg) 1161.3 (11.3)    Other     NG/GT 2032.5    IV Piggyback     Total Intake(mL/kg) 3193.8 (31.1)    Urine (mL/kg/hr) 1450 (0.6)    Stool     Total Output 1450    Net +1743.8         Urine Occurrence 1 x     Awake, alert Non-verbal Face symmetric Not-following commands Moves LUE/LLE spontaneously, Minimal movement RUE/RLE  LABS: Lab Results  Component Value Date   CREATININE 0.63 06/28/2021   BUN 22 (H) 06/28/2021   NA 140 06/28/2021   K 4.0 06/28/2021   CL 108 06/28/2021   CO2 27 06/28/2021   Lab Results  Component Value Date   WBC 14.5 (H) 06/28/2021   HGB 11.6 (L) 06/28/2021   HCT 36.2 06/28/2021   MCV 90.3 06/28/2021   PLT 236 06/28/2021      IMPRESSION: - 60 y.o. 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    Lisbeth Renshaw, MD [facility]

## 2021-07-01 DIAGNOSIS — J9601 Acute respiratory failure with hypoxia: Secondary | ICD-10-CM | POA: Diagnosis not present

## 2021-07-01 LAB — GLUCOSE, CAPILLARY
Glucose-Capillary: 131 mg/dL — ABNORMAL HIGH (ref 70–99)
Glucose-Capillary: 121 mg/dL — ABNORMAL HIGH (ref 70–99)
Glucose-Capillary: 165 mg/dL — ABNORMAL HIGH (ref 70–99)
Glucose-Capillary: 172 mg/dL — ABNORMAL HIGH (ref 70–99)
Glucose-Capillary: 133 mg/dL — ABNORMAL HIGH (ref 70–99)

## 2021-07-01 LAB — CBC WITH DIFFERENTIAL/PLATELET
Abs Immature Granulocytes: 0.08 10*3/uL — ABNORMAL HIGH (ref 0.00–0.07)
Basophils Absolute: 0 10*3/uL (ref 0.0–0.1)
Basophils Relative: 0 %
Eosinophils Absolute: 0.2 10*3/uL (ref 0.0–0.5)
Eosinophils Relative: 2 %
HCT: 33.4 % — ABNORMAL LOW (ref 36.0–46.0)
Hemoglobin: 10.7 g/dL — ABNORMAL LOW (ref 12.0–15.0)
Immature Granulocytes: 1 %
Lymphocytes Relative: 14 %
Lymphs Abs: 1.8 10*3/uL (ref 0.7–4.0)
MCH: 29.2 pg (ref 26.0–34.0)
MCHC: 32 g/dL (ref 30.0–36.0)
MCV: 91.3 fL (ref 80.0–100.0)
Monocytes Absolute: 1.4 10*3/uL — ABNORMAL HIGH (ref 0.1–1.0)
Monocytes Relative: 11 %
Neutro Abs: 9.9 10*3/uL — ABNORMAL HIGH (ref 1.7–7.7)
Neutrophils Relative %: 72 %
Platelets: 336 10*3/uL (ref 150–400)
RBC: 3.66 MIL/uL — ABNORMAL LOW (ref 3.87–5.11)
RDW: 13 % (ref 11.5–15.5)
WBC: 13.5 10*3/uL — ABNORMAL HIGH (ref 4.0–10.5)
nRBC: 0 % (ref 0.0–0.2)

## 2021-07-01 LAB — BASIC METABOLIC PANEL
Anion gap: 6 (ref 5–15)
BUN: 28 mg/dL — ABNORMAL HIGH (ref 6–20)
CO2: 25 mmol/L (ref 22–32)
Calcium: 8.9 mg/dL (ref 8.9–10.3)
Chloride: 112 mmol/L — ABNORMAL HIGH (ref 98–111)
Creatinine, Ser: 0.69 mg/dL (ref 0.44–1.00)
GFR, Estimated: >60 mL/min (ref >60)
Glucose, Bld: 142 mg/dL — ABNORMAL HIGH (ref 70–99)
Potassium: 3.9 mmol/L (ref 3.5–5.1)
Sodium: 143 mmol/L (ref 135–145)

## 2021-07-01 MED ORDER — FLUCONAZOLE 100 MG PO TABS: 100.0000 mg | ORAL_TABLET | Freq: Every day | ORAL | Status: DC

## 2021-07-01 MED ORDER — FLUCONAZOLE 200 MG PO TABS
200.0000 mg | ORAL_TABLET | Freq: Once | ORAL | Status: DC
Start: 2021-07-01 — End: 2021-07-01

## 2021-07-01 MED ORDER — FLUCONAZOLE 200 MG PO TABS
200.0000 mg | ORAL_TABLET | Freq: Once | ORAL | Status: AC
  Administered 2021-07-01: 200 mg
  Filled 2021-07-01: qty 1

## 2021-07-01 MED ORDER — FLUCONAZOLE 100 MG PO TABS
100.0000 mg | ORAL_TABLET | Freq: Every day | ORAL | Status: AC
  Administered 2021-07-02 – 2021-07-07 (×6): 100 mg
  Filled 2021-07-01 (×6): qty 1

## 2021-07-01 NOTE — Progress Notes (Signed)
IP rehab admissions - I met with patient, her daughter and her son at the bedside.  Dtr works but plans to take [employer] to be with patient after rehab.  Son can also assist.  They understand the need for 24/7 supervision and care initially upon discharge from [facility].  I gave them rehab booklets and talked about CIR program.  Will open the case with [employer] and seek authorization for acute inpatient rehab admission.  Will update all once we hear back from insurance case manager.  Call for questions.  [phone]

## 2021-07-01 NOTE — Progress Notes (Signed)
  NEUROSURGERY PROGRESS NOTE   Pt seen and examined. No issues overnight.   EXAM: Temp:  [98.9 F (37.2 C)-100.9 F (38.3 C)] 99.8 F (37.7 C) (06/08 1141) Pulse Rate:  [80-114] 99 (06/08 1200) Resp:  [18-32] 32 (06/08 1200) BP: (82-138)/(60-101) 82/60 (06/08 1200) SpO2:  [94 %-98 %] 95 % (06/08 1200) Weight:  [102.4 kg] 102.4 kg (06/08 0500) Intake/Output      06/07 0701 06/08 0700 06/08 0701 06/09 0700   I.V. (mL/kg) 154.8 (1.5)    NG/GT 1149.2 100   Total Intake(mL/kg) 1303.9 (12.7) 100 (1)   Urine (mL/kg/hr) 1850 (0.8)    Stool 400    Total Output 2250    Net -946.1 +100        Urine Occurrence 1 x     Awake, alert Non-verbal Face symmetric Not-following commands Moves LUE/LLE spontaneously, Minimal movement RUE/RLE  LABS: Lab Results  Component Value Date   CREATININE 0.69 07/01/2021   BUN 28 (H) 07/01/2021   NA 143 07/01/2021   K 3.9 07/01/2021   CL 112 (H) 07/01/2021   CO2 25 07/01/2021   Lab Results  Component Value Date   WBC 13.5 (H) 07/01/2021   HGB 10.7 (L) 07/01/2021   HCT 33.4 (L) 07/01/2021   MCV 91.3 07/01/2021   PLT 336 07/01/2021      IMPRESSION: - 61 y.o. 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

## 2021-07-01 NOTE — Progress Notes (Signed)
Interval Progress Note:   On examination, there is evidence of thrush on the tongue with surrounding erythema. Will add a 7 day course of oral Fluconazole. Will obtain EKG given prolonged QT on prior EKG.   - Fluconazole 200 mg once, followed by Fluconazole 100 mg for 6 days - EKG pending  Signed,  Dr. Verdene Lennert Internal Medicine PGY-3  07/01/2021, 2:31 PM

## 2021-07-01 NOTE — Progress Notes (Signed)
Speech Language Pathology Treatment: Cognitive-Linquistic;Dysphagia (Aphasia)  Patient Details Name: Yolanda Boone MRN: [id number] DOB: [birth year] Today's Date: 07/01/2021 Time: 7564-3329 SLP Time Calculation (min) (ACUTE ONLY): 23 min  Assessment / Plan / Recommendation Clinical Impression  Pt was seen for treatment with her family present who denied observance of any vocalization/verbalization today. Pt was alert and cooperative during the session. She did not follow commands despite verbal and tactile prompts. Vocalizations of "mhmm" and "Yolanda Boone" were not observed. She continues to exhibit difficulty with attention and was easily distracted by the openings of containers/cups. Auditory and visual distractors were removed, but pt's inattention persisted despite support from SLP. Pt tolerated nectar thick liquids via straw without overt s/sx of aspiration. Mastication was prolonged and inconsistent with trials of dysphagia 2 solids. Oral holding was noted and pt was observed to facilitate oral clearance once SLP attempted to physically remove boluses from the oral cavity via suction/oral swab. Limited oral inspection revealed a white coated tongue; thrush is suspected and pt's RN indicated that she will follow up with the MD regarding this. SLP will continue to follow pt.     HPI HPI: Pt is a 61 y.o. female who presented to the ED on 06/20/21 after being found down by EMS. Left gaze, posturing, and hypoxia the 60's noted in the ED and pt intubated. ETT 5/28-5/31 CT head and CTA revealed large SAH with midline shift concerning for left MCA aneurysm rupture. 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 No PMH in chart.      SLP Plan  Continue with current plan of care      Recommendations for follow up therapy are one component of a multi-disciplinary discharge planning process, led by the attending physician.   Recommendations may be updated based on patient status, additional functional criteria and insurance authorization.    Recommendations  Diet recommendations: Dysphagia 1 (puree);Nectar-thick liquid Liquids provided via: Cup;Straw Medication Administration: Crushed with puree Supervision: Staff to assist with self feeding Compensations: Slow rate;Small sips/bites Postural Changes and/or Swallow Maneuvers: Seated upright 90 degrees                Oral Care Recommendations: Oral care BID;Staff/trained caregiver to provide oral care Follow Up Recommendations: Acute inpatient rehab (3hours/day) Assistance recommended at discharge: Frequent or constant Supervision/Assistance SLP Visit Diagnosis: Dysphagia, oropharyngeal phase (R13.12) Plan: Continue with current plan of care         Lenoard Helbert I. Vear Clock, MS, CCC-SLP Acute Rehabilitation Services Office number [phone] Pager [phone]   Scheryl Marten  07/01/2021, 12:01 PM

## 2021-07-01 NOTE — Progress Notes (Signed)
Nutrition Follow-up  DOCUMENTATION CODES:   Obesity unspecified  INTERVENTION:   48 hour calorie count (discussed with RN)   Breakfast/supplement: 660 kcal and 28 grams protein  Encourage PO intake at meals  - Add Hormel Shake with meals  - Add Magic cup with meals  Ensure Enlive po BID, each supplement provides 350 kcal and 20 grams of protein.  Osmolite 1.5 @ 50 ml/hr x 12 hours (600 ml) Run from 1800 - 0600 45 ml ProSource TF BID  Provides: 980 kcal, 60 grams protein, and 456 ml free water.   MVI with minerals BID to meet bariatric guidelines Calcium citrate with vitamin D 500 mg TID to meet bariatric guidelines   NUTRITION DIAGNOSIS:   Inadequate oral intake related to dysphagia as evidenced by meal completion < 50%. Ongoing.   GOAL:   Patient will meet greater than or equal to 90% of their needs Not met.   MONITOR:   PO intake, Diet advancement, TF tolerance  REASON FOR ASSESSMENT:   Consult Enteral/tube feeding initiation and management  ASSESSMENT:   Pt with PMH of HTN, thoracic aneurysm, gastric bypass, and grave's dz who was admitted with [facility] s/p coiling of ruptured R MCA aneurysm.    Pt discussed during ICU rounds and with RN and MD. Per RN, staff still using cortrak tube for medication administration.   Spoke with daughter and son who are both at bedside. Daughter showed videos of pt feeding herself some of her Breakfast. She at her fruit but otherwise she only consumed her beverages (juice, milk, ensure)  SLP continues to work with pt, pt not chewing foods yet and continues to require puree foods.   5/30 s/p cortrak placement, per xray tip in gastric body, question if this is the gastric pouch nothing noted about gastric bypass in xray.  6/1 diet advanced to Dysphagia 1 with thin liquids; ate 100% of dinner 6/2 aspiration event, NPO and TF held 6/3 TF restarted 6/6 s/p MBS started on Dysphagia diet with nectar thickened liquids   Medications  reviewed and include: oscal with D (500-5) TID, nutrisource with fiber, MVI with minerals, nimotop  Labs reviewed:  Vitamin D: 22.88 CBG: 99-165   Diet Order:   Diet Order             DIET - DYS 1 Room service appropriate? No; Fluid consistency: Nectar Thick  Diet effective now                   EDUCATION NEEDS:   No education needs have been identified at this time  Skin:  Skin Assessment: Reviewed RN Assessment  Last BM:  400 ml via rectal tube  Height:   Ht Readings from Last 1 Encounters:  06/20/21 $RemoveB'5\' 7"'[id number]$  (1.702 m)    Weight:   Wt Readings from Last 1 Encounters:  07/01/21 102.4 kg    BMI:  Body mass index is 35.36 kg/m.  Estimated Nutritional Needs:   Kcal:  1800-2000  Protein:  95-120 grams  Fluid:  >1.8 L/day  [name]., RD, LDN, CNSC See AMiON for contact information

## 2021-07-01 NOTE — Progress Notes (Signed)
NAME:  Yolanda Boone, MRN:  [id number], DOB:  12-29-1960, LOS: 11 ADMISSION DATE:  06/20/2021, CONSULTATION DATE:  5/28 REFERRING MD:  Donavan Burnet FOR CONSULT:  Vent and medical management   History of Present Illness:  Patient is encephalopathic and/or intubated. Therefore history has been obtained from chart review.   Yolanda Boone, is a 61 y.o. female, who presented to the [facility] ED with a chief complaint of being found unresponsive.  Last known normal 1615. Per report was active earlier in day. Found down later in evening. EMS called. Patient hypoxic with sats of 60% during transport.  ED course was notable for left gaze preference and posturing on arrival. Neuro was consulted. They were intubated in the ED. [facility] concerning for large volume subarachnoid hemorrhage, greatest in the left sylvian fissure, mass effect with 4 mm rightward shift.  PCCM was consulted for assistance with ventilator and medical management  Pertinent  Medical History  HTN, Thoracic aneurysm, gastric bypass, history of Graves disease.  Significant Hospital Events: Including procedures, antibiotic start and stop dates in addition to other pertinent events   5/28 present to [facility], PCCM consult, ETT 5/28 [facility] 4 SAH from L MCA aneurysm 5/28 Coil embolization  5/31 extubated 6/3 concern for possible repeat aspiration 6/2 prompting n.p.o. status and holding tube feeds overnight.  Did spike temperature of 101.7 once but pulmonary exam is benign 6/6: Diet advanced per SLP.   Interim History / Subjective:   No overnight events.  Hyperglycemia noted yesterday afternoon.   This AM, patient opens her eyes to voice appropriately. Once family arrived, patient sat up to drink juice. She appears fully alert.   Objective   Blood pressure 125/78, pulse 94, temperature 98.9 F (37.2 C), temperature source Axillary, resp. rate (!) 25, height 5\' 7"  (1.702 m), weight 102.4 kg, SpO2 97 %.        Intake/Output Summary  (Last 24 hours) at 07/01/2021 0815 Last data filed at 07/01/2021 0600 Gross per 24 hour  Intake 1253.96 ml  Output 1850 ml  Net -596.04 ml    Filed Weights   06/24/21 0600 06/30/21 0500 07/01/21 0500  Weight: 112.5 kg 102.6 kg 102.4 kg   Examination:  General: Chronically ill appearing elderly female lying in bed, in NAD HEENT: [place]/AT, MM pink/moist, PERRL,  Neuro: Alert and tracks appropriately. Not following commands this AM. Spontaneously moves LUE against gravity. RUE movement noted this AM, new finding.   CV: s1s2 regular rate and rhythm, no murmur, rubs, or gallops,  PULM:  Clear to auscultation in all lung fields.  GI: soft, bowel sounds active in all 4 quadrants, non-tender, non-distended, tolerating TF Extremities: warm/dry, mp edema  Skin: no rashes or lesions  Resolved problems:  Aspiration pneumonitis  Complete 5 days of antibiotics Acute respiratory failure (HCC) Patient was extubated on 5/3  Assessment & Plan:   H/H4, MF 3 subarachnoid hemorrhage  - Due to ruptured left middle cerebral artery aneurysm, s/p coil embolization Compression of brain due to nontraumatic subarachnoid hemorrhage  Acute left MCA territory stroke -Now post bleed day 10 -No vasospasm on angiogram with improvements of neurological examination. She was able to move her RUE today.  P: Maintain neuro protective measures Continue Nimotop with plan for 21 day course  Nutrition and bowel regiment  Seizure precautions  Continue prophylactic Keppra Aspirations precautions  Tentative plan for rehab transfer in the next 24 hours if examination remains stable  Dysphagia related to subarachnoid hemorrhage and left MCA stroke  P: - Advance diet per SLP recommendations   Hx of aortic aneurysm -No sign of rupture, no pulse deficits. P: Supportive care   Best Practice (right click and "Reselect all SmartList Selections" daily)   Diet/type: tubefeeds and dysphagia diet (see orders) DVT  prophylaxis: prophylactic heparin  GI prophylaxis: N/A Lines: N/A Foley:  Removed.  Code Status:  full code Last date of multidisciplinary goals of care discussion [Per primary team]  Signature:   Dr. Verdene Lennert Internal Medicine PGY-3  07/01/2021, 8:15 AM

## 2021-07-02 ENCOUNTER — Inpatient Hospital Stay (HOSPITAL_COMMUNITY): Payer: BC Managed Care – PPO

## 2021-07-02 DIAGNOSIS — E876 Hypokalemia: Secondary | ICD-10-CM

## 2021-07-02 DIAGNOSIS — J9601 Acute respiratory failure with hypoxia: Secondary | ICD-10-CM | POA: Diagnosis not present

## 2021-07-02 LAB — COMPREHENSIVE METABOLIC PANEL
ALT: 124 U/L — ABNORMAL HIGH (ref 0–44)
AST: 86 U/L — ABNORMAL HIGH (ref 15–41)
Albumin: 2.6 g/dL — ABNORMAL LOW (ref 3.5–5.0)
Alkaline Phosphatase: 113 U/L (ref 38–126)
Anion gap: 9 (ref 5–15)
BUN: 39 mg/dL — ABNORMAL HIGH (ref 6–20)
CO2: 22 mmol/L (ref 22–32)
Calcium: 9.2 mg/dL (ref 8.9–10.3)
Chloride: 112 mmol/L — ABNORMAL HIGH (ref 98–111)
Creatinine, Ser: 0.79 mg/dL (ref 0.44–1.00)
GFR, Estimated: >60 mL/min (ref >60)
Glucose, Bld: 104 mg/dL — ABNORMAL HIGH (ref 70–99)
Potassium: 4.5 mmol/L (ref 3.5–5.1)
Sodium: 143 mmol/L (ref 135–145)
Total Bilirubin: 0.4 mg/dL (ref 0.3–1.2)
Total Protein: 7 g/dL (ref 6.5–8.1)

## 2021-07-02 LAB — CBC WITH DIFFERENTIAL/PLATELET
Abs Immature Granulocytes: 0.1 10*3/uL — ABNORMAL HIGH (ref 0.00–0.07)
Basophils Absolute: 0 10*3/uL (ref 0.0–0.1)
Basophils Relative: 0 %
Eosinophils Absolute: 0.2 10*3/uL (ref 0.0–0.5)
Eosinophils Relative: 2 %
HCT: 38.3 % (ref 36.0–46.0)
Hemoglobin: 11.7 g/dL — ABNORMAL LOW (ref 12.0–15.0)
Immature Granulocytes: 1 %
Lymphocytes Relative: 12 %
Lymphs Abs: 1.6 10*3/uL (ref 0.7–4.0)
MCH: 28.7 pg (ref 26.0–34.0)
MCHC: 30.5 g/dL (ref 30.0–36.0)
MCV: 94.1 fL (ref 80.0–100.0)
Monocytes Absolute: 1 10*3/uL (ref 0.1–1.0)
Monocytes Relative: 7 %
Neutro Abs: 10.8 10*3/uL — ABNORMAL HIGH (ref 1.7–7.7)
Neutrophils Relative %: 78 %
Platelets: 356 10*3/uL (ref 150–400)
RBC: 4.07 MIL/uL (ref 3.87–5.11)
RDW: 12.9 % (ref 11.5–15.5)
WBC: 13.8 10*3/uL — ABNORMAL HIGH (ref 4.0–10.5)
nRBC: 0 % (ref 0.0–0.2)

## 2021-07-02 LAB — MAGNESIUM: Magnesium: 2.4 mg/dL (ref 1.7–2.4)

## 2021-07-02 LAB — GLUCOSE, CAPILLARY
Glucose-Capillary: 138 mg/dL — ABNORMAL HIGH (ref 70–99)
Glucose-Capillary: 106 mg/dL — ABNORMAL HIGH (ref 70–99)
Glucose-Capillary: 92 mg/dL (ref 70–99)
Glucose-Capillary: 95 mg/dL (ref 70–99)
Glucose-Capillary: 126 mg/dL — ABNORMAL HIGH (ref 70–99)
Glucose-Capillary: 123 mg/dL — ABNORMAL HIGH (ref 70–99)

## 2021-07-02 MED ORDER — OSMOLITE 1.5 CAL PO LIQD
600.0000 mL | ORAL | Status: DC
Start: 2021-07-02 — End: 2021-07-09
  Administered 2021-07-02 – 2021-07-09 (×7): 600 mL
  Filled 2021-07-02 (×2): qty 711
  Filled 2021-07-02 (×2): qty 1000
  Filled 2021-07-02 (×3): qty 711
  Filled 2021-07-02: qty 1000
  Filled 2021-07-02: qty 711
  Filled 2021-07-02 (×4): qty 1000

## 2021-07-02 NOTE — Progress Notes (Incomplete)
2000 temp not communicated and documented late, therefore not temp intervention implemented at this time

## 2021-07-02 NOTE — Progress Notes (Signed)
Pt noted to have HR 110-115, brief episode of hypotension, and breathing 30-40 times/minutes after working with physical therapy. Pt noted to have new taut/distended abdomen after lunch. NP Hildred Priest notified. Chest and Abd XR ordered. BP in normal limits at this time. Family updated of pt's plan of care at bedside.

## 2021-07-02 NOTE — Progress Notes (Signed)
No new issues or problems.  Patient's right hemiparesis persists.  Denies headache.  Working with therapy.  Low-grade fevers.  Vital signs are stable.  Urine output good.  Bowels are working.  Right hemiparesis stable.  Following commands on the left.  Progressing reasonably well.  Okay to move out of the unit.  Continue supportive efforts.

## 2021-07-02 NOTE — Progress Notes (Signed)
2000 temp not communicated to this  RN, therefore Tylenol not implemented at this time, but temp control (room temp adjust/ice packs).

## 2021-07-02 NOTE — Plan of Care (Signed)
  Problem: Education: Goal: Knowledge of disease or condition will improve Outcome: Progressing   Problem: Coping: Goal: Will verbalize positive feelings about self Outcome: Progressing   Problem: Health Behavior/Discharge Planning: Goal: Ability to manage health-related needs will improve Outcome: Progressing   Problem: Self-Care: Goal: Ability to participate in self-care as condition permits will improve Outcome: Progressing   Problem: Spontaneous Subarachnoid Hemorrhage Tissue Perfusion: Goal: Complications of Spontaneous Subarachnoid Hemorrhage will be minimized Outcome: Progressing   

## 2021-07-02 NOTE — Progress Notes (Signed)
Occupational Therapy Treatment Patient Details Name: Yolanda Boone MRN: [id number] DOB: 1960/04/17 Today's Date: 07/02/2021   History of present illness 61 y.o. female presents to [facility] hospital on 06/20/2021 after being found down by EMS. Pt with L gaze and posturing, hypoxic to 60s requiring emergent intubation 06/20/21-06/23/21 . CTH and CTA reveal large SAH with midline shift, concerning for L MCA aneurysm rupture. 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 No PMH on file.   OT comments  Pt making very limited progress. Moving RUE spontaneously - daughter reports that she is using it during self-feeding to hold cups but trouble grasping utensils, created built up handle to use. Pt did use RUE to rub in lotion together with LUE in midline, does not sustain midline without assist from LUE. Pt not following commands at all today even with visual and/or verbal cues. Pt does make various facial expressions, but did not try and verbalize during session. Pt total A +2 for bed mobility, total A +2/3 for sit<>stand from EOB. Bil knees buckling, requiring blocking 2 person HHA and boost from behind. POC changed to SNF post-acute as Pt is not following commands, remains total A for most aspects of ADL - need to focus on functional grooming engaging patient in activities to observe and encourage BUE use and command following. OT will continue to follow acutely.   Recommendations for follow up therapy are one component of a multi-disciplinary discharge planning process, led by the attending physician.  Recommendations may be updated based on patient status, additional functional criteria and insurance authorization.    Follow Up Recommendations  Skilled nursing-short term rehab (<3 hours/day)    Assistance Recommended at Discharge Frequent or constant Supervision/Assistance  Patient can return home with the following  Two  people to help with walking and/or transfers;Two people to help with bathing/dressing/bathroom;Assistance with cooking/housework;Assistance with feeding;Direct supervision/assist for medications management;Direct supervision/assist for financial management;Assist for transportation;Help with stairs or ramp for entrance   Equipment Recommendations  Other (comment) (TBD)    Recommendations for Other Services      Precautions / Restrictions Precautions Precautions: Fall Precaution Comments: intubated Restrictions Weight Bearing Restrictions: No       Mobility Bed Mobility Overal bed mobility: Needs Assistance Bed Mobility: Supine to Sit, Sit to Supine     Supine to sit: Total assist, +2 for physical assistance, HOB elevated Sit to supine: Total assist, +2 for physical assistance, HOB elevated        Transfers Overall transfer level: Needs assistance Equipment used:  (3 person assist, bilateral knee block, one assist posteriorly to aide in hip extension) Transfers: Sit to/from Stand Sit to Stand: Total assist, +2 physical assistance, From elevated surface           General transfer comment: pt stands twice in 3 attempts. Pt requires BUE support, facilitation of lean, foreceful extension at knees and hips. Pt with trunk flexion in both successful standing attempts. 3rd attempt pt with posterior pushing/lean     Balance Overall balance assessment: Needs assistance Sitting-balance support: Single extremity supported, Feet supported Sitting balance-Leahy Scale: Poor Sitting balance - Comments: variable during session, intermittent pushing to right side with LUE, pt also resting onto LUE during session, initially with posterior lean but this improves with time in sitting Postural control: Posterior lean, Right lateral lean, Left lateral lean   Standing balance-Leahy Scale: Zero Standing balance comment: totalA x3  ADL either performed or  assessed with clinical judgement   ADL Overall ADL's : Needs assistance/impaired     Grooming: Moderate assistance;Oral care;Wash/dry hands;Bed level (HOB elevated) Grooming Details (indicate cue type and reason): RN initiating oral care, hand over hand assist to facilitate. applying lotion to LLE with LUE and then grasping RUE interweaving fingers at midline - most movement coming from LUE this session             Lower Body Dressing: Total assistance   Toilet Transfer: Total assistance;+2 for physical assistance;+2 for safety/equipment Toilet Transfer Details (indicate cue type and reason): attempted sit<>stand 2x see transfer section below Toileting- Clothing Manipulation and Hygiene: Total assistance              Extremity/Trunk Assessment Upper Extremity Assessment Upper Extremity Assessment: RUE deficits/detail;LUE deficits/detail RUE Deficits / Details: daughter reports that she will grasp cup, but struggles with spoon - provided built up handle for daughter during self feeding, moved spontenously and used for "pushing" LUE Deficits / Details: using purposefully for grooming and to grasp and release objects but not on command. Continues to push/extend with L when it is not occupie, and grasps bed rail            Vision   Vision Assessment?: Vision impaired- to be further tested in functional context Additional Comments: L innattention, will attend with tactile cues more than verbal/auditory cues   Perception     Praxis      Cognition Arousal/Alertness: Awake/alert Behavior During Therapy: Flat affect Overall Cognitive Status: Difficult to assess Area of Impairment: Attention, Following commands, Safety/judgement, Awareness, Problem solving                   Current Attention Level: Focused   Following Commands:  (does not follow commands) Safety/Judgement: Decreased awareness of safety, Decreased awareness of deficits Awareness:  Intellectual Problem Solving: Slow processing, Decreased initiation General Comments: eyes open, very expressive facial expressions, attends to tactile > visual stimulation but does not follow verbal or visual cues/commands. Pt intermittently will follow through with functional tasks with use of UEs, ie rubbing in lotion        Exercises      Shoulder Instructions       General Comments pt with tachypnea during session up to 41, HR max observed at 125. BP stable in 110s/70s during sitting. Upon return to supine BP in 85/64, RN made aware.    Pertinent Vitals/ Pain       Pain Assessment Pain Assessment: Faces Faces Pain Scale: No hurt Pain Intervention(s): Monitored during session, Repositioned  Home Living                                          Prior Functioning/Environment              Frequency  Min 2X/week        Progress Toward Goals  OT Goals(current goals can now be found in the care plan section)  Progress towards OT goals: Not progressing toward goals - comment  Acute Rehab OT Goals Patient Stated Goal: unable to state OT Goal Formulation: Patient unable to participate in goal setting Time For Goal Achievement: 07/07/21 Potential to Achieve Goals: Good  Plan Frequency remains appropriate;Discharge plan needs to be updated    Co-evaluation    PT/OT/SLP Co-Evaluation/Treatment: Yes Reason for Co-Treatment: Complexity of  the patient's impairments (multi-system involvement);Necessary to address cognition/behavior during functional activity;For patient/therapist safety;To address functional/ADL transfers PT goals addressed during session: Mobility/safety with mobility;Balance;Strengthening/ROM OT goals addressed during session: ADL's and self-care;Strengthening/ROM;Other (comment) (cognition, vision)      AM-PAC OT "6 Clicks" Daily Activity     Outcome Measure   Help from another person eating meals?: Total Help from another person  taking care of personal grooming?: A Lot Help from another person toileting, which includes using toliet, bedpan, or urinal?: Total Help from another person bathing (including washing, rinsing, drying)?: Total Help from another person to put on and taking off regular upper body clothing?: Total Help from another person to put on and taking off regular lower body clothing?: Total 6 Click Score: 7    End of Session Equipment Utilized During Treatment: Gait belt  OT Visit Diagnosis: Other symptoms and signs involving cognitive function;Other symptoms and signs involving the nervous system (R29.898)   Activity Tolerance Patient tolerated treatment well   Patient Left in bed;with call bell/phone within reach;with family/visitor present   Nurse Communication Mobility status (HR, RR)        Time: [id number] OT Time Calculation (min): 33 min  Charges: OT General Charges $OT Visit: 1 Visit OT Treatments $Therapeutic Activity: 8-22 mins  Nyoka CowdenLaura H OTR/L Acute Rehabilitation Services Pager: [phone] Office: [phone]  Evern BioLaura J Beola Vasallo 07/02/2021, 12:31 PM

## 2021-07-02 NOTE — Progress Notes (Signed)
Inpatient Rehab Admissions Coordinator:    Pt. Is not ready for CIR. PT/OT now recommending SNF. I will follow up with family and see if they want to pursue SNF ( I have auth for CIR but can withdraw).  Megan Salon, MS, CCC-SLP Rehab Admissions Coordinator  [phone] (celll) [phone] (office)

## 2021-07-02 NOTE — Progress Notes (Signed)
Transcranial Doppler   Date POD PCO2 HCT BP   MCA ACA PCA OPHT SIPH VERT Basilar  5/29 RH         Right  Left   *  *   *  *   *  *   9  11   48  40   *  *   *       5/30 [facility]          Right  Left    *   *    *   *    *   *    14   14    *   10    -32   -49    -46       6/2 RH          Right  Left    *   *    *   *    *   *    22   17    *   46    -16   -26    -22        6/5 RH          Right  Left    70   *       *    27   *    22   14    72   38    -28   -29    -45       6/7 MS         Right  Left    *   *    *   *    16   *    19   19    21    45    -36   -52    -49        6/9 JH         Right  Left   * *  *   *    *   *    13   *    *   27    -36   -51    -29                 Right  Left                                                       MCA = Middle Cerebral Artery      OPHT = Opthalmic Artery     BASILAR = Basilar Artery   ACA = Anterior Cerebral Artery     SIPH = Carotid Siphon PCA = Posterior Cerebral Artery   VERT = Verterbral Artery                    Normal MCA = 62+\-12 ACA = 50+\-12 PCA = 42+\-23    *= Unable to insonate   Results can be found under chart review under CV PROC. 07/02/2021 5:10 PM Oriel Rumbold RVT, RDMS

## 2021-07-02 NOTE — Progress Notes (Signed)
Physical Therapy Treatment Patient Details Name: Yolanda Boone MRN: [id number] DOB: April 30, 1960 Today's Date: 07/02/2021   History of Present Illness 61 y.o. female presents to [facility] hospital on 06/20/2021 after being found down by EMS. Pt with L gaze and posturing, hypoxic to 60s requiring emergent intubation 06/20/21-06/23/21 . CTH and CTA reveal large SAH with midline shift, concerning for L MCA aneurysm rupture. 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 No PMH on file.    PT Comments    Pt tolerates treatment well but continues to demonstrate no abilities to follow verbal or visual cues. Pt requires initiation of movement of upper extremities in order to follow through with functional tasks. Pt requires totalA for all functional mobility due to poor command following and weakness. Pt has demonstrate no progress in her ability to follow commands. PT changes discharge recommendations to SNF placement as the pt will likely need increased time to make functional recovery.  Recommendations for follow up therapy are one component of a multi-disciplinary discharge planning process, led by the attending physician.  Recommendations may be updated based on patient status, additional functional criteria and insurance authorization.  Follow Up Recommendations  Skilled nursing-short term rehab (<3 hours/day)     Assistance Recommended at Discharge Frequent or constant Supervision/Assistance  Patient can return home with the following Two people to help with walking and/or transfers;Two people to help with bathing/dressing/bathroom;Assistance with cooking/housework;Assistance with feeding;Direct supervision/assist for medications management;Direct supervision/assist for financial management;Assist for transportation;Help with stairs or ramp for entrance   Equipment Recommendations  Wheelchair (measurements PT);Wheelchair  cushion (measurements PT);Hospital bed;Other (comment) (hoyer lift)    Recommendations for Other Services       Precautions / Restrictions Precautions Precautions: Fall Precaution Comments: intubated Restrictions Weight Bearing Restrictions: No     Mobility  Bed Mobility Overal bed mobility: Needs Assistance Bed Mobility: Supine to Sit, Sit to Supine     Supine to sit: Total assist, +2 for physical assistance, HOB elevated Sit to supine: Total assist, +2 for physical assistance, HOB elevated        Transfers Overall transfer level: Needs assistance Equipment used:  (3 person assist, bilateral knee block, one assist posteriorly to aide in hip extension) Transfers: Sit to/from Stand Sit to Stand: Total assist, +2 physical assistance, From elevated surface           General transfer comment: pt stands twice in 3 attempts. Pt requires BUE support, facilitation of lean, foreceful extension at knees and hips. Pt with trunk flexion in both successful standing attempts. 3rd attempt pt with posterior pushing/lean    Ambulation/Gait                   Stairs             Wheelchair Mobility    Modified Rankin (Stroke Patients Only) Modified Rankin (Stroke Patients Only) Pre-Morbid Rankin Score: No symptoms Modified Rankin: Severe disability     Balance Overall balance assessment: Needs assistance Sitting-balance support: Single extremity supported, Feet supported Sitting balance-Leahy Scale: Poor Sitting balance - Comments: variable during session, intermittent pushing to right side with LUE, pt also resting onto LUE during session, initially with posterior lean but this improves with time in sitting Postural control: Posterior lean, Right lateral lean, Left lateral lean   Standing balance-Leahy Scale: Zero Standing balance comment: totalA x3  Cognition Arousal/Alertness: Awake/alert Behavior During Therapy: Flat  affect Overall Cognitive Status: Difficult to assess Area of Impairment: Attention, Following commands, Safety/judgement, Awareness, Problem solving                   Current Attention Level: Focused   Following Commands:  (does not follow commands) Safety/Judgement: Decreased awareness of safety, Decreased awareness of deficits   Problem Solving: Slow processing, Decreased initiation General Comments: eyes open, attends to tactile and visual stimulation but does not follow verbal or visual cues/commands. Pt intermittently will follow through with functional tasks with use of UEs, ie rubbing in lotion        Exercises      General Comments General comments (skin integrity, edema, etc.): pt with tachypnea during session up to 41, HR max observed at 125. BP stable in 110s/70s during sitting. Upon return to supine BP in 85/64, RN made aware.      Pertinent Vitals/Pain Pain Assessment Pain Assessment: Faces Faces Pain Scale: No hurt    Home Living                          Prior Function            PT Goals (current goals can now be found in the care plan section) Acute Rehab PT Goals Patient Stated Goal: to return to independence Progress towards PT goals: Not progressing toward goals - comment (difficulty following commands limiting progress)    Frequency    Min 3X/week      PT Plan Discharge plan needs to be updated    Co-evaluation PT/OT/SLP Co-Evaluation/Treatment: Yes Reason for Co-Treatment: Complexity of the patient's impairments (multi-system involvement);Necessary to address cognition/behavior during functional activity;For patient/therapist safety;To address functional/ADL transfers PT goals addressed during session: Mobility/safety with mobility;Balance;Strengthening/ROM OT goals addressed during session: ADL's and self-care;Strengthening/ROM;Other (comment) (cognition, vision)      AM-PAC PT "6 Clicks" Mobility   Outcome Measure   Help needed turning from your back to your side while in a flat bed without using bedrails?: Total Help needed moving from lying on your back to sitting on the side of a flat bed without using bedrails?: Total Help needed moving to and from a bed to a chair (including a wheelchair)?: Total Help needed standing up from a chair using your arms (e.g., wheelchair or bedside chair)?: Total Help needed to walk in hospital room?: Total Help needed climbing 3-5 steps with a railing? : Total 6 Click Score: 6    End of Session   Activity Tolerance: Patient tolerated treatment well Patient left: in bed;with call bell/phone within reach;with bed alarm set;with family/visitor present Nurse Communication: Mobility status;Need for lift equipment PT Visit Diagnosis: Other abnormalities of gait and mobility (R26.89);Muscle weakness (generalized) (M62.81);Hemiplegia and hemiparesis Hemiplegia - Right/Left: Right Hemiplegia - dominant/non-dominant: Dominant Hemiplegia - caused by: Nontraumatic SAH     Time: 9379-0240 PT Time Calculation (min) (ACUTE ONLY): 34 min  Charges:  $Therapeutic Activity: 8-22 mins                     Arlyss Gandy, PT, DPT Acute Rehabilitation Office [phone]    Arlyss Gandy 07/02/2021, 12:43 PM

## 2021-07-02 NOTE — Progress Notes (Signed)
NAME:  Yolanda Boone, MRN:  [id number], DOB:  06-02-1960, LOS: 12 ADMISSION DATE:  06/20/2021, CONSULTATION DATE:  5/28 REFERRING MD:  Valda Lamb FOR CONSULT:  Vent and medical management   History of Present Illness:  Patient is encephalopathic and/or intubated. Therefore history has been obtained from chart review.   Yolanda Boone, is a 61 y.o. female, who presented to the [facility] ED with a chief complaint of being found unresponsive.  Last known normal 1615. Per report was active earlier in day. Found down later in evening. EMS called. Patient hypoxic with sats of 60% during transport.  ED course was notable for left gaze preference and posturing on arrival. Neuro was consulted. They were intubated in the ED. [facility] concerning for large volume subarachnoid hemorrhage, greatest in the left sylvian fissure, mass effect with 4 mm rightward shift.  PCCM was consulted for assistance with ventilator and medical management  Pertinent  Medical History  HTN, Thoracic aneurysm, gastric bypass, history of Graves disease.  Significant Hospital Events: Including procedures, antibiotic start and stop dates in addition to other pertinent events   5/28 present to [facility], PCCM consult, ETT 5/28 Hunt Hess 4 SAH from L MCA aneurysm 5/28 Coil embolization  5/31 extubated 6/3 concern for possible repeat aspiration 6/2 prompting n.p.o. status and holding tube feeds overnight.  Did spike temperature of 101.7 once but pulmonary exam is benign 6/6: Diet advanced per SLP 6/9: Tachypnea with PT that took +1 hour to resolve. Abdominal distention   Interim History / Subjective:   No overnight events. No labs obtained today.   This AM at approximately 10:30 AM, patient was noted to be tachycardic with tachypnea. Due to this, patient was laid down. Vital signs at that time demonstrated RR between 30-40, HR ~ 120 and hypotension as low as 84/55. After resting, tachycardia and hypotension resolved, however tachypnea  persisted for over 1 hour.   This afternoon on evaluation, patient is resting comfortably. She remains aphasic.   Objective   Blood pressure 123/87, pulse 95, temperature 98.8 F (37.1 C), temperature source Oral, resp. rate (!) 26, height 5\' 7"  (1.702 m), weight 103.4 kg, SpO2 95 %.        Intake/Output Summary (Last 24 hours) at 07/02/2021 1430 Last data filed at 07/02/2021 1200 Gross per 24 hour  Intake 1856.66 ml  Output 3650 ml  Net -1793.34 ml   Filed Weights   06/30/21 0500 07/01/21 0500 07/02/21 0500  Weight: 102.6 kg 102.4 kg 103.4 kg   Examination:  General: Chronically ill appearing elderly female lying in bed, in NAD HEENT: [place]/AT, MM pink/moist, PERRL Neuro: Alert and tracks appropriately. Not following commands. Spontaneously moves LUE against gravity. No changes in neurological examination compared to prior.  CV: Regular rate and rhythm. No murmurs, rubs or gallops.  PULM:  Clear to auscultation in all lung fields. No increased work of breathing. Mild tachypnea with no accessory muscle use.  GI: Distended compared to prior examination, however no rigidity. No tenderness to palpation or guarding. Bowel sounds active in all 4 quadrants. No fluid wave.  Extremities: warm/dry, No peripheral edema.  Skin: no rashes or lesions  Resolved problems:  Aspiration pneumonitis  Complete 5 days of antibiotics Acute respiratory failure (HCC) Patient was extubated on 5/3  Assessment & Plan:   # Suspected Ileus vs Low-Grade SBO New development of abdominal distention this afternoon. Per RN, small BM this AM with mostly flatus. No hematochezia or melena. On examination, abdomen is distended however  bowel sounds are presents throughout. KUB with possible ileus versus partial SBO. Will obtain CT imaging to further evaluate.   - CT abdomen pending - Hold all tube feeds - If SBO is confirmed, will need to place NGT and start low intermittent suction  - Maintain K > 4 and Mg > 2  -  Will consider starting IVF if tube feeds cannot be restarted   # Sinus tachycardia  Per telemetry review, patient's resting HR is in the mid-high 90s-100s. She is frequently tachycardic with minimal exertion. This AM, sinus tachycardia up to 120s. It is possible acute distention is contributory, however suspect there is a large aspect of deconditioning.   - Telemetry monitoring   # Recurrent Fevers  Patient has been experiencing daily intermittent fevers since 06/21/2021. She has been treated for aspiration pneumonia. At this time, low suspicion for acute infection. Given extensive nature of SAH and CVAs, this is likely central but will rule out DVT with dopplers.   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  P: Maintain neuro protective measures Continue Nimotop with plan for 21 day course  Seizure precautions  Continue prophylactic Keppra Aspirations precautions   Dysphagia related to subarachnoid hemorrhage and left MCA stroke P: - Continue working with SLP  Hx of aortic aneurysm -No sign of rupture, no pulse deficits. P: Supportive care   Best Practice (right click and "Reselect all SmartList Selections" daily)   Diet/type: tubefeeds and dysphagia diet (see orders) DVT prophylaxis: prophylactic heparin  GI prophylaxis: N/A Lines: N/A Foley:  Removed.  Code Status:  full code Last date of multidisciplinary goals of care discussion [Per primary team]  Signature:   Dr. Jose Persia Internal Medicine PGY-3  07/02/2021, 2:30 PM

## 2021-07-02 NOTE — Progress Notes (Addendum)
Nutrition Follow-up  DOCUMENTATION CODES:   Obesity unspecified  INTERVENTION:   Discussed with MD -  D/C nocturnal TF Keep cortrak in place for now  NPO  MVI with minerals BID to meet bariatric guidelines Calcium citrate with vitamin D 500 mg TID to meet bariatric guidelines   NUTRITION DIAGNOSIS:   Inadequate oral intake related to dysphagia as evidenced by meal completion < 50%. Ongoing.   GOAL:   Patient will meet greater than or equal to 90% of their needs Not met.   MONITOR:   PO intake, Diet advancement, TF tolerance  REASON FOR ASSESSMENT:   Consult Enteral/tube feeding initiation and management  ASSESSMENT:   Pt with PMH of HTN, thoracic aneurysm, gastric bypass, and grave's dz who was admitted with [facility] s/p coiling of ruptured R MCA aneurysm.    Pt discussed during ICU rounds and with RN and MD.  Calorie count reviewed. Pt eating some solid foods, mostly sweet foods like fruit and waffle.  She does well with the supplements. She has consumed the Hormel Shake, magic cup and ensure supplements.  After rounds RN reports pt tachy, abd xray obtained. Per xray pt with ileus vs SBO. Discussed with MD, keep cortrak but d/c TF.   5/30 s/p cortrak placement, per xray tip in gastric body, question if this is the gastric pouch nothing noted about gastric bypass in xray.  6/1 diet advanced to Dysphagia 1 with thin liquids; ate 100% of dinner 6/2 aspiration event, NPO and TF held 6/3 TF restarted 6/6 s/p MBS started on Dysphagia diet with nectar thickened liquids   Medications reviewed and include: oscal with D (500-5) TID, nutrisource with fiber, MVI with minerals, nimotop  Labs reviewed:  Vitamin D: 22.88 CBG: 92-165   Diet Order:   Diet Order             Diet NPO time specified  Diet effective now                   EDUCATION NEEDS:   No education needs have been identified at this time  Skin:  Skin Assessment: Reviewed RN Assessment  Last  BM:  1400 ml via rectal tube  Height:   Ht Readings from Last 1 Encounters:  06/20/21 5' 7" (1.702 m)    Weight:   Wt Readings from Last 1 Encounters:  07/02/21 103.4 kg    BMI:  Body mass index is 35.7 kg/m.  Estimated Nutritional Needs:   Kcal:  1800-2000  Protein:  95-120 grams  Fluid:  >1.8 L/day  [name]., RD, LDN, CNSC See AMiON for contact information

## 2021-07-03 ENCOUNTER — Inpatient Hospital Stay (HOSPITAL_COMMUNITY): Payer: BC Managed Care – PPO

## 2021-07-03 DIAGNOSIS — R000 Tachycardia, unspecified: Secondary | ICD-10-CM

## 2021-07-03 DIAGNOSIS — R509 Fever, unspecified: Secondary | ICD-10-CM

## 2021-07-03 DIAGNOSIS — I82409 Acute embolism and thrombosis of unspecified deep veins of unspecified lower extremity: Secondary | ICD-10-CM

## 2021-07-03 HISTORY — DX: Acute embolism and thrombosis of unspecified deep veins of unspecified lower extremity: I82.409

## 2021-07-03 LAB — GLUCOSE, CAPILLARY
Glucose-Capillary: 113 mg/dL — ABNORMAL HIGH (ref 70–99)
Glucose-Capillary: 89 mg/dL (ref 70–99)
Glucose-Capillary: 101 mg/dL — ABNORMAL HIGH (ref 70–99)
Glucose-Capillary: 183 mg/dL — ABNORMAL HIGH (ref 70–99)
Glucose-Capillary: 110 mg/dL — ABNORMAL HIGH (ref 70–99)
Glucose-Capillary: 128 mg/dL — ABNORMAL HIGH (ref 70–99)

## 2021-07-03 MED ORDER — ENOXAPARIN SODIUM 100 MG/ML IJ SOSY
100.0000 mg | PREFILLED_SYRINGE | Freq: Once | INTRAMUSCULAR | Status: AC
  Administered 2021-07-03: 100 mg via SUBCUTANEOUS
  Filled 2021-07-03: qty 1

## 2021-07-03 MED ORDER — ENOXAPARIN SODIUM 100 MG/ML IJ SOSY
100.0000 mg | PREFILLED_SYRINGE | Freq: Two times a day (BID) | INTRAMUSCULAR | Status: DC
  Administered 2021-07-04 – 2021-07-16 (×24): 100 mg via SUBCUTANEOUS
  Filled 2021-07-03 (×28): qty 1

## 2021-07-03 NOTE — Progress Notes (Signed)
Inpatient Rehab Admissions Coordinator:    I spoke with Pt.'s daughter over the phone regarding CIR and PT/OT changing recommendation to SNF. She requests that we follow pt. For one more PT/OT visit before signing off to be sure Pt. Cannot tolerate. I will do this.   Megan Salon, MS, CCC-SLP Rehab Admissions Coordinator  [phone] (celll) [phone] (office)

## 2021-07-03 NOTE — Progress Notes (Signed)
ANTICOAGULATION CONSULT NOTE - Initial Consult  Pharmacy Consult for Lovenox Indication: acute bilateral DVT  No Known Allergies  Patient Measurements: Height: 5\' 7"  (170.2 cm) Weight: 104.1 kg (229 lb 8 oz) IBW/kg (Calculated) : 61.6  Vital Signs: Temp: 99.2 F (37.3 C) (06/10 1200) Temp Source: Axillary (06/10 1200) BP: 127/76 (06/10 1100) Pulse Rate: 90 (06/10 1100)  Labs: Recent Labs    07/01/21 0428 07/02/21 1543  HGB 10.7* 11.7*  HCT 33.4* 38.3  PLT 336 356  CREATININE 0.69 0.79    Estimated Creatinine Clearance: 95.1 mL/min (by C-G formula based on SCr of 0.79 mg/dL).   Medical History: History reviewed. No pertinent past medical history.  Medications:  Medications Prior to Admission  Medication Sig Dispense Refill Last Dose   amLODipine (NORVASC) 5 MG tablet Take 5 mg by mouth daily.   unk   carvedilol (COREG) 6.25 MG tablet Take 6.25 mg by mouth 2 (two) times daily with a meal.       Assessment: 12 yof admitted with ruptured brain aneurysm s/p angiogram with coil embolization. Bilateral LE duplex revealed acute bilateral DVT. Pharmacy consulted to begin Lovenox. Renal function is normal and CBC is stable. No overt bleeding noted. Last heparin SQ was ~6a this morning.  Goal of Therapy:  Anti-Xa level 0.6-1 units/ml 4hrs after LMWH dose given Monitor platelets by anticoagulation protocol: Yes   Plan:  Lovenox 100 mg SQ q12h CBC q72h while on Lovenox Monitor for s/sx of bleeding  Thank you for involving pharmacy in this patient's care.  41, PharmD, BCPS Clinical Pharmacist Clinical phone for 07/03/2021 until 3p is [phone] 07/03/2021 1:50 PM  **Pharmacist phone directory can be found on [email or web address] listed under [facility]**

## 2021-07-03 NOTE — Progress Notes (Signed)
Looks better today.  Patient awake appears comfortable.  Minimal efforts at verbalization.  Still with right-sided hemiparesis.  Abdomen is soft.  She is currently afebrile.  Her vitals are stable.  Her abdomen is soft.  Continue present supportive efforts.  No change in plans at this time.

## 2021-07-03 NOTE — Progress Notes (Addendum)
VASCULAR LAB    Bilateral lower extremitiy venous duplex has been performed.  See CV proc for preliminary results.  Messaged results to Ashok Cordia, RN via secure chat  Sherren Kerns, RVT 07/03/2021, 12:29 PM

## 2021-07-03 NOTE — Plan of Care (Signed)
  Problem: Education: Goal: Knowledge of disease or condition will improve Outcome: Progressing   Problem: Coping: Goal: Will verbalize positive feelings about self Outcome: Progressing   Problem: Health Behavior/Discharge Planning: Goal: Ability to manage health-related needs will improve Outcome: Progressing   Problem: Spontaneous Subarachnoid Hemorrhage Tissue Perfusion: Goal: Complications of Spontaneous Subarachnoid Hemorrhage will be minimized Outcome: Progressing

## 2021-07-04 DIAGNOSIS — I82403 Acute embolism and thrombosis of unspecified deep veins of lower extremity, bilateral: Secondary | ICD-10-CM

## 2021-07-04 DIAGNOSIS — J9601 Acute respiratory failure with hypoxia: Secondary | ICD-10-CM | POA: Diagnosis not present

## 2021-07-04 LAB — GLUCOSE, CAPILLARY
Glucose-Capillary: 114 mg/dL — ABNORMAL HIGH (ref 70–99)
Glucose-Capillary: 118 mg/dL — ABNORMAL HIGH (ref 70–99)
Glucose-Capillary: 127 mg/dL — ABNORMAL HIGH (ref 70–99)
Glucose-Capillary: 89 mg/dL (ref 70–99)
Glucose-Capillary: 90 mg/dL (ref 70–99)
Glucose-Capillary: 92 mg/dL (ref 70–99)

## 2021-07-04 NOTE — Progress Notes (Signed)
NAME:  Yolanda Boone, MRN:  [id number], DOB:  July 04, 1960, LOS: 14 ADMISSION DATE:  06/20/2021, CONSULTATION DATE:  5/28 REFERRING MD:  Donavan Burnet FOR CONSULT:  Vent and medical management   History of Present Illness:  Patient is encephalopathic and/or intubated. Therefore history has been obtained from chart review.   Yolanda Boone, is a 61 y.o. female, who presented to the [facility] ED with a chief complaint of being found unresponsive.  Last known normal 1615. Per report was active earlier in day. Found down later in evening. EMS called. Patient hypoxic with sats of 60% during transport.  ED course was notable for left gaze preference and posturing on arrival. Neuro was consulted. They were intubated in the ED. [facility] concerning for large volume subarachnoid hemorrhage, greatest in the left sylvian fissure, mass effect with 4 mm rightward shift.  PCCM was consulted for assistance with ventilator and medical management  Pertinent  Medical History  HTN, Thoracic aneurysm, gastric bypass, history of Graves disease.  Significant Hospital Events: Including procedures, antibiotic start and stop dates in addition to other pertinent events   5/28 present to [facility], PCCM consult, ETT 5/28 Hunt Hess 4 SAH from L MCA aneurysm 5/28 Coil embolization  5/31 extubated 6/3 concern for possible repeat aspiration 6/2 prompting n.p.o. status and holding tube feeds overnight.  Did spike temperature of 101.7 once but pulmonary exam is benign 6/6: Diet advanced per SLP 6/9: Tachypnea with PT that took +1 hour to resolve. Abdominal distention  6/10 Bilateral DVT's in LE  Interim History / Subjective:   Fever improved. Patient is starting to use right arm and speaking a few words..   Objective   Blood pressure (!) 119/91, pulse (!) 102, temperature 98.8 F (37.1 C), temperature source Axillary, resp. rate 19, height 5\' 7"  (1.702 m), weight 100 kg, SpO2 99 %.        Intake/Output Summary (Last 24 hours)  at 07/04/2021 0945 Last data filed at 07/04/2021 0700 Gross per 24 hour  Intake 966.66 ml  Output 1700 ml  Net -733.34 ml    Filed Weights   07/02/21 0500 07/03/21 0400 07/04/21 0300  Weight: 103.4 kg 104.1 kg 100 kg   Examination:  General: Chronically ill appearing elderly female lying in bed, in NAD HEENT: [place]/AT, MM pink/moist, PERRL Neuro: Alert and tracks appropriately. Not following commands. Spontaneously moves LUE against gravity. Now moving right UE CV: Regular rate and rhythm. No murmurs, rubs or gallops.  PULM:  Clear to auscultation in all lung fields. No increased work of breathing. Mild tachypnea with no accessory muscle use.  GI: Distended compared to prior examination, however no rigidity. No tenderness to palpation or guarding. Bowel sounds active in all 4 quadrants. No fluid wave.  Extremities: warm/dry, No peripheral edema.  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  Now post bleed day 14 No vasospasm on angiogram Inadequate acoustic windows on TCD 5/30 Echo normal 5/30  CT 5/31 shows stable bleed but evidence of MCA infarction.   Presently she is awake and move left side as expected. Aphasia continues, but starting to move right hand  -Allow permissive hypertension with SBP 120-180 -Stopped IV fluids yesterday. - should be able to move CIR now as no signs of  vasospasm  Acute respiratory failure (HCC) Intubated for airway protection. Tolerated successful extubation last week.   - SLP has cleared for dysphagia 1 diet.  - Still not mobilizing to chair.   Compression of brain due to nontraumatic subarachnoid hemorrhage (HCC) 73mm of midline shift - improved on CT 5/30  - No hyperosmolar therapies  needed - No EVD needed - Follow clinically  Hx of aortic aneurysm No sign of rupture, no pulse deficits.  - Will need to balance need for permissive hypertension to prevent vasospasm with the risk of further dissection.   DVT of lower extremity, bilateral (HCC) Noted on Doppler. Likely accounts for fever and tachycardia.   -Currently on room air and hemodynamically stable so no need for intervention beyond anticoagulation.  - Currently on Lovenox at treatment dose, would continue this until decision made regarding PEG tube if not eating sufficiently. Can then convert to Eliquis and treat for minimum of 3 months or longer if she remains immobile.   Ready to transfer out. PCCM will sign off.   Best Practice (right click and "Reselect all SmartList Selections" daily)   Diet/type: tubefeeds and dysphagia diet (see orders) DVT prophylaxis: prophylactic heparin  GI prophylaxis: N/A Lines: N/A Foley:  Removed.  Code Status:  full code Last date of multidisciplinary goals of care discussion [Updated 6/11  Lynnell Catalan, MD [facility] ICU Physician [facility]  Pager: [phone] Or Epic Secure Chat After hours: [phone].  07/04/2021, 9:49 AM

## 2021-07-04 NOTE — Assessment & Plan Note (Signed)
Noted on Doppler. Likely accounts for fever and tachycardia.   -Currently on room air and hemodynamically stable so no need for intervention beyond anticoagulation.  - Currently on Lovenox at treatment dose, would continue this until decision made regarding PEG tube if not eating sufficiently. Can then convert to Eliquis and treat for minimum of 3 months or longer if she remains immobile.

## 2021-07-04 NOTE — Plan of Care (Signed)
  Problem: Education: Goal: Knowledge of disease or condition will improve Outcome: Progressing Goal: Knowledge of secondary prevention will improve (SELECT ALL) Outcome: Progressing   Problem: Coping: Goal: Will verbalize positive feelings about self Outcome: Progressing   Problem: Health Behavior/Discharge Planning: Goal: Ability to manage health-related needs will improve Outcome: Progressing   Problem: Self-Care: Goal: Ability to participate in self-care as condition permits will improve Outcome: Progressing   Problem: Spontaneous Subarachnoid Hemorrhage Tissue Perfusion: Goal: Complications of Spontaneous Subarachnoid Hemorrhage will be minimized Outcome: Progressing

## 2021-07-04 NOTE — Progress Notes (Signed)
Providing Compassionate, Quality Care - Together   Subjective: Nursing staff report no new issues overnight.  Objective: Vital signs in last 24 hours: Temp:  [97.8 F (36.6 C)-99.2 F (37.3 C)] 98.8 F (37.1 C) (06/11 0755) Pulse Rate:  [75-120] 102 (06/11 0900) Resp:  [14-27] 19 (06/11 0600) BP: (100-134)/(67-104) 129/84 (06/11 1000) SpO2:  [92 %-100 %] 99 % (06/11 0900) Weight:  [100 kg] 100 kg (06/11 0300)  Intake/Output from previous day: 06/10 0701 - 06/11 0700 In: 966.7 [P.O.:340; NG/GT:626.7] Out: 1700 [Urine:1700] Intake/Output this shift: No intake/output data recorded.  Alert PERRLA Would not speak during exam Will not follow commands Right hemiparesis VSS  Lab Results: Recent Labs    07/02/21 1543  WBC 13.8*  HGB 11.7*  HCT 38.3  PLT 356   BMET Recent Labs    07/02/21 1543  NA 143  K 4.5  CL 112*  CO2 22  GLUCOSE 104*  BUN 39*  CREATININE 0.79  CALCIUM 9.2    Studies/Results: VAS US LOWER EXTREMITY VENOUS (DVT)  Result Date: 07/04/2021  Lower Venous DVT Study Patient Name:  Yolanda LovelessERESA Boone  Date of Exam:   07/03/2021 Medical Rec #: [id number]      Accession #:    [id number] Date of Birth: 08-Dec-1960     Patient Gender: F Patient Age:   [age] years [facility] Procedure:      VAS US LOWER EXTREMITY VENOUS (DVT) Referring Phys: Lisbeth RenshawNEELESH NUNDKUMAR --------------------------------------------------------------------------------  Indications: Tachycardia, fever lasting for less than 1 day.  Risk Factors: SAH. Comparison Study: No prior study on file Performing Technologist: Sherren Kernsandace Kanady RVS  Examination Guidelines: A complete evaluation includes B-mode imaging, spectral Doppler, color Doppler, and power Doppler as needed of all accessible portions of each vessel. Bilateral testing is considered an integral part of a complete examination. Limited examinations for reoccurring indications may be performed as noted. The reflux portion  of the exam is performed with the patient in reverse Trendelenburg.  +---------+---------------+---------+-----------+----------+--------------+ RIGHT    CompressibilityPhasicitySpontaneityPropertiesThrombus Aging +---------+---------------+---------+-----------+----------+--------------+ CFV      Full           Yes      Yes                                 +---------+---------------+---------+-----------+----------+--------------+ SFJ      Full                                                        +---------+---------------+---------+-----------+----------+--------------+ FV Prox  Full                                                        +---------+---------------+---------+-----------+----------+--------------+ FV Mid   Full                                                        +---------+---------------+---------+-----------+----------+--------------+ FV DistalFull                                                        +---------+---------------+---------+-----------+----------+--------------+  PFV      Full                                                        +---------+---------------+---------+-----------+----------+--------------+ POP      Full           Yes      Yes                                 +---------+---------------+---------+-----------+----------+--------------+ PTV      Full                                                        +---------+---------------+---------+-----------+----------+--------------+ PERO     None                                         Acute          +---------+---------------+---------+-----------+----------+--------------+   +---------+---------------+---------+-----------+----------+--------------+ LEFT     CompressibilityPhasicitySpontaneityPropertiesThrombus Aging +---------+---------------+---------+-----------+----------+--------------+ CFV      Full           Yes      Yes                                  +---------+---------------+---------+-----------+----------+--------------+ SFJ      Full                                                        +---------+---------------+---------+-----------+----------+--------------+ FV Prox  Full                                                        +---------+---------------+---------+-----------+----------+--------------+ FV Mid   Full                                                        +---------+---------------+---------+-----------+----------+--------------+ FV DistalNone           No       No                   Acute          +---------+---------------+---------+-----------+----------+--------------+ PFV      Full                                                        +---------+---------------+---------+-----------+----------+--------------+  POP      None           No       No                   Acute          +---------+---------------+---------+-----------+----------+--------------+ PTV      None                                         Acute          +---------+---------------+---------+-----------+----------+--------------+ PERO     None                                         Acute          +---------+---------------+---------+-----------+----------+--------------+     Summary: RIGHT: - Findings consistent with acute deep vein thrombosis involving the right peroneal veins.  LEFT: - Findings consistent with acute deep vein thrombosis involving the left popliteal vein, left distal femoral vein, left posterior tibial veins, and left peroneal veins.  *See table(s) above for measurements and observations. Electronically signed by Lemar Livings MD on 07/04/2021 at 10:07:59 AM.    Final    CT ABDOMEN WO CONTRAST  Result Date: 07/02/2021 CLINICAL DATA:  Nausea/vomiting Concern for SBO EXAM: CT ABDOMEN WITHOUT CONTRAST TECHNIQUE: Multidetector CT imaging of the abdomen was performed following the  standard protocol without IV contrast. RADIATION DOSE REDUCTION: This exam was performed according to the departmental dose-optimization program which includes automated exposure control, adjustment of the mA and/or kV according to patient size and/or use of iterative reconstruction technique. COMPARISON:  None Available. FINDINGS: Lower chest: No pleural or pericardial effusion. Hepatobiliary: No focal liver abnormality is seen. No gallstones, gallbladder wall thickening, or biliary dilatation. Pancreas: Unremarkable. No pancreatic ductal dilatation or surrounding inflammatory changes. Spleen: Normal in size without focal abnormality. Adrenals/Urinary Tract: No adrenal mass. No urolithiasis or hydronephrosis. 8.2 cm 6 HU exophytic cystic lesion from the mid right kidney; no follow-up imaging recommended. Stomach/Bowel: Weighted tip feeding tube tube extends into the decompressed stomach. Changes of gastric bypass surgery. Visualized small bowel decompressed. Visualized colon contains moderate fecal material, without dilatation or wall thickening. Vascular/Lymphatic: Mild aortoiliac calcified atheromatous plaque without aneurysm. No abdominal adenopathy. Other: No abdominal ascites.  No free air. Musculoskeletal: Mild spondylitic change in the lower thoracic and upper lumbar spine. No acute findings. IMPRESSION: 1. No acute findings.  No evidence of small-bowel obstruction. 2.  Aortic Atherosclerosis (ICD10-170.0). Electronically Signed   By: Corlis Leak M.D.   On: 07/02/2021 17:07   VAS [place] TRANSCRANIAL DOPPLER  Result Date: 07/02/2021  Transcranial Doppler Patient Name:  MINTIE WITHERINGTON  Date of Exam:   07/02/2021 Medical Rec #: [id number]      Accession #:    [id number] Date of Birth: 05-13-1960     Patient Gender: F Patient Age:   11 years Exam Location:  [facility] Procedure:      VAS [place] TRANSCRANIAL DOPPLER Referring Phys: Ivin Booty MCDANIEL  --------------------------------------------------------------------------------  Indications: Subarachnoid hemorrhage. Limitations for diagnostic windows: Unable to insonate right transtemporal window. Unable to insonate left transtemporal window. Comparison Study: Previous exam on 06/30/21 Performing Technologist: Ernestene Mention RVT, RDMS  Examination Guidelines: A complete evaluation includes B-mode imaging, spectral Doppler, color Doppler,  and power Doppler as needed of all accessible portions of each vessel. Bilateral testing is considered an integral part of a complete examination. Limited examinations for reoccurring indications may be performed as noted.  +----------+-------------+----------+-----------+-------------+ RIGHT TCD Right VM (cm)Depth (cm)Pulsatility   Comment    +----------+-------------+----------+-----------+-------------+ MCA                                         not insonated +----------+-------------+----------+-----------+-------------+ ACA                                         not insonated +----------+-------------+----------+-----------+-------------+ Term ICA                                    not insonated +----------+-------------+----------+-----------+-------------+ PCA                                         not insonated +----------+-------------+----------+-----------+-------------+ Opthalmic     13.00                                       +----------+-------------+----------+-----------+-------------+ ICA siphon                                  not insonated +----------+-------------+----------+-----------+-------------+ Vertebral    -36.00                                       +----------+-------------+----------+-----------+-------------+ Distal ICA    28.00                                       +----------+-------------+----------+-----------+-------------+  +----------+------------+----------+-----------+-------------+  LEFT TCD  Left VM (cm)Depth (cm)Pulsatility   Comment    +----------+------------+----------+-----------+-------------+ MCA                                        not insonated +----------+------------+----------+-----------+-------------+ ACA                                        not insonated +----------+------------+----------+-----------+-------------+ Term ICA                                   not insonated +----------+------------+----------+-----------+-------------+ PCA                                        not insonated +----------+------------+----------+-----------+-------------+ Opthalmic  not insonated +----------+------------+----------+-----------+-------------+ ICA siphon   27.00                                       +----------+------------+----------+-----------+-------------+ Vertebral    -51.00                                      +----------+------------+----------+-----------+-------------+ Distal ICA   28.00                                       +----------+------------+----------+-----------+-------------+  +------------+------+-------+             VM cm Comment +------------+------+-------+ Prox Basilar-21.00        +------------+------+-------+ Dist Basilar-29.00        +------------+------+-------+    Preliminary    DG CHEST PORT 1 VIEW  Result Date: 07/02/2021 CLINICAL DATA:  Shortness of breath, abdominal distension EXAM: PORTABLE CHEST 1 VIEW COMPARISON:  Radiograph 06/20/2021 FINDINGS: Unchanged cardiomediastinal silhouette. Low lung volumes. Left medial basilar opacity. No large pleural effusion. No pneumothorax. No acute osseous abnormality. There is there is a feeding tube with tip overlying the stomach. There are multiple dilated loops of small bowel and probably splenic flexure, as seen on separately dictated abdominal radiograph. IMPRESSION: Low lung volumes. Left medial basilar  opacity, favored to be atelectasis. Dilated loops of bowel in the abdomen, as noted in separately dictated abdominal radiograph. Electronically Signed   By: Caprice Renshaw M.D.   On: 07/02/2021 14:36   DG Abd Portable 1V  Result Date: 07/02/2021 CLINICAL DATA:  Abdominal distention, shortness of breath EXAM: PORTABLE ABDOMEN - 1 VIEW COMPARISON:  06/22/2021 FINDINGS: Tip of enteric tube is seen in the region of medial aspect of fundus of the stomach. Distal course of enteric tube appears to be coiled. There is dilation of small-bowel loops in the visualized portions of abdomen measuring up to 5.1 cm in diameter. Stomach is not distended. Gas and stool are present in colon. Pelvis is not included in its entirety limiting evaluation of rectosigmoid region. IMPRESSION: Tip of enteric tube is seen in the medial aspect of fundus of the stomach. There is dilation of small-bowel loops suggesting ileus or partial obstruction. Electronically Signed   By: Ernie Avena M.D.   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 Coil embolization on 06/20/2021. Extubated 06/23/2021. Tachycardia and abdominal distension on 07/02/2021 appears resolved. Venous dopplers performed 07/03/2021 showed BLE DVTs. Patient started on Lovenox.   LOS: 14 days   -Will transfer out of unit today. -Continue supportive efforts. -Recommendation for SNF at discharge.    Val Eagle, DNP, AGNP-C Nurse Practitioner  [facility] [address], [place], [place] [address] P: [phone]    F: [phone]  07/04/2021, 10:55 AM

## 2021-07-05 ENCOUNTER — Inpatient Hospital Stay (HOSPITAL_COMMUNITY): Payer: BC Managed Care – PPO

## 2021-07-05 LAB — GLUCOSE, CAPILLARY
Glucose-Capillary: 107 mg/dL — ABNORMAL HIGH (ref 70–99)
Glucose-Capillary: 108 mg/dL — ABNORMAL HIGH (ref 70–99)
Glucose-Capillary: 112 mg/dL — ABNORMAL HIGH (ref 70–99)
Glucose-Capillary: 88 mg/dL (ref 70–99)
Glucose-Capillary: 92 mg/dL (ref 70–99)

## 2021-07-05 LAB — CBC
HCT: 36.7 % (ref 36.0–46.0)
Hemoglobin: 11.2 g/dL — ABNORMAL LOW (ref 12.0–15.0)
MCH: 28 pg (ref 26.0–34.0)
MCHC: 30.5 g/dL (ref 30.0–36.0)
MCV: 91.8 fL (ref 80.0–100.0)
Platelets: 474 10*3/uL — ABNORMAL HIGH (ref 150–400)
RBC: 4 MIL/uL (ref 3.87–5.11)
RDW: 12.8 % (ref 11.5–15.5)
WBC: 10.9 10*3/uL — ABNORMAL HIGH (ref 4.0–10.5)
nRBC: 0 % (ref 0.0–0.2)

## 2021-07-05 NOTE — Progress Notes (Signed)
Yolanda Hatchet, RN on 4N regarding plan to continue or d/c TCD studies. Per Charlynne Pander, reached out to providers, still awaiting response. Patient transferred from unit in the interim.   Jean Rosenthal, RDMS, RVT

## 2021-07-05 NOTE — Progress Notes (Signed)
  NEUROSURGERY PROGRESS NOTE   Pt seen and examined. No issues overnight.   EXAM: Temp:  [97.6 F (36.4 C)-99.7 F (37.6 C)] 98 F (36.7 C) (06/12 1448) Pulse Rate:  [81-109] 88 (06/12 1448) Resp:  [11-28] 20 (06/12 1448) BP: (96-130)/(65-100) 114/80 (06/12 1448) SpO2:  [95 %-100 %] 100 % (06/12 1448) Weight:  [100.7 kg] 100.7 kg (06/12 0500) Intake/Output      06/11 0701 06/12 0700 06/12 0701 06/13 0700   P.O.     NG/GT 622.5 130   Total Intake(mL/kg) 622.5 (6.2) 130 (1.3)   Urine (mL/kg/hr) 1200 (0.5)    Stool 0    Total Output 1200    Net -577.5 +130        Urine Occurrence 2 x    Stool Occurrence 1 x     Awake, alert Non-verbal Face symmetric Not-following commands Moves LUE/LLE purposefully, Flicker of movement noted right hand Abd soft, NT  LABS: Lab Results  Component Value Date   CREATININE 0.79 07/02/2021   BUN 39 (H) 07/02/2021   NA 143 07/02/2021   K 4.5 07/02/2021   CL 112 (H) 07/02/2021   CO2 22 07/02/2021   Lab Results  Component Value Date   WBC 10.9 (H) 07/05/2021   HGB 11.2 (L) 07/05/2021   HCT 36.7 07/05/2021   MCV 91.8 07/05/2021   PLT 474 (H) 07/05/2021      IMPRESSION: - 61 y.o. female SAH d#15 s/p coiling ruptured RMCA aneurysm. Right hemiparesis subtly improved with some distal RUE movement, able to mouth and nod to questions - Inadequate oral intake, but appears to be eating more. Calorie count in progress - Bilateral DVT - Type B Aortic dissection  PLAN: - Cont supportive care - Cont PT/OT - CIR v SNF dispo - Cont Nimotop to finish 21 days - Lovenox for DVT - Cont TF and monitor caloric intake    Lisbeth Renshaw, MD [facility]

## 2021-07-05 NOTE — Progress Notes (Addendum)
Nutrition Follow-up  DOCUMENTATION CODES:   Obesity unspecified  INTERVENTION:   48 hour calorie count  Took food preferences from son; entered into [employer] system.   Hormel shake and magic cup with all meals   Ensure Enlive po BID, each supplement provides 350 kcal and 20 grams of protein.  Osmolite 1.5 @ 50 ml/hr x 12 hours (600 ml) Run from 1800 - 0600 45 ml ProSource TF BID   Provides: 980 kcal, 60 grams protein, and 456 ml free water.    MVI with minerals BID to meet bariatric guidelines Calcium citrate with vitamin D 500 mg TID to meet bariatric guidelines   NUTRITION DIAGNOSIS:   Inadequate oral intake related to dysphagia as evidenced by meal completion < 50%. Ongoing.   GOAL:   Patient will meet greater than or equal to 90% of their needs Not met.   MONITOR:   PO intake, Diet advancement, TF tolerance  REASON FOR ASSESSMENT:   Consult Enteral/tube feeding initiation and management  ASSESSMENT:   Pt with PMH of HTN, thoracic aneurysm, gastric bypass, and grave's dz who was admitted with [facility] s/p coiling of ruptured R MCA aneurysm.    Pt discussed during ICU rounds and with RN and MD.  Spoke with RN and son who is at bedside. Per RN pt does not consistently track what is going on around her. Meal completion has been much less 0-25%.  Per son pt has not been drinking the supplements we had ordered; he does report she prefers chocolate; will adjust orders for this. We discussed that the cortrak tube is a temporary tube that she can go to CIR with but not SNF. If she is not consistently meeting her nutrition needs with PO that PEG is recommended prior to d/c to SNF.  Took additional food preferences; pt does not like vanilla supplements and does not like the thickened sweet tea.   5/30 s/p cortrak placement, per xray tip in gastric body, question if this is the gastric pouch nothing noted about gastric bypass in xray.  6/1 diet advanced to Dysphagia 1  with thin liquids; ate 100% of dinner 6/2 aspiration event, NPO and TF held 6/3 TF restarted 6/6 s/p MBS started on Dysphagia diet with nectar thickened liquids   Medications reviewed and include: oscal with D (500-5) TID, MVI with minerals, nimotop  Labs reviewed:  Vitamin D: 22.88 CBG: 89-114   Diet Order:   Diet Order             DIET - DYS 1 Room service appropriate? No; Fluid consistency: Nectar Thick  Diet effective now                   EDUCATION NEEDS:   No education needs have been identified at this time  Skin:  Skin Assessment: Reviewed RN Assessment  Last BM:  1400 ml via rectal tube  Height:   Ht Readings from Last 1 Encounters:  06/20/21 $RemoveB'5\' 7"'pAozobDo$  (1.702 m)    Weight:   Wt Readings from Last 1 Encounters:  07/05/21 100.7 kg    BMI:  Body mass index is 34.77 kg/m.  Estimated Nutritional Needs:   Kcal:  1800-2000  Protein:  95-120 grams  Fluid:  >1.8 L/day  [name]., RD, LDN, CNSC See AMiON for contact information

## 2021-07-05 NOTE — Progress Notes (Signed)
Inpatient Rehab Admissions Coordinator:    CIR continues to follow. Per daughter request, will see how she does with next therapy session before making decision on whether to pursue CIR or SNF for this pt. Await updated therapy notes  Megan Salon, MS, CCC-SLP Rehab Admissions Coordinator  [phone] (celll) [phone] (office)

## 2021-07-05 NOTE — TOC Progression Note (Signed)
Transition of Care [facility]) - Progression Note    Patient Details  Name: Yolanda Boone MRN: [id number] Date of Birth: Mar 18, 1960  Transition of Care [facility]) CM/SW Contact  Mearl Latin, LCSW Phone Number: 07/05/2021, 11:29 AM  Clinical Narrative:    CSW following for SNF placement if CIR is unable to accept patient. Can fax out SNF referrals once cortrak is removed.    Expected Discharge Plan: IP Rehab Facility Barriers to Discharge: Continued Medical Work up, English as a second language teacher  Expected Discharge Plan and Services Expected Discharge Plan: IP Rehab Facility In-house Referral: Clinical Social Work     Living arrangements for the past 2 months: Single Family Home                                       Social Determinants of Health (SDOH) Interventions    Readmission Risk Interventions     No data to display

## 2021-07-05 NOTE — Progress Notes (Signed)
Patient SBP 114, orders for SBP 120-180. Patient had Nimodipine ordered with instructions to call MD if SBP and MAP under goal. [facility] who had on call call me back with verbal order to administer.

## 2021-07-06 ENCOUNTER — Other Ambulatory Visit (HOSPITAL_COMMUNITY): Payer: BC Managed Care – PPO

## 2021-07-06 LAB — GLUCOSE, CAPILLARY
Glucose-Capillary: 127 mg/dL — ABNORMAL HIGH (ref 70–99)
Glucose-Capillary: 137 mg/dL — ABNORMAL HIGH (ref 70–99)
Glucose-Capillary: 74 mg/dL (ref 70–99)
Glucose-Capillary: 112 mg/dL — ABNORMAL HIGH (ref 70–99)
Glucose-Capillary: 93 mg/dL (ref 70–99)
Glucose-Capillary: 124 mg/dL — ABNORMAL HIGH (ref 70–99)

## 2021-07-06 NOTE — Progress Notes (Signed)
  NEUROSURGERY PROGRESS NOTE   Pt seen and examined. No issues overnight.   EXAM: Temp:  [97.7 F (36.5 C)-99.4 F (37.4 C)] 99.4 F (37.4 C) (06/13 1514) Pulse Rate:  [80-108] 108 (06/13 1514) Resp:  [16-20] 18 (06/13 1514) BP: (112-146)/(63-92) 128/63 (06/13 1514) SpO2:  [94 %-100 %] 100 % (06/13 1514) Weight:  [99.1 kg] 99.1 kg (06/13 0448) Intake/Output      06/12 0701 06/13 0700 06/13 0701 06/14 0700   P.O.  320   NG/GT 130 200   Total Intake(mL/kg) 130 (1.3) 520 (5.2)   Urine (mL/kg/hr)  900 (1)   Stool     Total Output  900   Net +130 -380         Awake, alert Non-verbal Face symmetric Not-following commands Moves LUE/LLE purposefully, Flicker of movement noted right hand Abd soft, NT  LABS: Lab Results  Component Value Date   CREATININE 0.79 07/02/2021   BUN 39 (H) 07/02/2021   NA 143 07/02/2021   K 4.5 07/02/2021   CL 112 (H) 07/02/2021   CO2 22 07/02/2021   Lab Results  Component Value Date   WBC 10.9 (H) 07/05/2021   HGB 11.2 (L) 07/05/2021   HCT 36.7 07/05/2021   MCV 91.8 07/05/2021   PLT 474 (H) 07/05/2021      IMPRESSION: - 61 y.o. female [place] d#16 s/p coiling ruptured RMCA aneurysm. Right hemiparesis subtly improved with some distal RUE movement, able to mouth and nod to questions - Inadequate oral intake, but appears to be eating more. Calorie count in progress - Bilateral DVT - Type B Aortic dissection  PLAN: - Cont supportive care - Cont PT/OT - after eval today, plan on SNF placement - Cont Nimotop to finish 21 days - Lovenox for DVT - Cont TF and monitor caloric intake    Consuella Lose, MD [facility]

## 2021-07-06 NOTE — Progress Notes (Signed)
Calorie Count Note  48-hour calorie count ordered.  Diet: DYS 1 with nectar thick liquids Supplements: Hormel Shake TID, Magic Cup TID, Ensure Enlive BID  Estimated Nutritional Needs:  Kcal:  1800-2000 Protein:  95-120 grams Fluid:  >1.8 L/day  Reviewed meal tickets from the previous 24 hours, none saved and documented in envelope aside from breakfast this AM. Needs from meal are below. Ensure is documented as given, but no documentation of consumption in envelope. Will follow-up with RN on amount consumed and addend kcal count as needed.  6/13 Breakfast: 158 kcal, 5g of protein Supplements: 25% of magic cup this AM, 58kcal, 2g of protein  Total intake: 216 kcal (12% of minimum estimated needs)  7 protein (7% of minimum estimated needs)  NUTRITION DIAGNOSIS:  Inadequate oral intake related to dysphagia as evidenced by meal completion < 50%.    GOAL:  Patient will meet greater than or equal to 90% of their needs  Intervention:  Continue current diet as ordered Continue Kcal count to determine if pt can meet needs orally or if PEG will be needed Continue Ensure Enlive BID and Hormel Shake/Magic Cup with meals  Greig Castilla, RD, LDN Clinical Dietitian RD pager # available in AMION  After hours/weekend pager # available in [facility]

## 2021-07-06 NOTE — Progress Notes (Signed)
Inpatient Rehab Admissions Coordinator:   PT. To see pt. Today and will discuss Pt.'s ability to tolerate CIR afterwards. If unable to tolerate, Pt. Likely to need SNF.  Clemens Catholic, [place], [place] Admissions Coordinator  [phone] ([place]) [phone] (office)

## 2021-07-06 NOTE — Progress Notes (Signed)
Physical Therapy Treatment Patient Details Name: Yolanda Boone MRN: [id number] DOB: Nov 30, 1960 Today's Date: 07/06/2021   History of Present Illness 61 y.o. female presents to [facility] hospital on 06/20/2021 after being found down by EMS. Pt with L gaze and posturing, hypoxic to 60s requiring emergent intubation 06/20/21-06/23/21 . CTH and CTA reveal large SAH with midline shift, concerning for L MCA aneurysm rupture. 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 No PMH on file.    PT Comments    Pt was seen for mobility to side of bed and to move R side with assistance.  Pt has open eyes and looks interested in therapy initially but once moving begins to resist with LUE and trunk.  Pt is able to be assisted to side of bed, and alternates sitting with PT vs resisting the posture.  Pt may be feeling insecure to try this given the gradual increase in resistance to side of bed.  Pt had prafo boots applied to both feet, which were her WB surface sitting.  Will work on moving toward a chair with lift for safety, and can progress the standing which was possible on last session.  Continue to encourage her to work on the challenges with sitting balance, on the transition to try to stand and to work on bed to chair transition.  Monitor ROM on ankles and R hip to avoid tightness in joints that will hinder future efforts at independence.  Recommending SNF care for her deficits, to allow more time for recovery and time for training and planning with her family.  Recommendations for follow up therapy are one component of a multi-disciplinary discharge planning process, led by the attending physician.  Recommendations may be updated based on patient status, additional functional criteria and insurance authorization.  Follow Up Recommendations  Skilled nursing-short term rehab (<3 hours/day)     Assistance Recommended at Discharge  Frequent or constant Supervision/Assistance  Patient can return home with the following Two people to help with walking and/or transfers;Two people to help with bathing/dressing/bathroom;Assistance with cooking/housework;Assistance with feeding;Direct supervision/assist for medications management;Direct supervision/assist for financial management;Assist for transportation;Help with stairs or ramp for entrance   Equipment Recommendations  Wheelchair (measurements PT);Wheelchair cushion (measurements PT);Hospital bed;Other (comment) (hoyer lift)    Recommendations for Other Services       Precautions / Restrictions Precautions Precautions: Fall Precaution Comments: cortrak Restrictions Weight Bearing Restrictions: No     Mobility  Bed Mobility Overal bed mobility: Needs Assistance Bed Mobility: Supine to Sit, Sit to Supine Rolling: Max assist   Supine to sit: Total assist, +2 for physical assistance, +2 for safety/equipment Sit to supine: Total assist, +2 for physical assistance, +2 for safety/equipment   General bed mobility comments: resisting with LUE to sitting and getting up to side of bed, with alternately resisted and more passive sitting    Transfers                   General transfer comment: unable to stand due to strong trunk resistance to sitting    Ambulation/Gait               General Gait Details: unable   Stairs             Wheelchair Mobility    Modified Rankin (Stroke Patients Only)       Balance   Sitting-balance support: Single extremity supported, Feet supported Sitting balance-Leahy Scale: Poor  Cognition Arousal/Alertness: Awake/alert, Lethargic Behavior During Therapy: Flat affect Overall Cognitive Status: Difficult to assess Area of Impairment: Following commands, Attention, Awareness                   Current Attention Level: Selective   Following  Commands: Follows one step commands inconsistently Safety/Judgement: Decreased awareness of safety, Decreased awareness of deficits Awareness: Intellectual Problem Solving: Slow processing, Decreased initiation, Requires verbal cues, Requires tactile cues General Comments: at times eyes are open, at times closed and has alternated interested to unresponsive to requests to move.  Has active resistance to sitting on side of bed, alternating with moments of sitting still        Exercises General Exercises - Upper Extremity Shoulder Flexion: PROM, Right Shoulder ABduction: PROM, Right Elbow Flexion: PROM, 10 reps, Right Elbow Extension: PROM, Right General Exercises - Lower Extremity Quad Sets: PROM, 10 reps Heel Slides: PROM, 10 reps Hip ABduction/ADduction: PROM, 10 reps Hip Flexion/Marching: PROM, 10 reps    General Comments General comments (skin integrity, edema, etc.): pt was given her PRAFO boots to sit on side of bed, with pt being alternately willing to help and then resisting.  Has automatic reaction to getting up to side of bed, with LUE pushing toward a return to [facility] with trunk and head      Pertinent Vitals/Pain Pain Assessment Pain Assessment: Faces Faces Pain Scale: No hurt    Home Living                          Prior Function            PT Goals (current goals can now be found in the care plan section)      Frequency    Min 3X/week      PT Plan Current plan remains appropriate    Co-evaluation              AM-PAC PT "6 Clicks" Mobility   Outcome Measure  Help needed turning from your back to your side while in a flat bed without using bedrails?: Total Help needed moving from lying on your back to sitting on the side of a flat bed without using bedrails?: Total Help needed moving to and from a bed to a chair (including a wheelchair)?: Total Help needed standing up from a chair using your arms (e.g., wheelchair or bedside chair)?:  Total Help needed to walk in hospital room?: Total Help needed climbing 3-5 steps with a railing? : Total 6 Click Score: 6    End of Session   Activity Tolerance: Patient tolerated treatment well;Patient limited by fatigue Patient left: in bed;with call bell/phone within reach;with bed alarm set;with family/visitor present Nurse Communication: Mobility status;Need for lift equipment PT Visit Diagnosis: Other abnormalities of gait and mobility (R26.89);Muscle weakness (generalized) (M62.81);Hemiplegia and hemiparesis Hemiplegia - Right/Left: Right Hemiplegia - dominant/non-dominant: Dominant Hemiplegia - caused by: Nontraumatic SAH     Time: 1220-1249 PT Time Calculation (min) (ACUTE ONLY): 29 min  Charges:  $Therapeutic Exercise: 8-22 mins $Therapeutic Activity: 8-22 mins    Ivar Drape 07/06/2021, 1:46 PM  Samul Dada, PT PhD Acute Rehab Dept. Number: [facility] [id number] and [facility] [phone]

## 2021-07-06 NOTE — Progress Notes (Signed)
Inpatient Rehab Admissions Coordinator:    I spoke with PT who saw Pt. This AM and reports Pt. Remains unable to tolerate intensity of CIR. I discussed with pt.'s daughter Kathlen Mody and she states understanding that Pt. Is no longer a CIR candidate. She is interested in SNF in the [place] area.  I will let TOC know.   Megan Salon, MS, CCC-SLP Rehab Admissions Coordinator  [phone] (celll) [phone] (office)

## 2021-07-06 NOTE — Progress Notes (Signed)
Speech Language Pathology Treatment: Cognitive-Linquistic;Dysphagia  Patient Details Name: Adysson Revelle MRN: [id number] DOB: [birth year] Today's Date: 07/06/2021 Time: 8938-1017 SLP Time Calculation (min) (ACUTE ONLY): 15 min  Assessment / Plan / Recommendation Clinical Impression  Patient seen by SLP for skilled treatment session focused on cognitive function and swallow function. Per NT who was in room when SLP arrived, patient ate well at breakfast and lunch today. (She continues with Coretrak feeding tube and calorie count is in place at this time.) Patient was awake and alert but required moderate frequency of verbal and tactile cues to direct and redirect her attention. No verbalizations observed and only one reflexive vocalization when yawning. Patient followed basic one-step commands to open mouth and stick out tongue. She was able to hold drink box of Ensure in hand to give self sips but required SLP cues to direct straw into mouth. When she was done drinking, she then didn't exhibit any control of drink and brought it down forcefully. SLP presented wet toothbrush (no toothpaste) and patient did put in mouth but did not demonstrate ability to brush teeth and instead would hold toothbrush in mouth. She was resistant to SLP providing HOH assist to try to initiate. Patient continues to progress slowly and per review of recent notes, she will not qualify for inpatient rehab as she is not demonstrating ability to tolerate that amount of therapy and discharge recommendations have been changed to SNF. SLP will continue to follow patient acutely for aphasia, cognition, swallow function.    HPI HPI: Pt is a 61 y.o. female who presented to the ED on 06/20/21 after being found down by EMS. Left gaze, posturing, and hypoxia the 60's noted in the ED and pt intubated. ETT 5/28-5/31 CT head and CTA revealed large SAH with midline shift concerning for left MCA aneurysm rupture. Pt underwent coil embolization of  L MCA aneurysm 5/29 with incidental findings of bilateral PCOM and right supraclinoid ICA aneurysms. No PMH in chart.      SLP Plan  Continue with current plan of care      Recommendations for follow up therapy are one component of a multi-disciplinary discharge planning process, led by the attending physician.  Recommendations may be updated based on patient status, additional functional criteria and insurance authorization.    Recommendations  Diet recommendations: Dysphagia 1 (puree);Nectar-thick liquid Liquids provided via: Cup;Straw Medication Administration: Crushed with puree Supervision: Staff to assist with self feeding;Full supervision/cueing for compensatory strategies Compensations: Slow rate;Small sips/bites Postural Changes and/or Swallow Maneuvers: Seated upright 90 degrees                Oral Care Recommendations: Oral care BID;Staff/trained caregiver to provide oral care Follow Up Recommendations: Skilled nursing-short term rehab (<3 hours/day) Assistance recommended at discharge: Frequent or constant Supervision/Assistance SLP Visit Diagnosis: Dysphagia, oropharyngeal phase (R13.12) Plan: Continue with current plan of care         Angela Nevin, MA, CCC-SLP Speech Therapy

## 2021-07-07 ENCOUNTER — Other Ambulatory Visit (HOSPITAL_COMMUNITY): Payer: BC Managed Care – PPO

## 2021-07-07 LAB — GLUCOSE, CAPILLARY
Glucose-Capillary: 131 mg/dL — ABNORMAL HIGH (ref 70–99)
Glucose-Capillary: 95 mg/dL (ref 70–99)
Glucose-Capillary: 100 mg/dL — ABNORMAL HIGH (ref 70–99)
Glucose-Capillary: 115 mg/dL — ABNORMAL HIGH (ref 70–99)
Glucose-Capillary: 105 mg/dL — ABNORMAL HIGH (ref 70–99)
Glucose-Capillary: 117 mg/dL — ABNORMAL HIGH (ref 70–99)

## 2021-07-07 NOTE — TOC Progression Note (Addendum)
Transition of Care [facility]) - Progression Note    Patient Details  Name: Yolanda Boone MRN: [id number] Date of Birth: 1960/04/11  Transition of Care [facility] CM/SW Contact  Pollie Friar, RN Phone Number: 07/07/2021, 10:34 AM  Clinical Narrative:    CIR has determined patient will not be a candidate for their program. CM met with the patient and her son and the family is interested in SNF rehab in the [place] area. CM asked about family preference and they are going to talk as a group and provide CM/CSW with 3-4 choices in the [place] area later today.  Pt will need to either be tolerating a diet or have PEG for SNF rehab. Son is aware.  TOC following.  1600: CM spoke to patients daughter via phone. She doesn't have any facilities selected in the [place] area. She states she will do some research and provide CM with choices for SNF tomorrow. She is only available after 4 pm.    Expected Discharge Plan: [place] Barriers to Discharge: Continued Medical Work up  Expected Discharge Plan and Services Expected Discharge Plan: [place] In-house Referral: Clinical Social Work   Post Acute Care Choice: [place] Living arrangements for the past 2 months: Single Family Home                                       Social Determinants of Health (SDOH) Interventions    Readmission Risk Interventions     No data to display

## 2021-07-07 NOTE — Progress Notes (Signed)
Occupational Therapy Treatment Patient Details Name: Yolanda Boone MRN: [id number] DOB: Apr 24, 1960 Today's Date: 07/07/2021   History of present illness 61 y.o. female presents to [facility] hospital on 06/20/2021 after being found down by EMS. Pt with L gaze and posturing, hypoxic to 60s requiring emergent intubation 06/20/21-06/23/21 . CTH and CTA reveal large SAH with midline shift, concerning for L MCA aneurysm rupture. 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 No PMH on file.   OT comments  Raphaela is incrementally progressing with improvements noted in RUE function and ability to complete bimaual tasks. No attempts at verbalizing this session, pt and seemingly resisting some movements. Max A to roll and adjust in the bed. Pt tolerated chair position in the bed with no pushing noted. OT to continue to follow. D/c recommendation remains appropriate.     Recommendations for follow up therapy are one component of a multi-disciplinary discharge planning process, led by the attending physician.  Recommendations may be updated based on patient status, additional functional criteria and insurance authorization.    Follow Up Recommendations  Skilled nursing-short term rehab (<3 hours/day)    Assistance Recommended at Discharge Frequent or constant Supervision/Assistance  Patient can return home with the following  Two people to help with walking and/or transfers;Two people to help with bathing/dressing/bathroom;Assistance with cooking/housework;Assistance with feeding;Direct supervision/assist for medications management;Direct supervision/assist for financial management;Assist for transportation;Help with stairs or ramp for entrance   Equipment Recommendations  Other (comment)    Recommendations for Other Services Other (comment)    Precautions / Restrictions Precautions Precautions: Fall Precaution Comments:  cortrak Restrictions Weight Bearing Restrictions: No       Mobility Bed Mobility Overal bed mobility: Needs Assistance Bed Mobility: Rolling Rolling: Max assist         General bed mobility comments: max A for rolling and adjusting to top of bed - session complete in chair position in the bed    Transfers                         Balance                                           ADL either performed or assessed with clinical judgement   ADL Overall ADL's : Needs assistance/impaired     Grooming: Moderate assistance;Bed level                                 General ADL Comments: Pt does much better with automatic/purposeful tasks vs. broken down commands. Able to sequence through lotion application & reaching for utencils & adjusting coveres with BUEs    Extremity/Trunk Assessment Upper Extremity Assessment Upper Extremity Assessment: RUE deficits/detail;LUE deficits/detail RUE Deficits / Details: using functionally to assist with covers, reachign to grasp items and applying lotion. Slow and deliberate movements RUE Coordination: decreased fine motor;decreased gross motor LUE Deficits / Details: using purposefully for grooming and to grasp and release objects but not on command. Continues to push/extend with L when it is not occupie, and grasps bed rail LUE Sensation: decreased light touch;decreased proprioception LUE Coordination: decreased fine motor;decreased gross motor   Lower Extremity Assessment Lower Extremity Assessment: Defer to PT evaluation  Vision   Vision Assessment?: Vision impaired- to be further tested in functional context Additional Comments: L inattention   Perception Perception Perception: Not tested   Praxis Praxis Praxis: Not tested    Cognition Arousal/Alertness: Awake/alert, Lethargic Behavior During Therapy: Flat affect Overall Cognitive Status: Difficult to assess Area of  Impairment: Following commands, Attention, Awareness                   Current Attention Level: Selective   Following Commands: Follows one step commands inconsistently Safety/Judgement: Decreased awareness of safety, Decreased awareness of deficits Awareness: Intellectual Problem Solving: Slow processing, Decreased initiation, Requires verbal cues, Requires tactile cues General Comments: awake this session but seemingly resisting movements and command following. Followed <50% of simple commands              General Comments VSS on RA    Pertinent Vitals/ Pain       Pain Assessment Pain Assessment: Faces Faces Pain Scale: Hurts a little bit Pain Location: RLE with donning PRAFO boot Pain Descriptors / Indicators: Grimacing Pain Intervention(s): Monitored during session, Limited activity within patient's tolerance         Frequency  Min 2X/week        Progress Toward Goals  OT Goals(current goals can now be found in the care plan section)  Progress towards OT goals: Progressing toward goals  Acute Rehab OT Goals Patient Stated Goal: unable to state OT Goal Formulation: Patient unable to participate in goal setting Time For Goal Achievement: 07/07/21 Potential to Achieve Goals: Good ADL Goals Pt Will Perform Grooming: with max assist;sitting Additional ADL Goal #1: Pt will purposefully grasp an ADL object with LUE 50% of the time Additional ADL Goal #2: Pt will follow one step commands 50% of the time Additional ADL Goal #3: Pt will perform bed mobility at mod A +2 to come EOB in preparation for participation in ADL  Plan Frequency remains appropriate;Discharge plan needs to be updated       AM-PAC OT "6 Clicks" Daily Activity     Outcome Measure   Help from another person eating meals?: Total Help from another person taking care of personal grooming?: A Lot Help from another person toileting, which includes using toliet, bedpan, or urinal?: Total Help  from another person bathing (including washing, rinsing, drying)?: Total Help from another person to put on and taking off regular upper body clothing?: Total Help from another person to put on and taking off regular lower body clothing?: Total 6 Click Score: 7    End of Session    OT Visit Diagnosis: Other symptoms and signs involving cognitive function;Other symptoms and signs involving the nervous system (R29.898)   Activity Tolerance Patient tolerated treatment well   Patient Left in bed;with call bell/phone within reach;with bed alarm set   Nurse Communication Mobility status (food in room)        Time: 1700-1720 OT Time Calculation (min): 20 min  Charges: OT General Charges $OT Visit: 1 Visit OT Treatments $Therapeutic Activity: 8-22 mins    Jerome Otter A Caniya Tagle 07/07/2021, 5:42 PM

## 2021-07-07 NOTE — Progress Notes (Addendum)
Calorie Count Note  48-hour calorie count ordered.  Diet: DYS 1 with nectar thick liquids Supplements: Hormel Shake TID, Magic Cup TID, Ensure Enlive BID  Estimated Nutritional Needs:  Kcal:  1800-2000 Protein:  95-120 grams Fluid:  >1.8 L/day  Reviewed meal tickets from the previous 24 hours. Pt has better intake over the last 24 hours than she did on day 1. Meeting kcal needs, but not protein. Discussed with MD. Son not at bedside at the time of visit.  After speaking to the RN's caring for pt during calorie count, it seems that pt consistently eats well when her son or a family member is present and assisting with meals. Pt tends to ignore staff when son is not present and not acknowledge their presence. Pt will be discharged to a SNF and it is not feasible for family to be present with her at all meals. The majority of pt's kcal and protein are coming from supplements, not from her meals. Due to these factors, would recommend placement of G-tube to ensure that pt's nutrition needs can be met regardless of if family is present at meal times.   6/13 Lunch: 665 kcal, 18g of protein 6/13 Dinner: 437 kcal, 10g of protein 6/14 Breakfast: 520 kcal, 22g of protein Supplements: None documented  Total intake: 1622 kcal (90% of minimum estimated needs)  50 protein (53% of minimum estimated needs)  NUTRITION DIAGNOSIS:  Inadequate oral intake related to dysphagia as evidenced by meal completion < 50%.    GOAL:  Patient will meet greater than or equal to 90% of their needs  Intervention:  Continue current diet as ordered Continue Kcal count to determine if pt can meet needs orally or if PEG will be needed Continue Ensure Enlive BID and Hormel Shake/Magic Cup with meals  Ranell Patrick, RD, LDN Clinical Dietitian RD pager # available in [place]  After hours/weekend pager # available in [facility]

## 2021-07-07 NOTE — Progress Notes (Signed)
  NEUROSURGERY PROGRESS NOTE   Pt seen and examined. No issues overnight.   EXAM: Temp:  [97.7 F (36.5 C)-99.4 F (37.4 C)] 98.2 F (36.8 C) (06/14 0729) Pulse Rate:  [90-108] 93 (06/14 0729) Resp:  [14-20] 14 (06/14 0729) BP: (104-128)/(63-85) 116/85 (06/14 0729) SpO2:  [98 %-100 %] 100 % (06/14 0729) Intake/Output      06/13 0701 06/14 0700 06/14 0701 06/15 0700   P.O. 320    NG/GT 1050.8    Total Intake(mL/kg) 1370.8 (13.8)    Urine (mL/kg/hr) 900 (0.4)    Total Output 900    Net +470.8          Awake, alert Non-verbal Face symmetric Not-following commands Moves LUE/LLE purposefully, Flicker of movement noted right hand Abd soft, NT  LABS: Lab Results  Component Value Date   CREATININE 0.79 07/02/2021   BUN 39 (H) 07/02/2021   NA 143 07/02/2021   K 4.5 07/02/2021   CL 112 (H) 07/02/2021   CO2 22 07/02/2021   Lab Results  Component Value Date   WBC 10.9 (H) 07/05/2021   HGB 11.2 (L) 07/05/2021   HCT 36.7 07/05/2021   MCV 91.8 07/05/2021   PLT 474 (H) 07/05/2021      IMPRESSION: - 61 y.o. female SAH d#16 s/p coiling ruptured RMCA aneurysm. Right hemiparesis subtly improved with some distal RUE movement, able to mouth and nod to questions - Inadequate oral intake, but appears to be eating more. Calorie count in progress - Bilateral DVT - Type B Aortic dissection - Multiple unruptured intracranial aneurysms  PLAN: - Cont supportive care - Cont PT/OT - plan on SNF placement - Cont Nimotop to finish 21 days - Lovenox for DVT - Cont TF and monitor caloric intake - Appears to have poor PO intake thus far, suspect she likely will need PEG but await final calorie count     Lisbeth Renshaw, MD [facility]

## 2021-07-07 NOTE — Plan of Care (Signed)
Pt has been following some commands. Family has been visiting. She has said "ouch" when we moved her right leg. Problem: Education: Goal: Knowledge of General Education information will improve Description: Including pain rating scale, medication(s)/side effects and non-pharmacologic comfort measures 07/07/2021 2041 by Leodis Sias, RN Outcome: Progressing 07/07/2021 2041 by Leodis Sias, RN Outcome: Progressing   Problem: Health Behavior/Discharge Planning: Goal: Ability to manage health-related needs will improve 07/07/2021 2041 by Leodis Sias, RN Outcome: Progressing 07/07/2021 2041 by Leodis Sias, RN Outcome: Progressing   Problem: Clinical Measurements: Goal: Ability to maintain clinical measurements within normal limits will improve 07/07/2021 2041 by Leodis Sias, RN Outcome: Progressing 07/07/2021 2041 by Leodis Sias, RN Outcome: Progressing Goal: Will remain free from infection 07/07/2021 2041 by Leodis Sias, RN Outcome: Progressing 07/07/2021 2041 by Leodis Sias, RN Outcome: Progressing Goal: Diagnostic test results will improve 07/07/2021 2041 by Leodis Sias, RN Outcome: Progressing 07/07/2021 2041 by Leodis Sias, RN Outcome: Progressing Goal: Respiratory complications will improve 07/07/2021 2041 by Leodis Sias, RN Outcome: Progressing 07/07/2021 2041 by Leodis Sias, RN Outcome: Progressing Goal: Cardiovascular complication will be avoided 07/07/2021 2041 by Leodis Sias, RN Outcome: Progressing 07/07/2021 2041 by Leodis Sias, RN Outcome: Progressing   Problem: Activity: Goal: Risk for activity intolerance will decrease 07/07/2021 2041 by Leodis Sias, RN Outcome: Progressing 07/07/2021 2041 by Leodis Sias, RN Outcome: Progressing   Problem: Nutrition: Goal: Adequate nutrition will be maintained 07/07/2021 2041 by Leodis Sias, RN Outcome: Progressing 07/07/2021 2041 by  Leodis Sias, RN Outcome: Progressing   Problem: Coping: Goal: Level of anxiety will decrease 07/07/2021 2041 by Leodis Sias, RN Outcome: Progressing 07/07/2021 2041 by Leodis Sias, RN Outcome: Progressing   Problem: Elimination: Goal: Will not experience complications related to bowel motility 07/07/2021 2041 by Leodis Sias, RN Outcome: Progressing 07/07/2021 2041 by Leodis Sias, RN Outcome: Progressing Goal: Will not experience complications related to urinary retention 07/07/2021 2041 by Leodis Sias, RN Outcome: Progressing 07/07/2021 2041 by Leodis Sias, RN Outcome: Progressing   Problem: Pain Managment: Goal: General experience of comfort will improve 07/07/2021 2041 by Leodis Sias, RN Outcome: Progressing 07/07/2021 2041 by Leodis Sias, RN Outcome: Progressing   Problem: Safety: Goal: Ability to remain free from injury will improve 07/07/2021 2041 by Leodis Sias, RN Outcome: Progressing 07/07/2021 2041 by Leodis Sias, RN Outcome: Progressing   Problem: Skin Integrity: Goal: Risk for impaired skin integrity will decrease 07/07/2021 2041 by Leodis Sias, RN Outcome: Progressing 07/07/2021 2041 by Leodis Sias, RN Outcome: Progressing   Problem: Education: Goal: Knowledge of disease or condition will improve 07/07/2021 2041 by Leodis Sias, RN Outcome: Progressing 07/07/2021 2041 by Leodis Sias, RN Outcome: Progressing Goal: Knowledge of secondary prevention will improve (SELECT ALL) 07/07/2021 2041 by Leodis Sias, RN Outcome: Progressing 07/07/2021 2041 by Leodis Sias, RN Outcome: Progressing Goal: Knowledge of patient specific risk factors will improve (INDIVIDUALIZE FOR PATIENT) 07/07/2021 2041 by Leodis Sias, RN Outcome: Progressing 07/07/2021 2041 by Leodis Sias, RN Outcome: Progressing Goal: Individualized Educational Video(s) 07/07/2021 2041 by Leodis Sias, RN Outcome: Progressing 07/07/2021 2041 by Leodis Sias, RN Outcome: Progressing   Problem: Coping: Goal: Will verbalize positive feelings about self 07/07/2021 2041 by Leodis Sias, RN Outcome: Progressing 07/07/2021 2041 by Leodis Sias, RN Outcome: Progressing   Problem: Health Behavior/Discharge Planning: Goal: Ability to manage health-related needs will improve 07/07/2021 2041 by Leodis Sias, RN Outcome: Progressing 07/07/2021 2041 by Leodis Sias, RN Outcome: Progressing   Problem: Self-Care: Goal: Ability to participate in self-care as condition permits will improve  07/07/2021 2041 by Leodis Sias, RN Outcome: Progressing 07/07/2021 2041 by Leodis Sias, RN Outcome: Progressing   Problem: Spontaneous Subarachnoid Hemorrhage Tissue Perfusion: Goal: Complications of Spontaneous Subarachnoid Hemorrhage will be minimized 07/07/2021 2041 by Leodis Sias, RN Outcome: Progressing 07/07/2021 2041 by Leodis Sias, RN Outcome: Progressing

## 2021-07-08 LAB — CBC
HCT: 35.3 % — ABNORMAL LOW (ref 36.0–46.0)
Hemoglobin: 11.3 g/dL — ABNORMAL LOW (ref 12.0–15.0)
MCH: 28.3 pg (ref 26.0–34.0)
MCHC: 32 g/dL (ref 30.0–36.0)
MCV: 88.5 fL (ref 80.0–100.0)
Platelets: 492 10*3/uL — ABNORMAL HIGH (ref 150–400)
RBC: 3.99 MIL/uL (ref 3.87–5.11)
RDW: 12.7 % (ref 11.5–15.5)
WBC: 10.8 10*3/uL — ABNORMAL HIGH (ref 4.0–10.5)
nRBC: 0 % (ref 0.0–0.2)

## 2021-07-08 LAB — BASIC METABOLIC PANEL
Anion gap: 10 (ref 5–15)
BUN: 30 mg/dL — ABNORMAL HIGH (ref 6–20)
CO2: 22 mmol/L (ref 22–32)
Calcium: 9.2 mg/dL (ref 8.9–10.3)
Chloride: 104 mmol/L (ref 98–111)
Creatinine, Ser: 0.62 mg/dL (ref 0.44–1.00)
GFR, Estimated: >60 mL/min (ref >60)
Glucose, Bld: 124 mg/dL — ABNORMAL HIGH (ref 70–99)
Potassium: 4.6 mmol/L (ref 3.5–5.1)
Sodium: 136 mmol/L (ref 135–145)

## 2021-07-08 LAB — GLUCOSE, CAPILLARY
Glucose-Capillary: 138 mg/dL — ABNORMAL HIGH (ref 70–99)
Glucose-Capillary: 130 mg/dL — ABNORMAL HIGH (ref 70–99)
Glucose-Capillary: 95 mg/dL (ref 70–99)
Glucose-Capillary: 128 mg/dL — ABNORMAL HIGH (ref 70–99)
Glucose-Capillary: 97 mg/dL (ref 70–99)

## 2021-07-08 NOTE — Progress Notes (Signed)
Nutrition Follow-up  DOCUMENTATION CODES:  Obesity unspecified  INTERVENTION:  Following kcal count and discussion with nursing and family, would recommend g-tube placement for pt as her intake is inconsistent and is unlikely to meet needs orally at SNF without nutrition support. Hormel shake and magic cup with all meals  Ensure Enlive po BID, each supplement provides 350 kcal and 20 grams of protein. Continue the following TF regimen: Osmolite 1.5 @ 50 ml/hr x 12 hours (600 ml) 45 ml ProSource TF BID Provides: 980 kcal, 60 grams protein, and 456 ml free water.  MVI with minerals BID to meet bariatric guidelines Calcium citrate with vitamin D 500 mg TID to meet bariatric guidelines   NUTRITION DIAGNOSIS:  Inadequate oral intake related to dysphagia as evidenced by meal completion < 50%. Ongoing.   GOAL:  Patient will meet greater than or equal to 90% of their needs Not met.   MONITOR:  PO intake, Diet advancement, TF tolerance  REASON FOR ASSESSMENT:  Consult Enteral/tube feeding initiation and management  ASSESSMENT:  Pt with PMH of HTN, thoracic aneurysm, gastric bypass, and grave's dz who was admitted with [facility] s/p coiling of ruptured R MCA aneurysm.   5/30 s/p cortrak placement, per xray tip in gastric body, question if this is the gastric pouch nothing noted about gastric bypass in xray.  6/1 diet advanced to Dysphagia 1 with thin liquids; ate 100% of dinner 6/2 aspiration event, NPO and TF held 6/3 TF restarted 6/6 s/p MBS started on Dysphagia diet with nectar thickened liquids   Pt resting in bed at the time of assessment. Did not look at Rd or acknowledge presence. Kcal completed yesterday and pt's intake is extremely variable. Pt does well with intake when her family is present but does not eat much when nursing staff attempts to feed her. Pt will be discharged to a facility for rehab after this admission and will require a G-tube in order to meet nutrition needs as  family will not be able to be present for each meal. Called and discussed this with son who understands and is in agreement with the plan. Also called attending and relayed recommendations.   Average Meal Intake: 6/8-6/14: 27% intake x 12 recorded meals  Nutritionally Relevant Medications: Scheduled Meds:  calcium-vitamin D  1 tablet Per Tube TID   Ensure Enlive  237 mL Oral BID BM   PROSource TF  45 mL Per Tube BID   mouth rinse  15 mL Mouth Rinse q12n4p   multivitamin with minerals  1 tablet Per Tube BID   Continuous Infusions:  feeding supplement (OSMOLITE 1.5 CAL) 50 mL/hr at 07/08/21 0358   PRN Meds: ondansetron  Labs Reviewed: BUN 30 CBG ranges from 95-138 mg/dL over the last 24 hours  Diet Order:   Diet Order             DIET - DYS 1 Room service appropriate? No; Fluid consistency: Nectar Thick  Diet effective now                   EDUCATION NEEDS:  No education needs have been identified at this time  Skin:  Skin Assessment: Reviewed RN Assessment  Last BM:  1400 ml via rectal tube Rectal tube removed 6/9  Height:  Ht Readings from Last 1 Encounters:  06/20/21 5' 7" (1.702 m)    Weight:  Wt Readings from Last 1 Encounters:  07/08/21 101.9 kg   BMI:  Body mass index is 35.18 kg/m.  Estimated Nutritional Needs:  Kcal:  1800-2000 Protein:  95-120 grams Fluid:  >1.8 L/day   Ranell Patrick, RD, LDN Clinical Dietitian RD pager # available in [facility]  After hours/weekend pager # available in [facility]

## 2021-07-08 NOTE — Progress Notes (Signed)
ANTICOAGULATION CONSULT NOTE - Follow Up Consult  Pharmacy Consult for Enoxaparin Indication:  acute bilateral DVT  No Known Allergies  Patient Measurements: Height: 5\' 7"  (170.2 cm) Weight: 101.9 kg (224 lb 10.4 oz) IBW/kg (Calculated) : 61.6 Enoxaparin Dosing Weight: 101.9 kg  Vital Signs: Temp: 98.8 F (37.1 C) (06/15 1139) Temp Source: Oral (06/15 1139) BP: 114/81 (06/15 1139) Pulse Rate: 103 (06/15 1139)  Labs: Recent Labs    07/08/21 0426  HGB 11.3*  HCT 35.3*  PLT 492*  CREATININE 0.62    Estimated Creatinine Clearance: 94 mL/min (by C-G formula based on SCr of 0.62 mg/dL).  Assessment: 82 yof admitted with ruptured brain aneurysm s/p angiogram with coil embolization on 06/21/21. SQ heparin for VTE prophylaxis begun on 06/28/21. Bilateral LE duplex  on 07/03/21 revealed acute bilateral DVT. Pharmacy consulted to transition from SQ heparin to therapeutic Enoxaparin.    CBC and bmet stable, no bleeding reported.  PO intake is being monitored, may need PEG.  Goal of Therapy:  Anti-Xa level 0.6-1 units/ml 4hrs after LMWH dose given Monitor platelets by anticoagulation protocol: Yes   Plan:  Continue Enoxaparin 100 mg SQ q12h. CBC Q72h while on Enoxaparin. Intermittent bmet. Monitor for bleeding. Follow up oral anticoagulation when able.   09/02/21, RPh 07/08/2021,12:55 PM

## 2021-07-08 NOTE — Plan of Care (Signed)
  Problem: Clinical Measurements: Goal: Ability to maintain clinical measurements within normal limits will improve Outcome: Progressing Goal: Will remain free from infection Outcome: Progressing   

## 2021-07-08 NOTE — Progress Notes (Signed)
Physical Therapy Treatment Patient Details Name: Yolanda Boone MRN: [id number] DOB: July 16, 1960 Today's Date: 07/08/2021   History of Present Illness 61 y.o. female presents to [facility] hospital on 06/20/2021 after being found down by EMS. Pt with L gaze and posturing, hypoxic to 60s requiring emergent intubation 06/20/21-06/23/21 . CTH and CTA reveal large SAH with midline shift, concerning for L MCA aneurysm rupture. 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 No PMH on file.    PT Comments    Patient lethargic/sleeping initially. She has increased alertness with external stimulation to voice, light touch, and repositioning. Attempted sitting up on edge of bed, however patient does not actively participate or she is resistive to movement. Bed placed in chair position to assess tolerance for upright activity and safely work on progression of activity with active participation. With the bed in chair position, patient required maximal assistance for trunk support to achieve upright sitting position with intermittent use of bed rails. Posterior lean. She is following less than 25 % of single step commands this session. Noted a right gaze preference, however she does turn head to the left with external stimulation. No active movement with formal exercises of LE, however she does have spontaneous movement of the left leg. Slow progress with functional independence. Recommend to continue PT to maximize independence and decrease caregiver burden.    Recommendations for follow up therapy are one component of a multi-disciplinary discharge planning process, led by the attending physician.  Recommendations may be updated based on patient status, additional functional criteria and insurance authorization.  Follow Up Recommendations  Skilled nursing-short term rehab (<3 hours/day)     Assistance Recommended at Discharge Frequent or  constant Supervision/Assistance  Patient can return home with the following Two people to help with walking and/or transfers;Two people to help with bathing/dressing/bathroom;Assistance with cooking/housework;Assistance with feeding;Direct supervision/assist for medications management;Direct supervision/assist for financial management;Assist for transportation;Help with stairs or ramp for entrance   Equipment Recommendations  Wheelchair (measurements PT);Wheelchair cushion (measurements PT);Hospital bed;Other (comment) (mechanical lift)    Recommendations for Other Services       Precautions / Restrictions Precautions Precautions: Fall Restrictions Weight Bearing Restrictions: No     Mobility  Bed Mobility Overal bed mobility: Needs Assistance             General bed mobility comments: attempted supine to short sitting with 2 seperate bouts. she does not participate with mobility or actively resistive to trunk movement despite distration and multi modal cueing. placed the bed in a chair position for upright activity tolerance and to safely work on sitting balance with decreased external supprot    Transfers                        Ambulation/Gait                   Stairs             Wheelchair Mobility    Modified Rankin (Stroke Patients Only)       Balance Overall balance assessment: Needs assistance Sitting-balance support: Bilateral upper extremity supported Sitting balance-Leahy Scale: Poor Sitting balance - Comments: from the chair position in bed, assisted patient to seated upright with maximal assistance and up to maximial assistance required to maintain midline sitting balance. posterior lean. encouraged patient to use bed rails for UE support which patient demonstrated breifly. 3 bouts of sitting  upright performed with rest breaks betwen bouts of activity Postural control: Posterior lean                                   Cognition Arousal/Alertness: Lethargic Behavior During Therapy: Flat affect Overall Cognitive Status: Difficult to assess Area of Impairment: Following commands                   Current Attention Level: Selective   Following Commands: Follows one step commands inconsistently     Problem Solving: Slow processing, Decreased initiation, Requires verbal cues, Requires tactile cues General Comments: lethargic initially but with increased alertness with external stimulation (tactile, sound, ROM). she appears to have a right gaze preference today with some left inattention. she will turn head to the left with external stimulation. she follows less than 25% of single step commands and no multi-step commands. no family is in the room during this session.        Exercises General Exercises - Lower Extremity Ankle Circles/Pumps: PROM, 10 reps (encouragement for AROM as able with no active participation noted.) Short Arc Quad: PROM, Both, 10 reps, Seated (from chair position in bed. encouragement for AROM as able with no active participation noted.)    General Comments General comments (skin integrity, edema, etc.): vitals stable throughout session      Pertinent Vitals/Pain Pain Assessment Pain Assessment: Faces Faces Pain Scale: No hurt    Home Living                          Prior Function            PT Goals (current goals can now be found in the care plan section) Acute Rehab PT Goals Patient Stated Goal: patient unable to state PT Goal Formulation: Patient unable to participate in goal setting Time For Goal Achievement: 07/15/21 Potential to Achieve Goals: Poor Progress towards PT goals: Progressing toward goals    Frequency    Min 3X/week      PT Plan Current plan remains appropriate    Co-evaluation              AM-PAC PT "6 Clicks" Mobility   Outcome Measure  Help needed turning from your back to your side while in a flat bed without  using bedrails?: Total Help needed moving from lying on your back to sitting on the side of a flat bed without using bedrails?: Total Help needed moving to and from a bed to a chair (including a wheelchair)?: Total Help needed standing up from a chair using your arms (e.g., wheelchair or bedside chair)?: Total Help needed to walk in hospital room?: Total Help needed climbing 3-5 steps with a railing? : Total 6 Click Score: 6    End of Session   Activity Tolerance: Patient limited by fatigue Patient left: in bed;with call bell/phone within reach;with bed alarm set (bilateral PRAFO re-applied at end of session) Nurse Communication: Mobility status PT Visit Diagnosis: Other abnormalities of gait and mobility (R26.89);Muscle weakness (generalized) (M62.81);Hemiplegia and hemiparesis     Time: 7408-1448 PT Time Calculation (min) (ACUTE ONLY): 26 min  Charges:  $Therapeutic Activity: 23-37 mins                     Donna Bernard, PT, MPT    Ina Homes 07/08/2021, 1:09 PM

## 2021-07-08 NOTE — TOC Progression Note (Signed)
Transition of Care [facility]) - Progression Note    Patient Details  Name: Yolanda Boone MRN: [id number] Date of Birth: 1960-09-10  [facility] CM/SW Contact  Kermit Balo, RN Phone Number: 07/08/2021, 4:05 PM  Clinical Narrative:    CM spoke to patients daughter and she provided 2 rehabs in [facility]:  [facility] [phone] [facility] [phone] CM has called and left voicemails for both facilities. CM has also encouraged the patients daughter to look for other SNF in the area.  TOC following.   Expected Discharge Plan: Skilled Nursing Facility Barriers to Discharge: Continued Medical Work up  Expected Discharge Plan and Services Expected Discharge Plan: Skilled Nursing Facility In-house Referral: Clinical Social Work   Post Acute Care Choice: Skilled Nursing Facility Living arrangements for the past 2 months: Single Family Home                                       Social Determinants of Health (SDOH) Interventions    Readmission Risk Interventions     No data to display

## 2021-07-09 LAB — GLUCOSE, CAPILLARY
Glucose-Capillary: 112 mg/dL — ABNORMAL HIGH (ref 70–99)
Glucose-Capillary: 113 mg/dL — ABNORMAL HIGH (ref 70–99)
Glucose-Capillary: 115 mg/dL — ABNORMAL HIGH (ref 70–99)
Glucose-Capillary: 134 mg/dL — ABNORMAL HIGH (ref 70–99)
Glucose-Capillary: 137 mg/dL — ABNORMAL HIGH (ref 70–99)
Glucose-Capillary: 143 mg/dL — ABNORMAL HIGH (ref 70–99)

## 2021-07-09 MED ORDER — ACETAMINOPHEN 650 MG RE SUPP: 650.0000 mg | RECTAL | Status: DC | PRN

## 2021-07-09 MED ORDER — OYSTER SHELL CALCIUM/D3 500-5 MG-MCG PO TABS
1.0000 | ORAL_TABLET | Freq: Three times a day (TID) | ORAL | Status: DC
  Administered 2021-07-09 – 2021-07-16 (×21): 1 via ORAL
  Filled 2021-07-09 (×21): qty 1

## 2021-07-09 MED ORDER — ACETAMINOPHEN 325 MG PO TABS
650.0000 mg | ORAL_TABLET | ORAL | Status: DC | PRN
  Administered 2021-07-11 – 2021-07-16 (×3): 650 mg via ORAL
  Filled 2021-07-09 (×3): qty 2

## 2021-07-09 MED ORDER — ADULT MULTIVITAMIN W/MINERALS CH
1.0000 | ORAL_TABLET | Freq: Two times a day (BID) | ORAL | Status: DC
  Administered 2021-07-09 – 2021-07-16 (×14): 1 via ORAL
  Filled 2021-07-09 (×14): qty 1

## 2021-07-09 NOTE — Plan of Care (Signed)
  Problem: Education: Goal: Knowledge of General Education information will improve Description Including pain rating scale, medication(s)/side effects and non-pharmacologic comfort measures Outcome: Progressing   

## 2021-07-09 NOTE — NC FL2 (Addendum)
[place] MEDICAID FL2 LEVEL OF CARE SCREENING TOOL     IDENTIFICATION  Patient Name: Yolanda Boone Birthdate: Dec 10, 1960 Sex: female Admission Date (Current Location): 06/20/2021  [facility] and [place] Number:  Producer, television/film/video and Address:  [facility], 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, left (HCC) 06/22/2021   Acute respiratory failure (HCC) 06/22/2021   Compression of brain due to nontraumatic subarachnoid hemorrhage (HCC) 06/22/2021   Hx of aortic aneurysm 06/22/2021   Acute respiratory failure with hypoxia (HCC)    Hx of repair of dissecting thoracic aortic aneurysm, Stanford type B 06/02/2021   Closed fracture of fifth metatarsal bone 08/06/2020   Graves disease 10/08/2019   Essential hypertension 10/03/2012   Allergic rhinitis 12/19/2011    Orientation RESPIRATION BLADDER Height & Weight      (expressive aphasia)  Normal Incontinent, External catheter Weight: 101.4 kg Height:  5\' 7"  (170.2 cm)  BEHAVIORAL SYMPTOMS/MOOD NEUROLOGICAL BOWEL NUTRITION STATUS      Incontinent Diet (dysphagia 1 with nectar thick liquids)  AMBULATORY STATUS COMMUNICATION OF NEEDS Skin   Extensive Assist Verbally Normal                       Personal Care Assistance Level of Assistance  Bathing Bathing Assistance: Maximum assistance Feeding assistance: Maximum assistance Dressing Assistance: Maximum assistance     Functional  Limitations Info             SPECIAL CARE FACTORS FREQUENCY  PT (By licensed PT), OT (By licensed OT), Speech therapy     PT Frequency: 5x/week OT Frequency: 5x/week     Speech Therapy Frequency: 3x/week      Contractures Contractures Info: Not present    Additional Factors Info  Code Status, Allergies Code Status Info: Full Allergies Info: NKA           Current Medications (07/09/2021):  This is the current hospital active medication list Current Facility-Administered Medications  Medication Dose Route Frequency Provider Last Rate Last Admin   acetaminophen (TYLENOL) tablet 650 mg  650 mg Per Tube Q4H PRN 07/11/2021, MD   650 mg at 07/05/21 2143   Or   acetaminophen (TYLENOL) suppository 650 mg  650 mg Rectal Q4H PRN Agarwala, 2144, MD       albuterol (PROVENTIL) (2.5 MG/3ML) 0.083% nebulizer solution 2.5 mg  2.5 mg Nebulization Q6H PRN Agarwala, Daleen Bo, MD       calcium-vitamin D (OSCAL WITH D) 500-5 MG-MCG per tablet 1 tablet  1 tablet Per Tube TID Daleen Bo, MD   1 tablet at 07/08/21 2149   chlorhexidine (PERIDEX) 0.12 % solution 15 mL  15 mL Mouth Rinse BID Agarwala, 2150, MD   15 mL at 07/08/21 2144   enoxaparin (LOVENOX) injection 100 mg  100 mg Subcutaneous Q12H 07/10/21, MD   100 mg at 07/08/21 1743   feeding supplement (ENSURE ENLIVE / ENSURE PLUS) liquid 237  mL  237 mL Oral BID BM Agarwala, Ravi, MD   237 mL at 07/08/21 0953   feeding supplement (OSMOLITE 1.5 CAL) liquid 600 mL  600 mL Per Tube Continuous Agarwala, Daleen Bo, MD 50 mL/hr at 07/09/21 0600 Infusion Verify at 07/09/21 0600   feeding supplement (PROSource TF) liquid 45 mL  45 mL Per Tube BID Lynnell Catalan, MD   45 mL at 07/08/21 2149   labetalol (NORMODYNE) injection 10 mg  10 mg Intravenous Q2H PRN Lynnell Catalan, MD   10 mg at 06/23/21 1531   MEDLINE mouth rinse  15 mL Mouth Rinse q12n4p Agarwala, Daleen Bo, MD   15 mL at 07/08/21 1515   multivitamin with minerals tablet 1 tablet  1 tablet Per  Tube BID Lynnell Catalan, MD   1 tablet at 07/08/21 2149   niMODipine (NIMOTOP) capsule 60 mg  60 mg Oral Q4H Agarwala, Daleen Bo, MD       Or   niMODipine (NYMALIZE) 6 MG/ML oral solution 60 mg  60 mg Per Tube Q4H Agarwala, Ravi, MD   60 mg at 07/08/21 1514   ondansetron (ZOFRAN-ODT) disintegrating tablet 4 mg  4 mg Oral Q6H PRN Lynnell Catalan, MD       Or   ondansetron (ZOFRAN) injection 4 mg  4 mg Intravenous Q6H PRN Agarwala, Daleen Bo, MD       white petrolatum (VASELINE) gel   Topical PRN Lynnell Catalan, MD         Discharge Medications: Please see discharge summary for a list of discharge medications.  Relevant Imaging Results:  Relevant Lab Results:   Additional Information SSn: [id number] [phone]. Pfizer COVID-19 Vaccine 05/14/2019 , 04/23/2019  Kermit Balo, RN

## 2021-07-09 NOTE — Progress Notes (Signed)
Speech Language Pathology Treatment: Dysphagia;Cognitive-Linquistic  Patient Details Name: Yolanda Boone MRN: [id number] DOB: 1960-10-11 Today's Date: 07/09/2021 Time: 7425-9563 SLP Time Calculation (min) (ACUTE ONLY): 30 min  Assessment / Plan / Recommendation Clinical Impression  Pt seen at bedside for skilled ST intervention targeting goals for dysphagia and aphasia. Pt was drinking thin liquid via straw upon arrival of SLP. Her son and brother were in attendance.   Pt continues to struggle with following directions and verbalizing. Her son reports she called his name Yolanda Boone) and indicated her back was hurting. She was unable to verbalize his name today. Verbal responses today were limited to "mm-hm". Pt was unable to follow verbal directions, but accuracy improved slightly with visual and verbal cues. She was able to make a choice (given juice or applesauce) with gesture, but was unable to verbalize her choice. She did not verbalize "yes" or "no" today.   Pt was observed with solid textures (graham cracker), which she self fed. Timely oral prep and clearing noted, without anterior leakage. Trials of thin liquid were also observed with no cough response elicited, even after multiple boluses taken consecutively. Pt appears ready to advance to dys 2 (fine chop) and thin liquids. Safe swallow precautions updated at [facility].   Pt's son was encouraged to look at family pictures with pt and encourage identifying family members or pointing to one. He was encouraged to give her choices when possible, name family members, and follow verbal directions. Pt will continue to benefit from skilled ST intervention to maximize functional and effective communication.   HPI HPI: Pt is a 61 y.o. female who presented to the ED on 06/20/21 after being found down by EMS. Left gaze, posturing, and hypoxia the 60's noted in the ED and pt intubated. ETT 5/28-5/31 CT head and CTA revealed large SAH with midline shift concerning  for left MCA aneurysm rupture. 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 No PMH in chart.      SLP Plan  Continue with current plan of care      Recommendations for follow up therapy are one component of a multi-disciplinary discharge planning process, led by the attending physician.  Recommendations may be updated based on patient status, additional functional criteria and insurance authorization.    Recommendations  Diet recommendations: Dysphagia 2 (fine chop);Thin liquid Liquids provided via: Cup;Straw Medication Administration: Via alternative means Supervision: Staff to assist with self feeding;Full supervision/cueing for compensatory strategies Compensations: Slow rate;Small sips/bites;Minimize environmental distractions Postural Changes and/or Swallow Maneuvers: Seated upright 90 degrees                Oral Care Recommendations: Oral care BID;Staff/trained caregiver to provide oral care Follow Up Recommendations: Skilled nursing-short term rehab (<3 hours/day) Assistance recommended at discharge: Frequent or constant Supervision/Assistance SLP Visit Diagnosis: Dysphagia, oropharyngeal phase (R13.12);Aphasia (R47.01) Plan: Continue with current plan of care          Azahel Belcastro B. Murvin Natal, [facility], CCC-SLP Speech Language Pathologist Office: [phone]  Leigh Aurora 07/09/2021, 11:51 AM

## 2021-07-09 NOTE — Progress Notes (Signed)
  NEUROSURGERY PROGRESS NOTE   Pt seen and examined. No issues overnight.   EXAM: Temp:  [98.3 F (36.8 C)-99.5 F (37.5 C)] 99 F (37.2 C) (06/16 1217) Pulse Rate:  [91-103] 98 (06/16 1217) Resp:  [18] 18 (06/16 1217) BP: (101-111)/(69-83) 105/74 (06/16 1217) SpO2:  [98 %-100 %] 99 % (06/16 1217) Weight:  [101.4 kg] 101.4 kg (06/16 0404) Intake/Output      06/15 0701 06/16 0700 06/16 0701 06/17 0700   P.O. 0 240   Other     NG/GT 1305.8 100   Total Intake(mL/kg) 1305.8 (12.9) 340 (3.4)   Urine (mL/kg/hr) 2100 (0.9)    Emesis/NG output     Stool 0    Total Output 2100    Net -794.2 +340        Stool Occurrence 1 x     Awake, alert Non-verbal Face symmetric Not-following commands Moves LUE/LLE purposefully, Flicker of movement noted right hand Abd soft, NT  LABS: Lab Results  Component Value Date   CREATININE 0.62 07/08/2021   BUN 30 (H) 07/08/2021   NA 136 07/08/2021   K 4.6 07/08/2021   CL 104 07/08/2021   CO2 22 07/08/2021   Lab Results  Component Value Date   WBC 10.8 (H) 07/08/2021   HGB 11.3 (L) 07/08/2021   HCT 35.3 (L) 07/08/2021   MCV 88.5 07/08/2021   PLT 492 (H) 07/08/2021      IMPRESSION: - 61 y.o. female SAH d#18 s/p coiling ruptured RMCA aneurysm. Right hemiparesis subtly improved with some distal RUE movement, able to mouth and nod to questions - Inadequate oral intake, but appears to be eating more.  - Bilateral DVT - Type B Aortic dissection - Multiple unruptured intracranial aneurysms  PLAN: - Cont supportive care - Cont PT/OT - plan on SNF placement - Cont Nimotop to finish 21 days - Lovenox for DVT - Cont TF. Spoke with surgery, with pts history of bariatric surgery, placement of gastrostomy tube carries non-trivial risk as this would likely require open or endoscopic placement. As the patient appears to take adequate oral intake when properly motivated (at this point only by family), I think it more reasonable to hold-off on  G-tube and attempt to allow her to maintain nutrition orally. If she fails to take adequate nutrition at the SNF we can then consider gastrostomy at a later date.    Lisbeth Renshaw, MD [facility]

## 2021-07-10 LAB — GLUCOSE, CAPILLARY
Glucose-Capillary: 107 mg/dL — ABNORMAL HIGH (ref 70–99)
Glucose-Capillary: 124 mg/dL — ABNORMAL HIGH (ref 70–99)
Glucose-Capillary: 132 mg/dL — ABNORMAL HIGH (ref 70–99)
Glucose-Capillary: 73 mg/dL (ref 70–99)
Glucose-Capillary: 84 mg/dL (ref 70–99)
Glucose-Capillary: 98 mg/dL (ref 70–99)

## 2021-07-10 NOTE — Progress Notes (Signed)
Patient ID: Yolanda Boone, female   DOB: Aug 29, 1960, 61 y.o.   MRN: [id number] Vital signs are stable Patient is arousable opens eyes and tolerating diet Clinically stable Placement

## 2021-07-11 LAB — CBC
HCT: 32.8 % — ABNORMAL LOW (ref 36.0–46.0)
Hemoglobin: 10.7 g/dL — ABNORMAL LOW (ref 12.0–15.0)
MCH: 28.7 pg (ref 26.0–34.0)
MCHC: 32.6 g/dL (ref 30.0–36.0)
MCV: 87.9 fL (ref 80.0–100.0)
Platelets: 433 10*3/uL — ABNORMAL HIGH (ref 150–400)
RBC: 3.73 MIL/uL — ABNORMAL LOW (ref 3.87–5.11)
RDW: 12.3 % (ref 11.5–15.5)
WBC: 8 10*3/uL (ref 4.0–10.5)
nRBC: 0 % (ref 0.0–0.2)

## 2021-07-11 LAB — GLUCOSE, CAPILLARY
Glucose-Capillary: 105 mg/dL — ABNORMAL HIGH (ref 70–99)
Glucose-Capillary: 115 mg/dL — ABNORMAL HIGH (ref 70–99)
Glucose-Capillary: 165 mg/dL — ABNORMAL HIGH (ref 70–99)
Glucose-Capillary: 93 mg/dL (ref 70–99)
Glucose-Capillary: 94 mg/dL (ref 70–99)

## 2021-07-11 NOTE — Progress Notes (Signed)
ANTICOAGULATION CONSULT NOTE - Follow Up Consult  Pharmacy Consult for Enoxaparin Indication:  acute bilateral DVT  No Known Allergies  Patient Measurements: Height: 5\' 7"  (170.2 cm) Weight: 99 kg (218 lb 4.1 oz) IBW/kg (Calculated) : 61.6 Enoxaparin Dosing Weight: 101.9 kg  Vital Signs: Temp: 97.5 F (36.4 C) (06/18 0500) Temp Source: Oral (06/18 0500) BP: 114/77 (06/18 0500) Pulse Rate: 81 (06/18 0500)   Estimated Creatinine Clearance: 92.7 mL/min (by C-G formula based on SCr of 0.62 mg/dL).  Assessment: 79 yof admitted with ruptured brain aneurysm s/p angiogram with coil embolization on 06/21/21. SQ heparin for VTE prophylaxis begun on 06/28/21. Bilateral LE duplex on 07/03/21 revealed acute bilateral DVT. Pharmacy consulted to transition from SQ heparin to therapeutic Enoxaparin.   6/18: CBC and renal function stable. No signs or symptoms of bleeding reported.   Goal of Therapy:  Anti-Xa level 0.6-1 units/ml 4hrs after LMWH dose given Monitor platelets by anticoagulation protocol: Yes   Plan:  Continue Enoxaparin 100 mg SQ q12h. CBC Q72h while on Enoxaparin Monitor renal function Monitor for s/sx of bleeding.  7/18, PharmD PGY2 Infectious Diseases Pharmacy Resident   Please check [email or web address] for unit-specific pharmacy phone numbers

## 2021-07-11 NOTE — Progress Notes (Signed)
Patient ID: Yolanda Boone, female   DOB: 06/08/1960, 61 y.o.   MRN: [id number] Patient is alert and pleasant though nonverbal She responds appropriately sometimes with smiles and gestures Left leg bothers her a bit with movement but she is otherwise tolerating some reasonable activity Daughter in the room this morning on visit

## 2021-07-12 ENCOUNTER — Other Ambulatory Visit (HOSPITAL_COMMUNITY): Payer: BC Managed Care – PPO

## 2021-07-12 LAB — GLUCOSE, CAPILLARY
Glucose-Capillary: 104 mg/dL — ABNORMAL HIGH (ref 70–99)
Glucose-Capillary: 110 mg/dL — ABNORMAL HIGH (ref 70–99)
Glucose-Capillary: 112 mg/dL — ABNORMAL HIGH (ref 70–99)
Glucose-Capillary: 115 mg/dL — ABNORMAL HIGH (ref 70–99)
Glucose-Capillary: 83 mg/dL (ref 70–99)
Glucose-Capillary: 84 mg/dL (ref 70–99)
Glucose-Capillary: 94 mg/dL (ref 70–99)

## 2021-07-12 NOTE — TOC Progression Note (Addendum)
Transition of Care [facility]) - Progression Note    Patient Details  Name: Yolanda Boone MRN: [id number] Date of Birth: Mar 02, 1960  Transition of [facility]) CM/SW Contact  Kermit Balo, RN Phone Number: 07/12/2021, 12:56 PM  Clinical Narrative:    CM faxed patients information to [facility] in [place] on Friday twice. CM checked in with [facility] today and they still say they have not received the patients information. CM has sent information a 3rd time.  CM also talked to [place] at [facility] in [place] and faxed her the patients information.  Family at the bedside and updated. They also were updated on transportation to a facility outside of the area will be a private pay cost.  TOC has left voice mails with both facilities to see if they have received the patients information and if able to offer a bed.  TOC following.  1527: [facility] is not in network with pts insurance.  CM has left 2 voicemails for [facility] admissions to see if able to offer a bed.    Expected Discharge Plan: Skilled Nursing Facility Barriers to Discharge: Continued Medical Work up  Expected Discharge Plan and Services Expected Discharge Plan: Skilled Nursing Facility In-house Referral: Clinical Social Work   Post Acute Care Choice: Skilled Nursing Facility Living arrangements for the past 2 months: Single Family Home                                       Social Determinants of Health (SDOH) Interventions    Readmission Risk Interventions     No data to display

## 2021-07-12 NOTE — Progress Notes (Signed)
Patient approved for 2W by Dr.Nundkumar. Cardiac monitoring discontinued and changed from progressive to medsurg. 2W now currently full, but next open bed - patient can transfer until bed ready at SNF.

## 2021-07-12 NOTE — Progress Notes (Signed)
Physical Therapy Treatment Patient Details Name: Yolanda Boone MRN: [id number] DOB: 16-Oct-1960 Today's Date: 07/12/2021   History of Present Illness 61 y.o. female presents to [facility] hospital on 06/20/2021 after being found down by EMS. Pt with L gaze and posturing, hypoxic to 60s requiring emergent intubation 06/20/21-06/23/21 . CTH and CTA reveal large SAH with midline shift, concerning for L MCA aneurysm rupture. 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 No PMH on file.    PT Comments    Pt progressing towards physical therapy goals. Pt engaged through ~20 minutes of EOB activity this session with daughter present to assist in motivating pt. Pt followed commands ~50% of the time and required increased repetition of cues. Continue to feel this patient would benefit from SNF level rehab at d/c to maximize functional independence and safety. Will continue to follow and progress as able per POC.    Recommendations for follow up therapy are one component of a multi-disciplinary discharge planning process, led by the attending physician.  Recommendations may be updated based on patient status, additional functional criteria and insurance authorization.  Follow Up Recommendations  Skilled nursing-short term rehab (<3 hours/day)     Assistance Recommended at Discharge Frequent or constant Supervision/Assistance  Patient can return home with the following Two people to help with walking and/or transfers;Two people to help with bathing/dressing/bathroom;Assistance with cooking/housework;Assistance with feeding;Direct supervision/assist for medications management;Direct supervision/assist for financial management;Assist for transportation;Help with stairs or ramp for entrance   Equipment Recommendations  Wheelchair (measurements PT);Wheelchair cushion (measurements PT);Hospital bed;Other (comment) (mechanical lift)     Recommendations for Other Services       Precautions / Restrictions Precautions Precautions: Fall Restrictions Weight Bearing Restrictions: No     Mobility  Bed Mobility Overal bed mobility: Needs Assistance Bed Mobility: Rolling Rolling: Max assist   Supine to sit: Max assist, +2 for physical assistance, +2 for safety/equipment, HOB elevated Sit to supine: Max assist, +2 for physical assistance, +2 for safety/equipment   General bed mobility comments: Pt resisting at times. Transitioned to EOB with +2 assist for trunk elevation to full sitting position, as well as to gain/maintain sitting balance.    Transfers                   General transfer comment: Did not progress to OOB mobility this session.    Ambulation/Gait                   Stairs             Wheelchair Mobility    Modified Rankin (Stroke Patients Only) Modified Rankin (Stroke Patients Only) Pre-Morbid Rankin Score: No symptoms Modified Rankin: Severe disability     Balance Overall balance assessment: Needs assistance Sitting-balance support: Bilateral upper extremity supported Sitting balance-Leahy Scale: Poor Sitting balance - Comments: Occasional bouts of strong posterior lean, R lateral lean. Truncal alignment and sitting balance improved with cervical alginment to neutral but unable to maintain head position without assist. Postural control: Posterior lean, Right lateral lean     Standing balance comment: Not tested                            Cognition Arousal/Alertness: Awake/alert Behavior During Therapy: Flat affect Overall Cognitive Status: Difficult to assess Area of Impairment: Following commands, Awareness, Problem solving  Current Attention Level: Selective   Following Commands: Follows one step commands inconsistently Safety/Judgement: Decreased awareness of safety, Decreased awareness of deficits Awareness:  Intellectual Problem Solving: Slow processing, Decreased initiation, Requires verbal cues, Requires tactile cues          Exercises Other Exercises Other Exercises: EOB: Sitting balance activity with cervical spine stretched into neutral, as well as to the L. Other Exercises: EOB: Pt rubbed lotion into B thighs, B forearms, B hands Other Exercises: EOB: Active assist UE elevation with therapist facilitating scapular rotation L>R Other Exercises: EOB: Pt reaching for lotion bottle on the L and placed on the bed to her R.    General Comments        Pertinent Vitals/Pain Pain Assessment Pain Assessment: Faces Faces Pain Scale: No hurt Pain Descriptors / Indicators: Grimacing Pain Intervention(s): Limited activity within patient's tolerance, Monitored during session, Repositioned    Home Living                          Prior Function            PT Goals (current goals can now be found in the care plan section) Acute Rehab PT Goals Patient Stated Goal: patient unable to state PT Goal Formulation: Patient unable to participate in goal setting Time For Goal Achievement: 07/15/21 Potential to Achieve Goals: Poor Progress towards PT goals: Progressing toward goals    Frequency    Min 3X/week      PT Plan Current plan remains appropriate    Co-evaluation              AM-PAC PT "6 Clicks" Mobility   Outcome Measure  Help needed turning from your back to your side while in a flat bed without using bedrails?: Total Help needed moving from lying on your back to sitting on the side of a flat bed without using bedrails?: Total Help needed moving to and from a bed to a chair (including a wheelchair)?: Total Help needed standing up from a chair using your arms (e.g., wheelchair or bedside chair)?: Total Help needed to walk in hospital room?: Total Help needed climbing 3-5 steps with a railing? : Total 6 Click Score: 6    End of Session Equipment Utilized  During Treatment: Oxygen Activity Tolerance: Patient limited by fatigue Patient left: in bed;with call bell/phone within reach;with bed alarm set;with family/visitor present (Bed in chair position) Nurse Communication: Mobility status PT Visit Diagnosis: Other abnormalities of gait and mobility (R26.89);Muscle weakness (generalized) (M62.81);Hemiplegia and hemiparesis Hemiplegia - Right/Left: Right Hemiplegia - dominant/non-dominant: Dominant Hemiplegia - caused by: Nontraumatic SAH     Time: 6063-0160 PT Time Calculation (min) (ACUTE ONLY): 26 min  Charges:  $Therapeutic Activity: 23-37 mins                     Conni Slipper, PT, DPT Acute Rehabilitation Services Secure Chat Preferred Office: [phone]    Marylynn Pearson 07/12/2021, 1:54 PM

## 2021-07-13 LAB — GLUCOSE, CAPILLARY
Glucose-Capillary: 103 mg/dL — ABNORMAL HIGH (ref 70–99)
Glucose-Capillary: 121 mg/dL — ABNORMAL HIGH (ref 70–99)
Glucose-Capillary: 80 mg/dL (ref 70–99)
Glucose-Capillary: 87 mg/dL (ref 70–99)
Glucose-Capillary: 93 mg/dL (ref 70–99)
Glucose-Capillary: 99 mg/dL (ref 70–99)

## 2021-07-13 NOTE — Plan of Care (Signed)
  Problem: Health Behavior/Discharge Planning: Goal: Ability to manage health-related needs will improve Outcome: Progressing   Problem: Clinical Measurements: Goal: Ability to maintain clinical measurements within normal limits will improve Outcome: Progressing Goal: Will remain free from infection Outcome: Progressing Goal: Diagnostic test results will improve Outcome: Progressing Goal: Respiratory complications will improve Outcome: Progressing Goal: Cardiovascular complication will be avoided Outcome: Progressing   Problem: Activity: Goal: Risk for activity intolerance will decrease Outcome: Progressing   Problem: Nutrition: Goal: Adequate nutrition will be maintained Outcome: Progressing   Problem: Coping: Goal: Level of anxiety will decrease Outcome: Progressing   Problem: Elimination: Goal: Will not experience complications related to bowel motility Outcome: Progressing Goal: Will not experience complications related to urinary retention Outcome: Progressing   Problem: Pain Managment: Goal: General experience of comfort will improve Outcome: Progressing   Problem: Safety: Goal: Ability to remain free from injury will improve Outcome: Progressing   Problem: Skin Integrity: Goal: Risk for impaired skin integrity will decrease Outcome: Progressing   Problem: Health Behavior/Discharge Planning: Goal: Ability to manage health-related needs will improve Outcome: Progressing   Problem: Self-Care: Goal: Ability to participate in self-care as condition permits will improve Outcome: Progressing   Problem: Spontaneous Subarachnoid Hemorrhage Tissue Perfusion: Goal: Complications of Spontaneous Subarachnoid Hemorrhage will be minimized Outcome: Progressing

## 2021-07-13 NOTE — Progress Notes (Signed)
Occupational Therapy Treatment Patient Details Name: Yolanda Boone MRN: [id number] DOB: 1960-08-31 Today's Date: 07/13/2021   History of present illness 61 y.o. female presents to [facility] hospital on 06/20/2021 after being found down by EMS. Pt with L gaze and posturing, hypoxic to 60s requiring emergent intubation 06/20/21-06/23/21 . CTH and CTA reveal large SAH with midline shift, concerning for L MCA aneurysm rupture. 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 No PMH on file.   OT comments  Pt progressing towards goals, pt completing bed mobility mod-max A +2, able to sit EOB without external support, fatigues after ~10 min of EOB activity. Pt following ~50% of commands during session, able to perform seated grooming task/UB bathing with mod A. Pt able to functional reach/grasp utensil for self feeding with RUE  for a few bites of food, however then transitions to using LUE to self-feed. Daughter in room, continued to encourage having pt perform use RUE functionally for ADL tasks. Pt presenting with impairments listed below, will follow acutely. Continue to recommend SNF at d/c.   Recommendations for follow up therapy are one component of a multi-disciplinary discharge planning process, led by the attending physician.  Recommendations may be updated based on patient status, additional functional criteria and insurance authorization.    Follow Up Recommendations  Skilled nursing-short term rehab (<3 hours/day)    Assistance Recommended at Discharge Frequent or constant Supervision/Assistance  Patient can return home with the following  Two people to help with walking and/or transfers;Assistance with cooking/housework;Assistance with feeding;Direct supervision/assist for medications management;Direct supervision/assist for financial management;Assist for transportation;Help with stairs or ramp for entrance;A lot of  help with bathing/dressing/bathroom   Equipment Recommendations  Other (comment)    Recommendations for Other Services Other (comment)    Precautions / Restrictions Precautions Precautions: Fall Restrictions Weight Bearing Restrictions: No       Mobility Bed Mobility Overal bed mobility: Needs Assistance       Supine to sit: Mod assist Sit to supine: Max assist, +2 for physical assistance   General bed mobility comments: Pt resisting at times. Transitioned to EOB with +2 assist for trunk elevation to full sitting position, as well as to gain/maintain sitting balance.    Transfers                   General transfer comment: deferred, pt fatigued     Balance Overall balance assessment: Needs assistance Sitting-balance support: Bilateral upper extremity supported Sitting balance-Leahy Scale: Fair Sitting balance - Comments: able to sit EOB with BUE support, LOB when challenged Postural control: Posterior lean, Left lateral lean   Standing balance-Leahy Scale: Zero Standing balance comment: Not tested                           ADL either performed or assessed with clinical judgement   ADL Overall ADL's : Needs assistance/impaired         Upper Body Bathing: Moderate assistance Upper Body Bathing Details (indicate cue type and reason): has ROM/strength to perform, follows commands inconsistently to perform                                Extremity/Trunk Assessment Upper Extremity Assessment Upper Extremity Assessment: RUE deficits/detail RUE Deficits / Details: using functionally to assist with covers, reachign to grasp items and washing UB  and self feeding RUE Coordination: decreased fine motor;decreased gross motor LUE Deficits / Details: using purposefully for grooming and to grasp and release objects but not on command. Continues to push/extend with L when it is not occupie, and grasps bed rail LUE Sensation: decreased light  touch;decreased proprioception LUE Coordination: decreased fine motor;decreased gross motor   Lower Extremity Assessment Lower Extremity Assessment: Defer to PT evaluation        Vision   Vision Assessment?: Vision impaired- to be further tested in functional context Additional Comments: R gaze preference, L inattn   Perception Perception Perception: Not tested   Praxis Praxis Praxis: Not tested    Cognition Arousal/Alertness: Awake/alert Behavior During Therapy: Flat affect Overall Cognitive Status: Difficult to assess Area of Impairment: Following commands, Awareness, Problem solving                   Current Attention Level: Selective   Following Commands: Follows one step commands inconsistently Safety/Judgement: Decreased awareness of safety, Decreased awareness of deficits Awareness: Intellectual Problem Solving: Slow processing, Decreased initiation, Requires verbal cues, Requires tactile cues          Exercises General Exercises - Upper Extremity Shoulder Flexion: AAROM, 5 reps, Right, Seated Other Exercises Other Exercises: Elbow ROM    Shoulder Instructions       General Comments VSS on RA    Pertinent Vitals/ Pain       Pain Assessment Pain Assessment: Faces Pain Score: 0-No pain Pain Descriptors / Indicators: Discomfort Pain Intervention(s): Limited activity within patient's tolerance, Monitored during session, Premedicated before session  Home Living                                          Prior Functioning/Environment              Frequency  Min 2X/week        Progress Toward Goals  OT Goals(current goals can now be found in the care plan section)  Progress towards OT goals: Progressing toward goals  Acute Rehab OT Goals Patient Stated Goal: pt unable to state OT Goal Formulation: Patient unable to participate in goal setting ADL Goals Pt Will Perform Grooming: with min guard assist;bed  level;sitting Additional ADL Goal #3: pt will perform bed mobility min A +2 in prep for ADLs  Plan Frequency remains appropriate;Discharge plan needs to be updated    Co-evaluation                 AM-PAC OT "6 Clicks" Daily Activity     Outcome Measure   Help from another person eating meals?: A Lot Help from another person taking care of personal grooming?: A Lot Help from another person toileting, which includes using toliet, bedpan, or urinal?: Total Help from another person bathing (including washing, rinsing, drying)?: Total Help from another person to put on and taking off regular upper body clothing?: A Lot Help from another person to put on and taking off regular lower body clothing?: Total 6 Click Score: 9    End of Session    OT Visit Diagnosis: Other symptoms and signs involving cognitive function;Other symptoms and signs involving the nervous system (R29.898)   Activity Tolerance Patient tolerated treatment well   Patient Left in bed;with call bell/phone within reach;with bed alarm set;with family/visitor present   Nurse Communication Mobility status  Time: 2035-5974 OT Time Calculation (min): 31 min  Charges: OT General Charges $OT Visit: 1 Visit OT Treatments $Self Care/Home Management : 8-22 mins $Therapeutic Activity: 8-22 mins  Alfonzo Beers, OTD, OTR/L Acute Rehab [phone]   Mayer Masker 07/13/2021, 4:25 PM

## 2021-07-13 NOTE — Progress Notes (Signed)
  NEUROSURGERY PROGRESS NOTE   Pt seen and examined. No issues overnight.   EXAM: Temp:  [97.8 F (36.6 C)-99.9 F (37.7 C)] 97.8 F (36.6 C) (06/20 0749) Pulse Rate:  [92-93] 93 (06/20 0749) Resp:  [16-20] 16 (06/20 0749) BP: (107-119)/(74-85) 115/74 (06/20 0749) SpO2:  [99 %-100 %] 99 % (06/20 0749) Intake/Output      06/19 0701 06/20 0700 06/20 0701 06/21 0700   P.O. 840    NG/GT 0    Total Intake(mL/kg) 840 (8.2)    Urine (mL/kg/hr) 550 (0.2)    Stool 0    Total Output 550    Net +290         Urine Occurrence 2 x 1 x   Stool Occurrence 3 x 1 x    Awake, alert Can say single words Nods to questions Face symmetric Not-following commands Moves BUE/BLE purposefully  LABS: Lab Results  Component Value Date   CREATININE 0.62 07/08/2021   BUN 30 (H) 07/08/2021   NA 136 07/08/2021   K 4.6 07/08/2021   CL 104 07/08/2021   CO2 22 07/08/2021   Lab Results  Component Value Date   WBC 8.0 07/11/2021   HGB 10.7 (L) 07/11/2021   HCT 32.8 (L) 07/11/2021   MCV 87.9 07/11/2021   PLT 433 (H) 07/11/2021      IMPRESSION: - 61 y.o. female SAH d#21 s/p coiling ruptured RMCA aneurysm. Right hemiparesis and aphasia are progressively improving - Inadequate oral intake, but appears to be eating more.  - Bilateral DVT - Type B Aortic dissection - Multiple unruptured intracranial aneurysms  PLAN: - Cont supportive care - Cont PT/OT - Stable for transfer to SNF when placement is available - Can d/c Nimotop today - Lovenox for DVT    Lisbeth Renshaw, MD [facility]

## 2021-07-13 NOTE — Progress Notes (Signed)
Speech Language Pathology Treatment: Dysphagia;Cognitive-Linquistic  Patient Details Name: Cassi Jenne MRN: [id number] DOB: Jan 05, 1961 Today's Date: 07/13/2021 Time: 3212-2482 SLP Time Calculation (min) (ACUTE ONLY): 20 min  Assessment / Plan / Recommendation Clinical Impression  Patient seen for skilled treatment session focused on dysphagia and aphasia goals. Patient was awake and alert and RN giving her medications (whole in puree). Patient no longer has Cortrak feeding tube. SLP observed patient with toleration of thin liquids via straw sips and dysphagia 3 solids. No overt s/s aspiration or penetration observed but patient requiring cues for attention and exhibited prolonged mastication and oral transit of solids. She was able to point to choose which drink she wanted (soda or juice) and was able to appropriately feed herself when foods or liquids handed to her. She would verbally respond "mmmm hmmm" but no evidence of meaningful responses and patient only attending to task at hand with moderate cues to direct and redirect attention. She was not exhibiting left gaze preference today and at rest, head was in neutral position. She would drop objects she was holding if not paying adequate attention to them. SLP is recommending to continue with Dys 2 solids but plan trials of advanced solids this week.     HPI HPI: Pt is a 61 y.o. female who presented to the ED on 06/20/21 after being found down by EMS. Left gaze, posturing, and hypoxia the 60's noted in the ED and pt intubated. ETT 5/28-5/31 CT head and CTA revealed large SAH with midline shift concerning for left MCA aneurysm rupture. 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 No PMH in chart.      SLP Plan  Continue with current plan of care      Recommendations for follow up therapy are one component of a multi-disciplinary discharge planning  process, led by the attending physician.  Recommendations may be updated based on patient status, additional functional criteria and insurance authorization.    Recommendations  Diet recommendations: Dysphagia 2 (fine chop);Thin liquid Liquids provided via: Cup;Straw Medication Administration: Whole meds with puree Supervision: Staff to assist with self feeding;Full supervision/cueing for compensatory strategies Compensations: Slow rate;Small sips/bites;Minimize environmental distractions Postural Changes and/or Swallow Maneuvers: Seated upright 90 degrees                Oral Care Recommendations: Oral care BID;Staff/trained caregiver to provide oral care Follow Up Recommendations: Skilled nursing-short term rehab (<3 hours/day) Assistance recommended at discharge: Frequent or constant Supervision/Assistance SLP Visit Diagnosis: Dysphagia, oropharyngeal phase (R13.12);Aphasia (R47.01) Plan: Continue with current plan of care         Angela Nevin, MA, CCC-SLP Speech Therapy

## 2021-07-13 NOTE — TOC Progression Note (Signed)
Transition of Care [facility]) - Progression Note    Patient Details  Name: Yolanda Boone MRN: [id number] Date of Birth: Apr 07, 1960  [facility] CM/SW Contact  Kermit Balo, RN Phone Number: 07/13/2021, 1:44 PM  Clinical Narrative:    CM has called the admissions of [facility] twice today and left a voicemail without speaking to anyone. CM has updated the patients daughter and she has given permission for pt to also be faxed to [employer] [facility] [employer] [facility].  TOC following.   Expected Discharge Plan: Skilled Nursing Facility Barriers to Discharge: Continued Medical Work up  Expected Discharge Plan and Services Expected Discharge Plan: Skilled Nursing Facility In-house Referral: Clinical Social Work   Post Acute Care Choice: Skilled Nursing Facility Living arrangements for the past 2 months: Single Family Home                                       Social Determinants of Health (SDOH) Interventions    Readmission Risk Interventions     No data to display

## 2021-07-14 LAB — GLUCOSE, CAPILLARY
Glucose-Capillary: 106 mg/dL — ABNORMAL HIGH (ref 70–99)
Glucose-Capillary: 80 mg/dL (ref 70–99)
Glucose-Capillary: 90 mg/dL (ref 70–99)
Glucose-Capillary: 95 mg/dL (ref 70–99)
Glucose-Capillary: 96 mg/dL (ref 70–99)
Glucose-Capillary: 98 mg/dL (ref 70–99)

## 2021-07-14 LAB — CBC
HCT: 32.3 % — ABNORMAL LOW (ref 36.0–46.0)
Hemoglobin: 10.2 g/dL — ABNORMAL LOW (ref 12.0–15.0)
MCH: 27.9 pg (ref 26.0–34.0)
MCHC: 31.6 g/dL (ref 30.0–36.0)
MCV: 88.3 fL (ref 80.0–100.0)
Platelets: 348 10*3/uL (ref 150–400)
RBC: 3.66 MIL/uL — ABNORMAL LOW (ref 3.87–5.11)
RDW: 12 % (ref 11.5–15.5)
WBC: 5.9 10*3/uL (ref 4.0–10.5)
nRBC: 0 % (ref 0.0–0.2)

## 2021-07-14 NOTE — Progress Notes (Signed)
ANTICOAGULATION CONSULT NOTE - Follow Up Consult  Pharmacy Consult for Enoxaparin Indication:  acute bilateral DVT  No Known Allergies  Patient Measurements: Height: 5\' 7"  (170.2 cm) Weight: 103 kg (227 lb 1.2 oz) IBW/kg (Calculated) : 61.6 Enoxaparin Dosing Weight: 101.9 kg  Vital Signs: Temp: 97.9 F (36.6 C) (06/21 0326) Temp Source: Oral (06/21 0326) BP: 110/85 (06/21 0326) Pulse Rate: 79 (06/21 0326) Recent Labs    07/14/21 0600  HGB 10.2*  HCT 32.3*  PLT 348    Estimated Creatinine Clearance: 94.6 mL/min (by C-G formula based on SCr of 0.62 mg/dL).  Assessment: 64 yof admitted with ruptured brain aneurysm s/p angiogram with coil embolization on 06/21/21. SQ heparin for VTE prophylaxis begun on 06/28/21. Bilateral LE duplex on 07/03/21 revealed acute bilateral DVT. Pharmacy consulted to transition from SQ heparin to therapeutic Enoxaparin.   6/18: CBC and renal function stable. No signs or symptoms of bleeding reported.   Goal of Therapy:  Monitor platelets by anticoagulation protocol: Yes   Plan:  Continue Enoxaparin 100 mg SQ q12h. CBC Q72h while on Enoxaparin Monitor renal function Monitor for s/sx of bleeding.  7/18 BS, PharmD, BCPS Clinical Pharmacist 07/14/2021 7:07 AM  Contact: [phone] after 3 PM  "Be curious, not judgmental..." [phone]

## 2021-07-14 NOTE — Progress Notes (Signed)
Physical Therapy Treatment Patient Details Name: Yolanda Boone MRN: [id number] DOB: [birth year] Today's Date: 07/14/2021   History of Present Illness 61 y.o. female presents to [facility] hospital on 06/20/2021 after being found down by EMS. Pt with L gaze and posturing, hypoxic to 60s requiring emergent intubation 06/20/21-06/23/21 . CTH and CTA reveal large SAH with midline shift, concerning for L MCA aneurysm rupture. 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 No PMH on file.    PT Comments    Pt is progressing slowly towards goals of PT. Pt still currently presents with R hemiparesis and decreased motor activation on R even with functional activity, as well as decreased tolerance to mobility. Today's session focused on mobility progression and attempted a sit to stand transfer x2. Pt was total assist +2 for physical assist and safety, unable to stand pt due to decreased motor activation on R despite max cues. Pt was able to weight shift forward in preparation to stand voluntarily. Pt followed one-step commands ~25% of the time and required increased repetition of multimodal cues with no ability to follow multi-step commands. Pt was easily distractible by television and wrinkled bed sheet needing frequent redirection. D/c recommendations of SNF level rehab at d/c continue to be appropriate due to impairments above in order to maximize functional independence and safety. Will continue to follow acutely.   Recommendations for follow up therapy are one component of a multi-disciplinary discharge planning process, led by the attending physician.  Recommendations may be updated based on patient status, additional functional criteria and insurance authorization.  Follow Up Recommendations  Skilled nursing-short term rehab (<3 hours/day) Can patient physically be transported by private vehicle: No   Assistance Recommended at  Discharge Frequent or constant Supervision/Assistance  Patient can return home with the following Two people to help with walking and/or transfers;Two people to help with bathing/dressing/bathroom;Assistance with cooking/housework;Assistance with feeding;Direct supervision/assist for medications management;Direct supervision/assist for financial management;Assist for transportation;Help with stairs or ramp for entrance   Equipment Recommendations  Wheelchair (measurements PT);Wheelchair cushion (measurements PT);Hospital bed;Other (comment)    Recommendations for Other Services       Precautions / Restrictions       Mobility  Bed Mobility Overal bed mobility: Needs Assistance Bed Mobility: Rolling, Sidelying to Sit, Sit to Supine Rolling: Max assist, +2 for physical assistance Sidelying to sit: Max assist, +2 for physical assistance   Sit to supine: +2 for physical assistance, +2 for safety/equipment, Max assist   General bed mobility comments: pt requiring max multimodal cues to for use of rails and LE positioning, modA +2 for trunk elevation and RLE movement for safety and assistance, as well as to gain/maintain sitting balance. rolled from side to side for pericare and changing of bed pads due to smear BM.    Transfers Overall transfer level: Needs assistance Equipment used: Rolling walker (2 wheels) Transfers: Sit to/from Stand Sit to Stand: Total assist, +2 physical assistance, From elevated surface           General transfer comment: attempted 2 sit to stand transfers from EOB. pt able to lift from EOB by ~25% once with total assist +2 with blocking of RLE. attempted sit to stand transfers with RW and recliner chair, pt required max multimodal cues for hand placement on chair/RW and use of bed pad +2 for transfer.    Ambulation/Gait  Stairs             Wheelchair Mobility    Modified Rankin (Stroke Patients Only)       Balance  Overall balance assessment: Needs assistance Sitting-balance support: Bilateral upper extremity supported, Feet supported Sitting balance-Leahy Scale: Fair Sitting balance - Comments: able to sit EOB with BUE support with no LOB. Postural control: Posterior lean, Left lateral lean   Standing balance-Leahy Scale: Zero Standing balance comment: Not observed                            Cognition Arousal/Alertness: Lethargic, Awake/alert Behavior During Therapy: Flat affect Overall Cognitive Status: Difficult to assess Area of Impairment: Following commands, Attention, Safety/judgement, Awareness, Problem solving                   Current Attention Level: Focused   Following Commands: Follows one step commands inconsistently Safety/Judgement: Decreased awareness of safety, Decreased awareness of deficits Awareness: Intellectual Problem Solving: Slow processing, Difficulty sequencing, Requires verbal cues, Requires tactile cues General Comments: alert at beginning of session regressing to extremely lethargic post EOB mobility. pt follows one step commands ~25% of the time with no ability to follow multi-step commands. easily distractable by tv and wrinkled bed pad. no family present during session.        Exercises      General Comments        Pertinent Vitals/Pain      Home Living                          Prior Function            PT Goals (current goals can now be found in the care plan section) Acute Rehab PT Goals Patient Stated Goal: patient unable to state PT Goal Formulation: Patient unable to participate in goal setting Progress towards PT goals: Progressing toward goals (slowly)    Frequency    Min 3X/week      PT Plan Current plan remains appropriate    Co-evaluation              AM-PAC PT "6 Clicks" Mobility   Outcome Measure  Help needed turning from your back to your side while in a flat bed without using  bedrails?: Total Help needed moving from lying on your back to sitting on the side of a flat bed without using bedrails?: Total Help needed moving to and from a bed to a chair (including a wheelchair)?: Total Help needed standing up from a chair using your arms (e.g., wheelchair or bedside chair)?: Total Help needed to walk in hospital room?: Total Help needed climbing 3-5 steps with a railing? : Total 6 Click Score: 6    End of Session Equipment Utilized During Treatment: Gait belt Activity Tolerance: Patient limited by lethargy;Patient limited by fatigue Patient left: in bed;with call bell/phone within reach;with bed alarm set Nurse Communication: Mobility status PT Visit Diagnosis: Other abnormalities of gait and mobility (R26.89);Muscle weakness (generalized) (M62.81);Hemiplegia and hemiparesis Hemiplegia - Right/Left: Right Hemiplegia - dominant/non-dominant: Dominant Hemiplegia - caused by: Nontraumatic SAH     Time: 5102-5852 PT Time Calculation (min) (ACUTE ONLY): 36 min  Charges:  $Therapeutic Activity: 23-37 mins                     Melvyn Novas, MS, SPT Acute Rehabilitation Services Office: [phone]    Melvyn Novas  07/14/2021, 4:47 PM

## 2021-07-14 NOTE — TOC Progression Note (Signed)
Transition of Care [facility]) - Progression Note    Patient Details  Name: Mailee Klaas MRN: [id number] Date of Birth: 1960-03-31  Transition of Care [facility]) CM/SW Contact  Kermit Balo, RN Phone Number: 07/14/2021, 12:05 PM  Clinical Narrative:    Pts daughter has decided on [facility] for rehab. CM has asked the admissions person at [facility] to please start auth through her [employer].  TOC following.   Expected Discharge Plan: Skilled Nursing Facility Barriers to Discharge: Continued Medical Work up  Expected Discharge Plan and Services Expected Discharge Plan: Skilled Nursing Facility In-house Referral: Clinical Social Work   Post Acute Care Choice: Skilled Nursing Facility Living arrangements for the past 2 months: Single Family Home                                       Social Determinants of Health (SDOH) Interventions    Readmission Risk Interventions     No data to display

## 2021-07-14 NOTE — Plan of Care (Signed)
  Problem: Education: Goal: Knowledge of General Education information will improve Description: Including pain rating scale, medication(s)/side effects and non-pharmacologic comfort measures Outcome: Progressing   Problem: Health Behavior/Discharge Planning: Goal: Ability to manage health-related needs will improve Outcome: Progressing   Problem: Clinical Measurements: Goal: Ability to maintain clinical measurements within normal limits will improve Outcome: Progressing Goal: Will remain free from infection Outcome: Progressing Goal: Diagnostic test results will improve Outcome: Progressing Goal: Respiratory complications will improve Outcome: Progressing Goal: Cardiovascular complication will be avoided Outcome: Progressing   Problem: Activity: Goal: Risk for activity intolerance will decrease Outcome: Progressing   Problem: Nutrition: Goal: Adequate nutrition will be maintained Outcome: Progressing   Problem: Coping: Goal: Level of anxiety will decrease Outcome: Progressing   Problem: Elimination: Goal: Will not experience complications related to bowel motility Outcome: Progressing Goal: Will not experience complications related to urinary retention Outcome: Progressing   Problem: Pain Managment: Goal: General experience of comfort will improve Outcome: Progressing   Problem: Safety: Goal: Ability to remain free from injury will improve Outcome: Progressing   Problem: Skin Integrity: Goal: Risk for impaired skin integrity will decrease Outcome: Progressing   Problem: Education: Goal: Knowledge of disease or condition will improve Outcome: Progressing Goal: Knowledge of secondary prevention will improve (SELECT ALL) Outcome: Progressing Goal: Knowledge of patient specific risk factors will improve (INDIVIDUALIZE FOR PATIENT) Outcome: Progressing Goal: Individualized Educational Video(s) Outcome: Progressing   Problem: Coping: Goal: Will verbalize  positive feelings about self Outcome: Progressing   Problem: Health Behavior/Discharge Planning: Goal: Ability to manage health-related needs will improve Outcome: Progressing   Problem: Self-Care: Goal: Ability to participate in self-care as condition permits will improve Outcome: Progressing   Problem: Spontaneous Subarachnoid Hemorrhage Tissue Perfusion: Goal: Complications of Spontaneous Subarachnoid Hemorrhage will be minimized Outcome: Progressing

## 2021-07-14 NOTE — Plan of Care (Signed)
Pt is alert, but not verbal and does not follow commands. Pt has purewick in place, pericare completed. Pt had small smear bowel movement.    Problem: Education: Goal: Knowledge of General Education information will improve Description: Including pain rating scale, medication(s)/side effects and non-pharmacologic comfort measures Outcome: Progressing   Problem: Health Behavior/Discharge Planning: Goal: Ability to manage health-related needs will improve Outcome: Progressing   Problem: Clinical Measurements: Goal: Ability to maintain clinical measurements within normal limits will improve Outcome: Progressing Goal: Will remain free from infection Outcome: Progressing Goal: Diagnostic test results will improve Outcome: Progressing Goal: Respiratory complications will improve Outcome: Progressing Goal: Cardiovascular complication will be avoided Outcome: Progressing   Problem: Activity: Goal: Risk for activity intolerance will decrease Outcome: Progressing   Problem: Nutrition: Goal: Adequate nutrition will be maintained Outcome: Progressing   Problem: Coping: Goal: Level of anxiety will decrease Outcome: Progressing   Problem: Elimination: Goal: Will not experience complications related to bowel motility Outcome: Progressing Goal: Will not experience complications related to urinary retention Outcome: Progressing   Problem: Pain Managment: Goal: General experience of comfort will improve Outcome: Progressing   Problem: Skin Integrity: Goal: Risk for impaired skin integrity will decrease Outcome: Progressing   Problem: Education: Goal: Knowledge of disease or condition will improve Outcome: Progressing Goal: Knowledge of secondary prevention will improve (SELECT ALL) Outcome: Progressing Goal: Knowledge of patient specific risk factors will improve (INDIVIDUALIZE FOR PATIENT) Outcome: Progressing Goal: Individualized Educational Video(s) Outcome: Progressing    Problem: Safety: Goal: Ability to remain free from injury will improve Outcome: Progressing   Problem: Education: Goal: Knowledge of disease or condition will improve Outcome: Progressing Goal: Knowledge of secondary prevention will improve (SELECT ALL) Outcome: Progressing Goal: Knowledge of patient specific risk factors will improve (INDIVIDUALIZE FOR PATIENT) Outcome: Progressing Goal: Individualized Educational Video(s) Outcome: Progressing   Problem: Coping: Goal: Will verbalize positive feelings about self Outcome: Progressing   Problem: Health Behavior/Discharge Planning: Goal: Ability to manage health-related needs will improve Outcome: Progressing   Problem: Self-Care: Goal: Ability to participate in self-care as condition permits will improve Outcome: Progressing   Problem: Spontaneous Subarachnoid Hemorrhage Tissue Perfusion: Goal: Complications of Spontaneous Subarachnoid Hemorrhage will be minimized Outcome: Progressing

## 2021-07-14 NOTE — Progress Notes (Signed)
  NEUROSURGERY PROGRESS NOTE   Pt seen and examined. No issues overnight.   EXAM: Temp:  [97.8 F (36.6 C)-98.5 F (36.9 C)] 97.9 F (36.6 C) (06/21 0326) Pulse Rate:  [79-105] 79 (06/21 0326) Resp:  [16-18] 17 (06/21 0326) BP: (103-114)/(65-85) 110/85 (06/21 0326) SpO2:  [98 %-100 %] 98 % (06/20 2351) Intake/Output      06/20 0701 06/21 0700 06/21 0701 06/22 0700   P.O. 60    NG/GT     Total Intake(mL/kg) 60 (0.6)    Urine (mL/kg/hr) 650 (0.3)    Stool 0    Total Output 650    Net -590         Urine Occurrence 2 x 1 x   Stool Occurrence 3 x 1 x    Awake, alert Can say single words Nods to questions Face symmetric Not-following commands Moves BUE/BLE purposefully  LABS: Lab Results  Component Value Date   WBC 5.9 07/14/2021   HGB 10.2 (L) 07/14/2021   HCT 32.3 (L) 07/14/2021   MCV 88.3 07/14/2021   PLT 348 07/14/2021      IMPRESSION: - 61 y.o. female SAH d#22 s/p coiling ruptured RMCA aneurysm. Right hemiparesis and aphasia are progressively improving - Bilateral DVT - Type B Aortic dissection - Multiple unruptured intracranial aneurysms  PLAN: - Cont supportive care - Cont PT/OT - Stable for transfer to SNF when placement is available - Lovenox for DVT    Lisbeth Renshaw, MD [facility]

## 2021-07-15 LAB — GLUCOSE, CAPILLARY
Glucose-Capillary: 107 mg/dL — ABNORMAL HIGH (ref 70–99)
Glucose-Capillary: 123 mg/dL — ABNORMAL HIGH (ref 70–99)
Glucose-Capillary: 123 mg/dL — ABNORMAL HIGH (ref 70–99)
Glucose-Capillary: 84 mg/dL (ref 70–99)
Glucose-Capillary: 89 mg/dL (ref 70–99)
Glucose-Capillary: 93 mg/dL (ref 70–99)

## 2021-07-15 NOTE — Progress Notes (Signed)
Speech Language Pathology Treatment: Dysphagia;Cognitive-Linquistic  Patient Details Name: Yolanda Boone MRN: [id number] DOB: [birth year] Today's Date: 07/15/2021 Time: 0102-7253 SLP Time Calculation (min) (ACUTE ONLY): 34 min  Assessment / Plan / Recommendation Clinical Impression  Pt upright in bed and alert, with daughter present at bedside. Daughter reports pt having no difficulties with dys 2 meal consumption this am. This date, pt self-fed regular textured solid and thin liquids by straw without signs of aspiration. With upper and lower denture placed, mastication and oral clearance of solids was adequate and timely. She independently took small bites and alternated solids with liquids. Recommend advance to dys 3 diet/thin liquids with adherence to universal swallow precautions. Above discussed with daughter and RN. Will f/u for tolerance.  Treatment for aphasia also targeted this am. Pt with few verbalizations/vocalizations, but which included "mmmhmmm", "uh-uh", and a few single words that were mumbled and unintelligible. Singing with daughter to elicit spontaneous verbal output was not effective. When asked simple biographical y/n questions, pt perseverated on "mmhmm". Max verbal/visual cues for use of gestures (head nod/shake, thumbs up/down) provided to encourage alternative means of indicating y/n. Pt unable to imitate. When daughter attempted washing pt's face, she did turn her head away and verbalize "uh-uh" indicating "no". Pt turned head/gaze toward named objects in room during receptive naming task with 80% accuracy. Educated daughter on modeling gestures during communication and encouraging pt to point to what she needs/wants. SLP to continue f/u.   HPI HPI: Pt is a 61 y.o. female who presented to the ED on 06/20/21 after being found down by EMS. Left gaze, posturing, and hypoxia the 60's noted in the ED and pt intubated. ETT 5/28-5/31 CT head and CTA revealed large SAH with midline  shift concerning for left MCA aneurysm rupture. 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 No PMH in chart.      SLP Plan  Continue with current plan of care      Recommendations for follow up therapy are one component of a multi-disciplinary discharge planning process, led by the attending physician.  Recommendations may be updated based on patient status, additional functional criteria and insurance authorization.    Recommendations  Diet recommendations: Dysphagia 3 (mechanical soft);Thin liquid Liquids provided via: Cup;Straw Medication Administration: Whole meds with puree Supervision: Staff to assist with self feeding;Full supervision/cueing for compensatory strategies Compensations: Slow rate;Small sips/bites;Minimize environmental distractions Postural Changes and/or Swallow Maneuvers: Seated upright 90 degrees                Oral Care Recommendations: Oral care BID;Staff/trained caregiver to provide oral care Follow Up Recommendations: Skilled nursing-short term rehab (<3 hours/day) Assistance recommended at discharge: Frequent or constant Supervision/Assistance SLP Visit Diagnosis: Dysphagia, oropharyngeal phase (R13.12);Aphasia (R47.01) Plan: Continue with current plan of care           Avie Echevaria, MA, CCC-SLP Acute Rehabilitation Services Office Number: [phone]   Yolanda Boone  07/15/2021, 10:05 AM

## 2021-07-15 NOTE — Plan of Care (Signed)
Pt is not verbal, does not follow commands. Pt has purewick in place for urine output. Pts family present bedside. Pt repositioned in bed. No distress noted.    Problem: Education: Goal: Knowledge of General Education information will improve Description: Including pain rating scale, medication(s)/side effects and non-pharmacologic comfort measures Outcome: Progressing   Problem: Health Behavior/Discharge Planning: Goal: Ability to manage health-related needs will improve Outcome: Progressing   Problem: Clinical Measurements: Goal: Ability to maintain clinical measurements within normal limits will improve Outcome: Progressing Goal: Will remain free from infection Outcome: Progressing Goal: Diagnostic test results will improve Outcome: Progressing Goal: Respiratory complications will improve Outcome: Progressing Goal: Cardiovascular complication will be avoided Outcome: Progressing   Problem: Activity: Goal: Risk for activity intolerance will decrease Outcome: Progressing   Problem: Nutrition: Goal: Adequate nutrition will be maintained Outcome: Progressing   Problem: Elimination: Goal: Will not experience complications related to bowel motility Outcome: Progressing Goal: Will not experience complications related to urinary retention Outcome: Progressing   Problem: Pain Managment: Goal: General experience of comfort will improve Outcome: Progressing   Problem: Safety: Goal: Ability to remain free from injury will improve Outcome: Progressing   Problem: Skin Integrity: Goal: Risk for impaired skin integrity will decrease Outcome: Progressing   Problem: Education: Goal: Knowledge of disease or condition will improve Outcome: Progressing Goal: Knowledge of secondary prevention will improve (SELECT ALL) Outcome: Progressing Goal: Knowledge of patient specific risk factors will improve (INDIVIDUALIZE FOR PATIENT) Outcome: Progressing Goal: Individualized  Educational Video(s) Outcome: Progressing   Problem: Coping: Goal: Will verbalize positive feelings about self Outcome: Progressing   Problem: Health Behavior/Discharge Planning: Goal: Ability to manage health-related needs will improve Outcome: Progressing   Problem: Self-Care: Goal: Ability to participate in self-care as condition permits will improve Outcome: Progressing   Problem: Spontaneous Subarachnoid Hemorrhage Tissue Perfusion: Goal: Complications of Spontaneous Subarachnoid Hemorrhage will be minimized Outcome: Progressing

## 2021-07-15 NOTE — Progress Notes (Signed)
Nutrition Follow-up  DOCUMENTATION CODES:  Obesity unspecified  INTERVENTION:  Continue current diet as ordered, encourage PO intake Hormel shake and magic cup with all meals  Ensure Enlive po TID, each supplement provides 350 kcal and 20 grams of protein. Prefers strawberry MVI with minerals BID to meet bariatric guidelines Calcium citrate with vitamin D 500 mg TID to meet bariatric guidelines   NUTRITION DIAGNOSIS:  Inadequate oral intake related to dysphagia as evidenced by meal completion < 50%. Ongoing.   GOAL:  Patient will meet greater than or equal to 90% of their needs Not met.   MONITOR:  PO intake, Diet advancement, TF tolerance  REASON FOR ASSESSMENT:  Consult Enteral/tube feeding initiation and management  ASSESSMENT:  Pt with PMH of HTN, thoracic aneurysm, gastric bypass, and grave's dz who was admitted with [facility] s/p coiling of ruptured R MCA aneurysm.   5/30 s/p cortrak placement, per xray tip in gastric body, question if this is the gastric pouch nothing noted about gastric bypass in xray.  6/1 diet advanced to Dysphagia 1 with thin liquids; ate 100% of dinner 6/2 aspiration event, NPO and TF held 6/3 TF restarted 6/6 s/p MBS started on Dysphagia diet with nectar thickened liquids  6/16 cortrak removed 6/22 SLP upgraded to DYS 3 with thin liquids  Pt resting in bed at the time of assessment. Daughter at bedside assisting with lunch. Diet was upgraded today and intake trends do appear to be trending up. Daughter reports that she also thinks pt has made progress. Does well with the ensure enlive and is eating some of her meals as well. Likely will be discharged tomorrow.  Of note, after results of kcal count, g-tube was recommended. However, after consulting with surgery team it was determined to send pt to SNF and trial meeting needs orally as pt is not a PEG candidate due to her hx of gastric bypass and would require open surgery to have tube placed. If pt fails  to meet nutrition needs and is re-admitted with poor PO or FTT, will revisit the topic of a g-tube.  Average Meal Intake: 6/8-6/14: 27% intake x 12 recorded meals 6/15-6/21: 57% intake x 7 recorded meals  Nutritionally Relevant Medications: Scheduled Meds:  calcium-vitamin D  1 tablet Oral TID   feeding supplement  237 mL Oral BID BM   multivitamin with minerals  1 tablet Oral BID   PRN Meds: ondansetron  Labs Reviewed  Diet Order:   Diet Order             DIET DYS 2 Room service appropriate? Yes; Fluid consistency: Thin  Diet effective now                   EDUCATION NEEDS:  No education needs have been identified at this time  Skin:  Skin Assessment: Reviewed RN Assessment  Last BM:  6/20  Height:  Ht Readings from Last 1 Encounters:  06/20/21 _0  (1.702 m)   Weight:  Wt Readings from Last 1 Encounters:  07/12/21 103 kg   BMI:  Body mass index is 35.56 kg/m.  Estimated Nutritional Needs:  Kcal:  1800-2000 Protein:  95-120 grams Fluid:  >1.8 L/day   Ranell Patrick, RD, LDN Clinical Dietitian RD pager # available in [facility]  After hours/weekend pager # available in [facility]

## 2021-07-15 NOTE — Progress Notes (Signed)
  NEUROSURGERY PROGRESS NOTE   Pt seen and examined. No issues overnight.   EXAM: Temp:  [97.5 F (36.4 C)-99.6 F (37.6 C)] 98.1 F (36.7 C) (06/22 1130) Pulse Rate:  [80-106] 96 (06/22 1130) Resp:  [16-20] 18 (06/22 1130) BP: (99-124)/(68-94) 124/80 (06/22 1130) SpO2:  [97 %-100 %] 99 % (06/22 1130) Intake/Output      06/21 0701 06/22 0700 06/22 0701 06/23 0700   P.O. 362    Total Intake(mL/kg) 362 (3.5)    Urine (mL/kg/hr)     Stool     Total Output     Net +362         Urine Occurrence 1 x    Stool Occurrence 2 x     Awake, alert Can say single words Nods to questions Face symmetric Moves BUE/BLE purposefully  LABS: Lab Results  Component Value Date   WBC 5.9 07/14/2021   HGB 10.2 (L) 07/14/2021   HCT 32.3 (L) 07/14/2021   MCV 88.3 07/14/2021   PLT 348 07/14/2021      IMPRESSION: - 61 y.o. female SAH d#23 s/p coiling ruptured RMCA aneurysm. Right hemiparesis and aphasia are progressively improving - Bilateral DVT - Type B Aortic dissection - Multiple unruptured intracranial aneurysms  PLAN: - Cont supportive care - Cont PT/OT - Stable for transfer to SNF when placement is available - Lovenox for DVT    Lisbeth Renshaw, MD [facility]

## 2021-07-15 NOTE — Plan of Care (Signed)
  Problem: Health Behavior/Discharge Planning: Goal: Ability to manage health-related needs will improve Outcome: Progressing   Problem: Clinical Measurements: Goal: Ability to maintain clinical measurements within normal limits will improve Outcome: Progressing Goal: Will remain free from infection Outcome: Progressing Goal: Diagnostic test results will improve Outcome: Progressing Goal: Respiratory complications will improve Outcome: Progressing Goal: Cardiovascular complication will be avoided Outcome: Progressing   Problem: Activity: Goal: Risk for activity intolerance will decrease Outcome: Progressing   Problem: Nutrition: Goal: Adequate nutrition will be maintained Outcome: Progressing   Problem: Coping: Goal: Level of anxiety will decrease Outcome: Progressing   Problem: Elimination: Goal: Will not experience complications related to bowel motility Outcome: Progressing Goal: Will not experience complications related to urinary retention Outcome: Progressing   Problem: Pain Managment: Goal: General experience of comfort will improve Outcome: Progressing   Problem: Safety: Goal: Ability to remain free from injury will improve Outcome: Progressing   Problem: Skin Integrity: Goal: Risk for impaired skin integrity will decrease Outcome: Progressing   Problem: Health Behavior/Discharge Planning: Goal: Ability to manage health-related needs will improve Outcome: Progressing   Problem: Self-Care: Goal: Ability to participate in self-care as condition permits will improve Outcome: Progressing   Problem: Spontaneous Subarachnoid Hemorrhage Tissue Perfusion: Goal: Complications of Spontaneous Subarachnoid Hemorrhage will be minimized Outcome: Progressing   

## 2021-07-16 LAB — GLUCOSE, CAPILLARY
Glucose-Capillary: 100 mg/dL — ABNORMAL HIGH (ref 70–99)
Glucose-Capillary: 102 mg/dL — ABNORMAL HIGH (ref 70–99)

## 2021-07-16 MED ORDER — ENOXAPARIN SODIUM 100 MG/ML IJ SOSY: 100.0000 mg | PREFILLED_SYRINGE | Freq: Two times a day (BID) | INTRAMUSCULAR | Status: DC

## 2021-07-16 NOTE — Progress Notes (Signed)
Called report to nurse Delight Hoh at [facility]. All questions answered. Awaiting for transport.

## 2021-10-14 ENCOUNTER — Other Ambulatory Visit (INDEPENDENT_AMBULATORY_CARE_PROVIDER_SITE_OTHER): Payer: Self-pay | Admitting: Nurse Practitioner

## 2021-10-14 DIAGNOSIS — I739 Peripheral vascular disease, unspecified: Secondary | ICD-10-CM

## 2021-10-15 ENCOUNTER — Encounter (INDEPENDENT_AMBULATORY_CARE_PROVIDER_SITE_OTHER): Payer: BC Managed Care – PPO | Admitting: Vascular Surgery

## 2021-10-15 ENCOUNTER — Encounter (INDEPENDENT_AMBULATORY_CARE_PROVIDER_SITE_OTHER): Payer: BC Managed Care – PPO

## 2021-12-21 ENCOUNTER — Encounter (INDEPENDENT_AMBULATORY_CARE_PROVIDER_SITE_OTHER): Payer: Self-pay

## 2021-12-21 ENCOUNTER — Encounter (INDEPENDENT_AMBULATORY_CARE_PROVIDER_SITE_OTHER): Payer: Self-pay | Admitting: Vascular Surgery

## 2022-01-04 ENCOUNTER — Other Ambulatory Visit (HOSPITAL_COMMUNITY): Payer: Self-pay | Admitting: Neurosurgery

## 2022-01-04 ENCOUNTER — Other Ambulatory Visit: Payer: Self-pay | Admitting: Neurosurgery

## 2022-01-04 DIAGNOSIS — I671 Cerebral aneurysm, nonruptured: Secondary | ICD-10-CM

## 2022-01-06 ENCOUNTER — Other Ambulatory Visit: Payer: Self-pay | Admitting: Neurosurgery

## 2022-01-06 NOTE — Pre-Procedure Instructions (Signed)
Surgical Instructions    Your procedure is scheduled on January 13, 2022.  Report to [facility] Main Entrance "A" at 11:00 A.M., then check in with the Admitting office.  Call this number if you have problems the morning of surgery:  [phone]   If you have any questions prior to your surgery date call [phone]: Open Monday-Friday 8am-4pm    Remember:  Do not eat after midnight the night before your surgery  You may drink clear liquids until 10:00 AM the morning of your surgery.   Clear liquids allowed are: Water, Non-Citrus Juices (without pulp), Carbonated Beverages, Clear Tea, Black Coffee Only (NO MILK, CREAM OR POWDERED CREAMER of any kind), and Gatorade.     Take these medicines the morning of surgery with A SIP OF WATER:  amLODipine (NORVASC)   carvedilol (COREG)   fluticasone (FLONASE)   acetaminophen (TYLENOL) - may take if needed  methocarbamol (ROBAXIN) - may take if needed   Follow your surgeon's instructions on when to stop Aspirin and BRILINTA.  If no instructions were given by your surgeon then you will need to call the office to get those instructions.     As of today, STOP taking any Aleve, Naproxen, Ibuprofen, Motrin, Advil, Goody's, BC's, all herbal medications, fish oil, and all vitamins.                     Do NOT Smoke (Tobacco/Vaping) for 24 hours prior to your procedure.  If you use a CPAP at night, you may bring your mask/headgear for your overnight stay.   Contacts, glasses, piercing's, hearing aid's, dentures or partials may not be worn into surgery, please bring cases for these belongings.    For patients admitted to the hospital, discharge time will be determined by your treatment team.   Patients discharged the day of surgery will not be allowed to drive home, and someone needs to stay with them for 24 hours.  SURGICAL WAITING ROOM VISITATION Patients having surgery or a procedure may have no more than 2 support people in the waiting  area - these visitors may rotate.   Children under the age of 47 must have an adult with them who is not the patient. If the patient needs to stay at the hospital during part of their recovery, the visitor guidelines for inpatient rooms apply. Pre-op nurse will coordinate an appropriate time for 1 support person to accompany patient in pre-op.  This support person may not rotate.   Please refer to the [facility] website for the visitor guidelines for Inpatients (after your surgery is over and you are in a regular room).    Special instructions:   [place]- Preparing For Surgery  Before surgery, you can play an important role. Because skin is not sterile, your skin needs to be as free of germs as possible. You can reduce the number of germs on your skin by washing with CHG (chlorahexidine gluconate) Soap before surgery.  CHG is an antiseptic cleaner which kills germs and bonds with the skin to continue killing germs even after washing.    Oral Hygiene is also important to reduce your risk of infection.  Remember - BRUSH YOUR TEETH THE MORNING OF SURGERY WITH YOUR REGULAR TOOTHPASTE  Please do not use if you have an allergy to CHG or antibacterial soaps. If your skin becomes reddened/irritated stop using the CHG.  Do not shave (including legs and underarms) for at least 48 hours prior to first CHG  shower. It is OK to shave your face.  Please follow these instructions carefully.   Shower the NIGHT BEFORE SURGERY and the MORNING OF SURGERY  If you chose to wash your hair, wash your hair first as usual with your normal shampoo.  After you shampoo, rinse your hair and body thoroughly to remove the shampoo.  Use CHG Soap as you would any other liquid soap. You can apply CHG directly to the skin and wash gently with a scrungie or a clean washcloth.   Apply the CHG Soap to your body ONLY FROM THE NECK DOWN.  Do not use on open wounds or open sores. Avoid contact with your eyes, ears, mouth and  genitals (private parts). Wash Face and genitals (private parts)  with your normal soap.   Wash thoroughly, paying special attention to the area where your surgery will be performed.  Thoroughly rinse your body with warm water from the neck down.  DO NOT shower/wash with your normal soap after using and rinsing off the CHG Soap.  Pat yourself dry with a CLEAN TOWEL.  Wear CLEAN PAJAMAS to bed the night before surgery  Place CLEAN SHEETS on your bed the night before your surgery  DO NOT SLEEP WITH PETS.   Day of Surgery: Take a shower with CHG soap. Do not wear jewelry or makeup Do not wear lotions, powders, perfumes/colognes, or deodorant. Do not shave 48 hours prior to surgery.  Men may shave face and neck. Do not bring valuables to the hospital.  [facility] is not responsible for any belongings or valuables. Do not wear nail polish, gel polish, artificial nails, or any other type of covering on natural nails (fingers and toes) If you have artificial nails or gel coating that need to be removed by a nail salon, please have this removed prior to surgery. Artificial nails or gel coating may interfere with anesthesia's ability to adequately monitor your vital signs.  Wear Clean/Comfortable clothing the morning of surgery Remember to brush your teeth WITH YOUR REGULAR TOOTHPASTE.   Please read over the following fact sheets that you were given.    If you received a COVID test during your pre-op visit  it is requested that you wear a mask when out in public, stay away from anyone that may not be feeling well and notify your surgeon if you develop symptoms. If you have been in contact with anyone that has tested positive in the last 10 days please notify you surgeon.

## 2022-01-07 ENCOUNTER — Encounter (HOSPITAL_COMMUNITY)
Admission: RE | Admit: 2022-01-07 | Discharge: 2022-01-07 | Disposition: A | Discharge status: Home / Self Care | Payer: BC Managed Care – PPO | Source: Ambulatory Visit | Attending: Neurosurgery | Admitting: Neurosurgery

## 2022-01-07 ENCOUNTER — Other Ambulatory Visit: Payer: Self-pay

## 2022-01-07 ENCOUNTER — Encounter (HOSPITAL_COMMUNITY): Payer: Self-pay

## 2022-01-07 DIAGNOSIS — J45909 Unspecified asthma, uncomplicated: Secondary | ICD-10-CM | POA: Diagnosis not present

## 2022-01-07 DIAGNOSIS — I671 Cerebral aneurysm, nonruptured: Secondary | ICD-10-CM | POA: Diagnosis not present

## 2022-01-07 DIAGNOSIS — Z01812 Encounter for preprocedural laboratory examination: Secondary | ICD-10-CM | POA: Insufficient documentation

## 2022-01-07 DIAGNOSIS — E0500 Thyrotoxicosis with diffuse goiter without thyrotoxic crisis or storm: Secondary | ICD-10-CM | POA: Insufficient documentation

## 2022-01-07 DIAGNOSIS — I10 Essential (primary) hypertension: Secondary | ICD-10-CM | POA: Diagnosis not present

## 2022-01-07 DIAGNOSIS — Z86718 Personal history of other venous thrombosis and embolism: Secondary | ICD-10-CM | POA: Insufficient documentation

## 2022-01-07 DIAGNOSIS — E669 Obesity, unspecified: Secondary | ICD-10-CM | POA: Insufficient documentation

## 2022-01-07 DIAGNOSIS — Q278 Other specified congenital malformations of peripheral vascular system: Secondary | ICD-10-CM | POA: Insufficient documentation

## 2022-01-07 DIAGNOSIS — I7100 Dissection of unspecified site of aorta: Secondary | ICD-10-CM | POA: Diagnosis not present

## 2022-01-07 DIAGNOSIS — Z6833 Body mass index (BMI) 33.0-33.9, adult: Secondary | ICD-10-CM | POA: Diagnosis not present

## 2022-01-07 HISTORY — DX: Unspecified asthma, uncomplicated: J45.909

## 2022-01-07 HISTORY — DX: Essential (primary) hypertension: I10

## 2022-01-07 HISTORY — DX: Thyrotoxicosis with diffuse goiter without thyrotoxic crisis or storm: E05.00

## 2022-01-07 HISTORY — DX: Unspecified osteoarthritis, unspecified site: M19.90

## 2022-01-07 LAB — CBC WITH DIFFERENTIAL/PLATELET
Abs Immature Granulocytes: 0.02 10*3/uL (ref 0.00–0.07)
Basophils Absolute: 0 10*3/uL (ref 0.0–0.1)
Basophils Relative: 0 %
Eosinophils Absolute: 0.1 10*3/uL (ref 0.0–0.5)
Eosinophils Relative: 1 %
HCT: 38 % (ref 36.0–46.0)
Hemoglobin: 12.3 g/dL (ref 12.0–15.0)
Immature Granulocytes: 0 %
Lymphocytes Relative: 33 %
Lymphs Abs: 2.2 10*3/uL (ref 0.7–4.0)
MCH: 28.4 pg (ref 26.0–34.0)
MCHC: 32.4 g/dL (ref 30.0–36.0)
MCV: 87.8 fL (ref 80.0–100.0)
Monocytes Absolute: 0.6 10*3/uL (ref 0.1–1.0)
Monocytes Relative: 9 %
Neutro Abs: 3.7 10*3/uL (ref 1.7–7.7)
Neutrophils Relative %: 57 %
Platelets: 286 10*3/uL (ref 150–400)
RBC: 4.33 MIL/uL (ref 3.87–5.11)
RDW: 15 % (ref 11.5–15.5)
WBC: 6.7 10*3/uL (ref 4.0–10.5)
nRBC: 0 % (ref 0.0–0.2)

## 2022-01-07 LAB — BASIC METABOLIC PANEL
Anion gap: 8 (ref 5–15)
BUN: 10 mg/dL (ref 6–20)
CO2: 26 mmol/L (ref 22–32)
Calcium: 8.9 mg/dL (ref 8.9–10.3)
Chloride: 105 mmol/L (ref 98–111)
Creatinine, Ser: 0.6 mg/dL (ref 0.44–1.00)
GFR, Estimated: >60 mL/min (ref >60)
Glucose, Bld: 99 mg/dL (ref 70–99)
Potassium: 3.4 mmol/L — ABNORMAL LOW (ref 3.5–5.1)
Sodium: 139 mmol/L (ref 135–145)

## 2022-01-07 LAB — APTT: aPTT: 31 seconds (ref 24–36)

## 2022-01-07 LAB — PROTIME-INR
INR: 1.2 (ref 0.8–1.2)
Prothrombin Time: 14.6 seconds (ref 11.4–15.2)

## 2022-01-07 NOTE — Progress Notes (Signed)
[facility] Cardi[facility] - denies   Pt saw Dr. Pattricia Boss (Vascular) r/t aortic aneurysm  PPM/ICD - denies   Chest x-ray - 07/02/21 EKG - 07/01/21 Stress Test - denies ECHO - 06/21/21 Cardiac Cath - 06/07/21  Sleep Study - denies   DM- denies    Blood Thinner Instructions: f/u with surgeon about whether to take Brilinta on DOS Aspirin Instructions: f/u with surgeon  ERAS Protcol - yes, no drink   COVID TEST- no   Anesthesia review: yes, pt was diagnosed with an aortic aneurysm. She saw Dr. Pattricia Boss (Vascular) about this. This plan for repair was interrupted by CVA that occurred 06/2021. Pt and pt's daughter state that she never followed up about the aortic aneurysm after the CVA occurred. Pt and daughter say that she doesn't currently see anyone in Cardiology.   Patient denies shortness of breath, fever, cough and chest pain at PAT appointment   All instructions explained to the patient, with a verbal understanding of the material. Patient agrees to go over the instructions while at home for a better understanding. The opportunity to ask questions was provided.

## 2022-01-10 ENCOUNTER — Encounter (HOSPITAL_COMMUNITY): Payer: Self-pay | Admitting: Vascular Surgery

## 2022-01-10 ENCOUNTER — Encounter (HOSPITAL_COMMUNITY): Payer: Self-pay
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
## 2022-01-10 NOTE — Anesthesia Preprocedure Evaluation (Deleted)
Anesthesia Evaluation    Airway        Dental   Pulmonary           Cardiovascular hypertension,      Neuro/Psych    GI/Hepatic   Endo/Other    Renal/GU      Musculoskeletal   Abdominal   Peds  Hematology   Anesthesia Other Findings   Reproductive/Obstetrics                             Anesthesia Physical Anesthesia Plan  ASA:   Anesthesia Plan:    Post-op Pain Management:    Induction:   PONV Risk Score and Plan:   Airway Management Planned:   Additional Equipment:   Intra-op Plan:   Post-operative Plan:   Informed Consent:   Plan Discussed with:   Anesthesia Plan Comments: (See PAT note written 01/10/2022 by Shonna Chock, PA-C.  )       Anesthesia Quick Evaluation

## 2022-01-13 ENCOUNTER — Inpatient Hospital Stay (HOSPITAL_COMMUNITY)
Admission: RE | Admit: 2022-01-13 | Discharge: 2022-01-14 | DRG: 252 | Disposition: A | Discharge status: Home / Self Care | Payer: BC Managed Care – PPO | Source: Ambulatory Visit | Attending: Neurosurgery | Admitting: Neurosurgery

## 2022-01-13 ENCOUNTER — Encounter (HOSPITAL_COMMUNITY): Payer: Self-pay | Admitting: Neurosurgery

## 2022-01-13 ENCOUNTER — Other Ambulatory Visit: Payer: Self-pay

## 2022-01-13 ENCOUNTER — Encounter (HOSPITAL_COMMUNITY)
Admission: RE | Disposition: A | Discharge status: Home / Self Care | Payer: Self-pay | Source: Ambulatory Visit | Attending: Neurosurgery

## 2022-01-13 ENCOUNTER — Inpatient Hospital Stay (HOSPITAL_COMMUNITY): Payer: BC Managed Care – PPO | Admitting: Certified Registered Nurse Anesthetist

## 2022-01-13 ENCOUNTER — Inpatient Hospital Stay (HOSPITAL_COMMUNITY)
Admission: RE | Admit: 2022-01-13 | Discharge: 2022-01-13 | Disposition: A | Discharge status: Home / Self Care | Payer: BC Managed Care – PPO | Source: Ambulatory Visit | Attending: Neurosurgery | Admitting: Neurosurgery

## 2022-01-13 DIAGNOSIS — Z9884 Bariatric surgery status: Secondary | ICD-10-CM | POA: Diagnosis not present

## 2022-01-13 DIAGNOSIS — I671 Cerebral aneurysm, nonruptured: Secondary | ICD-10-CM | POA: Diagnosis present

## 2022-01-13 DIAGNOSIS — Z90710 Acquired absence of both cervix and uterus: Secondary | ICD-10-CM

## 2022-01-13 DIAGNOSIS — I7100 Dissection of unspecified site of aorta: Secondary | ICD-10-CM | POA: Diagnosis present

## 2022-01-13 DIAGNOSIS — I69251 Hemiplegia and hemiparesis following other nontraumatic intracranial hemorrhage affecting right dominant side: Secondary | ICD-10-CM

## 2022-01-13 DIAGNOSIS — Z7951 Long term (current) use of inhaled steroids: Secondary | ICD-10-CM | POA: Diagnosis not present

## 2022-01-13 DIAGNOSIS — I69220 Aphasia following other nontraumatic intracranial hemorrhage: Secondary | ICD-10-CM

## 2022-01-13 DIAGNOSIS — J45909 Unspecified asthma, uncomplicated: Secondary | ICD-10-CM | POA: Diagnosis present

## 2022-01-13 DIAGNOSIS — I720 Aneurysm of carotid artery: Principal | ICD-10-CM | POA: Diagnosis present

## 2022-01-13 DIAGNOSIS — I10 Essential (primary) hypertension: Secondary | ICD-10-CM | POA: Diagnosis present

## 2022-01-13 DIAGNOSIS — Z7982 Long term (current) use of aspirin: Secondary | ICD-10-CM | POA: Diagnosis not present

## 2022-01-13 DIAGNOSIS — Z79899 Other long term (current) drug therapy: Secondary | ICD-10-CM

## 2022-01-13 HISTORY — PX: IR NEURO EACH ADD'L AFTER BASIC UNI RIGHT (MS): IMG5374

## 2022-01-13 HISTORY — PX: IR ANGIO INTRA EXTRACRAN SEL INTERNAL CAROTID UNI R MOD SED: IMG5362

## 2022-01-13 HISTORY — PX: IR TRANSCATH/EMBOLIZ: IMG695

## 2022-01-13 HISTORY — PX: RADIOLOGY WITH ANESTHESIA: SHX6223

## 2022-01-13 HISTORY — PX: IR ANGIO INTRA EXTRACRAN SEL COM CAROTID INNOMINATE UNI L MOD SED: IMG5358

## 2022-01-13 LAB — CBC
HCT: 37.1 % (ref 36.0–46.0)
Hemoglobin: 12.4 g/dL (ref 12.0–15.0)
MCH: 28.3 pg (ref 26.0–34.0)
MCHC: 33.4 g/dL (ref 30.0–36.0)
MCV: 84.7 fL (ref 80.0–100.0)
Platelets: 303 10*3/uL (ref 150–400)
RBC: 4.38 MIL/uL (ref 3.87–5.11)
RDW: 14.4 % (ref 11.5–15.5)
WBC: 20.8 10*3/uL — ABNORMAL HIGH (ref 4.0–10.5)
nRBC: 0 % (ref 0.0–0.2)

## 2022-01-13 LAB — CREATININE, SERUM
Creatinine, Ser: 0.61 mg/dL (ref 0.44–1.00)
GFR, Estimated: >60 mL/min (ref >60)

## 2022-01-13 SURGERY — IR WITH ANESTHESIA
Anesthesia: General

## 2022-01-13 MED ORDER — CARVEDILOL 3.125 MG PO TABS
6.2500 mg | ORAL_TABLET | Freq: Two times a day (BID) | ORAL | Status: DC
  Administered 2022-01-14: 6.25 mg via ORAL
  Filled 2022-01-13: qty 2

## 2022-01-13 MED ORDER — PROPOFOL 10 MG/ML IV BOLUS
INTRAVENOUS | Status: AC
  Filled 2022-01-13: qty 20

## 2022-01-13 MED ORDER — PHENYLEPHRINE HCL-NACL 20-0.9 MG/250ML-% IV SOLN
INTRAVENOUS | Status: DC | PRN
  Administered 2022-01-13: 25 ug/min via INTRAVENOUS

## 2022-01-13 MED ORDER — TICAGRELOR 90 MG PO TABS
90.0000 mg | ORAL_TABLET | Freq: Two times a day (BID) | ORAL | Status: DC
  Administered 2022-01-13 – 2022-01-14 (×2): 90 mg via ORAL
  Filled 2022-01-13 (×2): qty 1

## 2022-01-13 MED ORDER — ONDANSETRON HCL 4 MG/2ML IJ SOLN: 4.0000 mg | INTRAMUSCULAR | Status: DC | PRN

## 2022-01-13 MED ORDER — LABETALOL HCL 5 MG/ML IV SOLN: 10.0000 mg | INTRAVENOUS | Status: DC | PRN

## 2022-01-13 MED ORDER — AMLODIPINE BESYLATE 5 MG PO TABS
5.0000 mg | ORAL_TABLET | Freq: Every day | ORAL | Status: DC
  Administered 2022-01-14: 5 mg via ORAL
  Filled 2022-01-13: qty 1

## 2022-01-13 MED ORDER — CLEVIDIPINE BUTYRATE 0.5 MG/ML IV EMUL
INTRAVENOUS | Status: AC
  Filled 2022-01-13: qty 50

## 2022-01-13 MED ORDER — ONDANSETRON HCL 4 MG PO TABS: 4.0000 mg | ORAL_TABLET | ORAL | Status: DC | PRN

## 2022-01-13 MED ORDER — FENTANYL CITRATE (PF) 100 MCG/2ML IJ SOLN: 25.0000 ug | INTRAMUSCULAR | Status: DC | PRN

## 2022-01-13 MED ORDER — ONDANSETRON HCL 4 MG/2ML IJ SOLN
INTRAMUSCULAR | Status: DC | PRN
  Administered 2022-01-13: 4 mg via INTRAVENOUS

## 2022-01-13 MED ORDER — HEPARIN SODIUM (PORCINE) 1000 UNIT/ML IJ SOLN
INTRAMUSCULAR | Status: DC | PRN
  Administered 2022-01-13: 5000 [IU] via INTRAVENOUS

## 2022-01-13 MED ORDER — ORAL CARE MOUTH RINSE: 15.0000 mL | Freq: Once | OROMUCOSAL | Status: AC

## 2022-01-13 MED ORDER — FLUTICASONE PROPIONATE 50 MCG/ACT NA SUSP
2.0000 | Freq: Every day | NASAL | Status: DC
  Administered 2022-01-14: 2 via NASAL
  Filled 2022-01-13: qty 16

## 2022-01-13 MED ORDER — SUGAMMADEX SODIUM 200 MG/2ML IV SOLN
INTRAVENOUS | Status: DC | PRN
  Administered 2022-01-13: 200 mg via INTRAVENOUS

## 2022-01-13 MED ORDER — HEPARIN SODIUM (PORCINE) 5000 UNIT/ML IJ SOLN
5000.0000 [IU] | Freq: Three times a day (TID) | INTRAMUSCULAR | Status: DC
  Administered 2022-01-13 – 2022-01-14 (×2): 5000 [IU] via SUBCUTANEOUS
  Filled 2022-01-13 (×2): qty 1

## 2022-01-13 MED ORDER — SODIUM CHLORIDE 0.9 % IV SOLN: INTRAVENOUS | Status: DC

## 2022-01-13 MED ORDER — CLEVIDIPINE BUTYRATE 0.5 MG/ML IV EMUL
INTRAVENOUS | Status: DC | PRN
  Administered 2022-01-13: 2 mg/h via INTRAVENOUS

## 2022-01-13 MED ORDER — CHLORHEXIDINE GLUCONATE CLOTH 2 % EX PADS: 6.0000 | MEDICATED_PAD | Freq: Once | CUTANEOUS | Status: DC

## 2022-01-13 MED ORDER — LACTATED RINGERS IV SOLN: INTRAVENOUS | Status: DC

## 2022-01-13 MED ORDER — AMISULPRIDE (ANTIEMETIC) 5 MG/2ML IV SOLN
10.0000 mg | Freq: Once | INTRAVENOUS | Status: AC | PRN
  Administered 2022-01-13: 10 mg via INTRAVENOUS

## 2022-01-13 MED ORDER — DEXAMETHASONE SODIUM PHOSPHATE 10 MG/ML IJ SOLN
INTRAMUSCULAR | Status: DC | PRN
  Administered 2022-01-13: 10 mg via INTRAVENOUS

## 2022-01-13 MED ORDER — IOHEXOL 300 MG/ML  SOLN
150.0000 mL | Freq: Once | INTRAMUSCULAR | Status: AC | PRN
  Administered 2022-01-13: 55 mL via INTRA_ARTERIAL

## 2022-01-13 MED ORDER — ESMOLOL HCL 100 MG/10ML IV SOLN
INTRAVENOUS | Status: DC | PRN
  Administered 2022-01-13: 30 mg via INTRAVENOUS

## 2022-01-13 MED ORDER — LACTATED RINGERS IV SOLN: INTRAVENOUS | Status: DC | PRN

## 2022-01-13 MED ORDER — CEFAZOLIN SODIUM-DEXTROSE 2-4 GM/100ML-% IV SOLN
INTRAVENOUS | Status: AC
  Filled 2022-01-13: qty 100

## 2022-01-13 MED ORDER — ROCURONIUM BROMIDE 10 MG/ML (PF) SYRINGE
PREFILLED_SYRINGE | INTRAVENOUS | Status: DC | PRN
  Administered 2022-01-13: 60 mg via INTRAVENOUS
  Administered 2022-01-13: 20 mg via INTRAVENOUS

## 2022-01-13 MED ORDER — ASPIRIN 81 MG PO CHEW
81.0000 mg | CHEWABLE_TABLET | Freq: Every day | ORAL | Status: DC
  Administered 2022-01-14: 81 mg via ORAL
  Filled 2022-01-13: qty 1

## 2022-01-13 MED ORDER — LIDOCAINE 2% (20 MG/ML) 5 ML SYRINGE
INTRAMUSCULAR | Status: DC | PRN
  Administered 2022-01-13: 60 mg via INTRAVENOUS

## 2022-01-13 MED ORDER — HYDROCODONE-ACETAMINOPHEN 5-325 MG PO TABS: 1.0000 | ORAL_TABLET | ORAL | Status: DC | PRN

## 2022-01-13 MED ORDER — PANTOPRAZOLE SODIUM 40 MG IV SOLR
40.0000 mg | Freq: Every day | INTRAVENOUS | Status: DC
  Administered 2022-01-13: 40 mg via INTRAVENOUS
  Filled 2022-01-13: qty 10

## 2022-01-13 MED ORDER — PROPOFOL 10 MG/ML IV BOLUS
INTRAVENOUS | Status: DC | PRN
  Administered 2022-01-13: 50 mg via INTRAVENOUS
  Administered 2022-01-13: 200 mg via INTRAVENOUS

## 2022-01-13 MED ORDER — MORPHINE SULFATE (PF) 2 MG/ML IV SOLN: 1.0000 mg | INTRAVENOUS | Status: DC | PRN

## 2022-01-13 MED ORDER — OXYCODONE HCL 5 MG/5ML PO SOLN: 5.0000 mg | Freq: Once | ORAL | Status: DC | PRN

## 2022-01-13 MED ORDER — ONDANSETRON HCL 4 MG/2ML IJ SOLN: 4.0000 mg | Freq: Once | INTRAMUSCULAR | Status: DC | PRN

## 2022-01-13 MED ORDER — CHLORHEXIDINE GLUCONATE 0.12 % MT SOLN
15.0000 mL | Freq: Once | OROMUCOSAL | Status: AC
  Administered 2022-01-13: 15 mL via OROMUCOSAL
  Filled 2022-01-13: qty 15

## 2022-01-13 MED ORDER — PHENYLEPHRINE 80 MCG/ML (10ML) SYRINGE FOR IV PUSH (FOR BLOOD PRESSURE SUPPORT)
PREFILLED_SYRINGE | INTRAVENOUS | Status: DC | PRN
  Administered 2022-01-13: 80 ug via INTRAVENOUS
  Administered 2022-01-13: 40 ug via INTRAVENOUS
  Administered 2022-01-13: 80 ug via INTRAVENOUS

## 2022-01-13 MED ORDER — CEFAZOLIN SODIUM-DEXTROSE 2-4 GM/100ML-% IV SOLN
2.0000 g | INTRAVENOUS | Status: AC
  Administered 2022-01-13: 2 g via INTRAVENOUS

## 2022-01-13 MED ORDER — OXYCODONE HCL 5 MG PO TABS: 5.0000 mg | ORAL_TABLET | Freq: Once | ORAL | Status: DC | PRN

## 2022-01-13 MED ORDER — METHOCARBAMOL 500 MG PO TABS: 500.0000 mg | ORAL_TABLET | Freq: Four times a day (QID) | ORAL | Status: DC | PRN

## 2022-01-13 MED ORDER — FENTANYL CITRATE (PF) 250 MCG/5ML IJ SOLN
INTRAMUSCULAR | Status: DC | PRN
  Administered 2022-01-13: 150 ug via INTRAVENOUS

## 2022-01-13 MED ORDER — AMISULPRIDE (ANTIEMETIC) 5 MG/2ML IV SOLN
INTRAVENOUS | Status: AC
  Filled 2022-01-13: qty 4

## 2022-01-13 MED ORDER — FENTANYL CITRATE (PF) 250 MCG/5ML IJ SOLN
INTRAMUSCULAR | Status: AC
  Filled 2022-01-13: qty 5

## 2022-01-13 NOTE — Transfer of Care (Signed)
Immediate Anesthesia Transfer of Care Note  Patient: Yolanda Boone  Procedure(s) Performed: Arteriogram, Pipeline Embolization of Aneurysm  Patient Location: PACU  Anesthesia Type:General  Level of Consciousness: awake, drowsy, and patient cooperative  Airway & Oxygen Therapy: Patient Spontanous Breathing  Post-op Assessment: Report given to RN and Post -op Vital signs reviewed and stable  Post vital signs: Reviewed and stable  Last Vitals:  Vitals Value Taken Time  BP 130/79 01/13/22 1500  Temp    Pulse 87 01/13/22 1503  Resp 15 01/13/22 1503  SpO2 100 % 01/13/22 1503  Vitals shown include unvalidated device data.  Last Pain:  Vitals:   01/13/22 1222  PainSc: 0-No pain         Complications: No notable events documented.

## 2022-01-13 NOTE — Anesthesia Preprocedure Evaluation (Addendum)
Anesthesia Evaluation  Patient identified by MRN, date of birth, ID band Patient awake    Reviewed: Allergy & Precautions, NPO status , Patient's Chart, lab work & pertinent test results  History of Anesthesia Complications Negative for: history of anesthetic complications  Airway Mallampati: II  TM Distance: >3 FB Neck ROM: Full    Dental  (+) Upper Dentures, Lower Dentures, Dental Advisory Given   Pulmonary asthma    Pulmonary exam normal        Cardiovascular hypertension, + DVT  Normal cardiovascular exam  Type B aortic dissection and aberrant right SCA diagnosed 02/2021 with plans for intervention disrupted by ruptured cerebral aneurysm with large SAH   Neuro/Psych CVA, Residual Symptoms    GI/Hepatic negative GI ROS, Neg liver ROS,,,  Endo/Other   Hyperthyroidism   Renal/GU negative Renal ROS  negative genitourinary   Musculoskeletal negative musculoskeletal ROS (+)    Abdominal   Peds  Hematology negative hematology ROS (+)   Anesthesia Other Findings History includes never smoker, asthma, HTN, Graves disease/hyperthyroidism (s/p radiation 2005), 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 obesity. Luminal irregularities by 06/07/21 LHC.   Reproductive/Obstetrics                             Anesthesia Physical Anesthesia Plan  ASA: 3  Anesthesia Plan: General   Post-op Pain Management: Minimal or no pain anticipated   Induction: Intravenous  PONV Risk Score and Plan: 3 and Ondansetron, Dexamethasone and Treatment may vary due to age or medical condition  Airway Management Planned: Oral ETT  Additional Equipment: Arterial line  Intra-op Plan:   Post-operative Plan: Extubation in OR  Informed Consent: I have reviewed the patients History and Physical,  chart, labs and discussed the procedure including the risks, benefits and alternatives for the proposed anesthesia with the patient or authorized representative who has indicated his/her understanding and acceptance.     Dental advisory given  Plan Discussed with:   Anesthesia Plan Comments:        Anesthesia Quick Evaluation

## 2022-01-13 NOTE — Anesthesia Postprocedure Evaluation (Signed)
Anesthesia Post Note  Patient: Yolanda Boone  Procedure(s) Performed: Arteriogram, Pipeline Embolization of Aneurysm     Patient location during evaluation: PACU Anesthesia Type: General Level of consciousness: awake and alert Pain management: pain level controlled Vital Signs Assessment: post-procedure vital signs reviewed and stable Respiratory status: spontaneous breathing, nonlabored ventilation and respiratory function stable Cardiovascular status: blood pressure returned to baseline and stable Postop Assessment: no apparent nausea or vomiting Anesthetic complications: no  No notable events documented.  Last Vitals:  Vitals:   01/13/22 1615 01/13/22 1630  BP: 115/81 117/81  Pulse: 71 74  Resp: 15 12  Temp:  (!) 36.1 C  SpO2: 100% 100%    Last Pain:  Vitals:   01/13/22 1500  PainSc: 0-No pain    LLE Motor Response: Purposeful movement (01/13/22 1630) LLE Sensation: Full sensation (01/13/22 1630) RLE Motor Response: Purposeful movement (01/13/22 1630) RLE Sensation: Full sensation (01/13/22 1630)      Diesel Lina,W. EDMOND

## 2022-01-13 NOTE — Progress Notes (Signed)
Patient presented from PACU with cleviprex infusing notified Dr Conchita Paris for order he did want to continue the medication and order for systolic b/p to be less than 160 nursing order written

## 2022-01-13 NOTE — Sedation Documentation (Signed)
Right femoral sheath removed, manual pressure being held at right femoral artery.

## 2022-01-13 NOTE — H&P (Signed)
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  She had presented with aphasia and right-sided weakness.  After a rehab stay, she was discharged to a skilled nursing facility in [place], and has since been home for a few months.  She has actually made drastic improvement in her speech and right-sided weakness.  She is now ambulatory with the assistance of a rolling walker, she does use a wheelchair for longer distances.  She did have a DVT but is now off anticoagulation.  Patient does also have a history of type B aortic dissection with her vascular/CT surgeon at [facility].  She also does have a large right carotid aneurysm which, given her history of SAH, I did recommend treating. She has been on DAPT for the last week including dose this am. I have also spoken with her vascular surgeon regarding the procedure today in regards to her possible aortic dissection treatment in the future and her other smaller brain aneurysms.  Past Medical History   Past Medical History:  Diagnosis Date   Arthritis    generalized   Asthma    Hx   DVT (deep venous thrombosis) (HCC) 07/03/2021   BLE   Graves disease    Hypertension    Stroke (HCC) 06/20/2021   large SAH for left MCA aneursym, s/p coil embolization    Past Surgical History   Past Surgical History:  Procedure Laterality Date   ABDOMINAL HYSTERECTOMY  1991   fibroids removed   CARDIAC CATHETERIZATION     May 2023   CRANIOTOMY N/A 06/21/2021   Procedure: Rosalin Hawking WITH ANEURYSM COILING;  Surgeon: Lisbeth Renshaw, MD;  Location: [facility];  Service: Neurosurgery;  Laterality: N/A;   GASTRIC BYPASS  2012   IR 3D INDEPENDENT WKST  06/21/2021   IR ANGIO INTRA EXTRACRAN SEL INTERNAL CAROTID BILAT MOD SED  06/21/2021   IR ANGIO VERTEBRAL SEL  VERTEBRAL UNI L MOD SED  06/21/2021   IR ANGIOGRAM FOLLOW UP STUDY  06/21/2021   IR ANGIOGRAM FOLLOW UP STUDY  06/21/2021   IR NEURO EACH ADD'L AFTER BASIC UNI LEFT (MS)  06/21/2021   IR TRANSCATH/EMBOLIZ  06/21/2021   KNEE ARTHROPLASTY Right 2022    Social History   Social History   Tobacco Use   Smoking status: Never   Smokeless tobacco: Never  Substance Use Topics   Alcohol use: Not Currently   Drug use: Never    Medications   Prior to Admission medications   Medication Sig Start Date End Date Taking? Authorizing Provider  acetaminophen (TYLENOL) 650 MG CR tablet Take 650 mg by mouth every 8 (eight) hours as needed for pain.   Yes [provider]  amLODipine (NORVASC) 5 MG tablet Take 5 mg by mouth daily.   Yes [provider]  carvedilol (COREG) 6.25 MG tablet Take 6.25 mg by mouth 2 (two) times daily with a meal.   Yes [provider]  fluticasone (FLONASE) 50 MCG/ACT nasal spray Place 2 sprays into both nostrils daily.   Yes [provider]  methocarbamol (ROBAXIN) 500 MG tablet Take 500 mg by mouth every 6 (six) hours as needed for muscle spasms. 11/19/21  Yes [provider]  aspirin 81 MG chewable tablet Chew 81 mg by mouth daily. 12/30/21   [provider]  BRILINTA 90 MG TABS tablet Take 90 mg by mouth 2 (two) times daily. 12/30/21   [provider]  enoxaparin (LOVENOX) 100 MG/ML injection Inject 1 mL (100 mg total) into the skin every 12 (twelve) hours. Patient not taking: Reported on 01/05/2022 07/16/21   Lisbeth Renshaw, MD    Allergies  No Known Allergies  Review of Systems  ROS  Neurologic Exam  Awake, alert, oriented Memory and concentration grossly intact Speech fluent, appropriate CN grossly intact Motor exam: Upper Extremities Deltoid Bicep Tricep Grip  Right 5/5 5/5 5/5 5/5  Left 5/5 5/5 5/5 5/5   Lower Extremities IP Quad PF DF EHL  Right 5/5 5/5 5/5 5/5 5/5  Left 5/5 5/5 5/5 5/5  5/5   Sensation grossly intact to LT  Imaging  Previous diagnostic cerebral angiogram was again also reviewed.  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   Impression  61 year old woman with previous subarachnoid hemorrhage who has made an excellent neurologic recovery in terms of her mixed aphasia and right hemiparesis.  She does have a large right-sided carotid aneurysm which will also require treatment as well as a type B dissection.  I think the small posterior communicating artery aneurysms can be monitored radiographically for now.  Plan  - Will proceed with Pipeline embolization of the right carotid aneurysm  I have reviewed the indications for the procedure as well as the details of the procedure and the expected postoperative course and recovery at length with the patient and her daughter in the office. We have also reviewed in detail the risks, benefits, and alternatives to the procedure. All questions were answered and Yolanda Boone provided informed consent to proceed.  Lisbeth Renshaw, MD [facility]

## 2022-01-13 NOTE — Anesthesia Procedure Notes (Signed)
Arterial Line Insertion Start/End12/21/2023 12:40 PM, 01/13/2022 12:45 PM Performed by: Lelon Perla, CRNA, CRNA  Patient location: Pre-op. Preanesthetic checklist: patient identified, IV checked, site marked, risks and benefits discussed, surgical consent, monitors and equipment checked, pre-op evaluation, timeout performed and anesthesia consent Lidocaine 1% used for infiltration Left, radial was placed Catheter size: 20 G Hand hygiene performed  and maximum sterile barriers used   Attempts: 1 Procedure performed without using ultrasound guided technique. Following insertion, dressing applied and Biopatch. Post procedure assessment: normal and unchanged  Patient tolerated the procedure well with no immediate complications.

## 2022-01-13 NOTE — Anesthesia Procedure Notes (Signed)
Procedure Name: Intubation Date/Time: 01/13/2022 1:10 PM  Performed by: Dorthea Cove, CRNAPre-anesthesia Checklist: Patient identified, Emergency Drugs available, Suction available and Patient being monitored Patient Re-evaluated:Patient Re-evaluated prior to induction Oxygen Delivery Method: Circle system utilized Preoxygenation: Pre-oxygenation with 100% oxygen Induction Type: IV induction Ventilation: Mask ventilation without difficulty Laryngoscope Size: Mac and 3 Grade View: Grade I Tube type: Oral Tube size: 7.0 mm Number of attempts: 1 Airway Equipment and Method: Stylet and Oral airway Placement Confirmation: ETT inserted through vocal cords under direct vision, positive ETCO2 and breath sounds checked- equal and bilateral Secured at: 21 cm Tube secured with: Tape Dental Injury: Teeth and Oropharynx as per pre-operative assessment

## 2022-01-13 NOTE — Brief Op Note (Signed)
  NEUROSURGERY BRIEF OPERATIVE  NOTE   PREOP DX: RICA aneurysm  POSTOP DX: Same  PROCEDURE: Diagnostic angiogram, Pipeline embolization RICA aneurysm  SURGEON: Dr. Lisbeth Renshaw, MD  ANESTHESIA: GETA  APPROACH: Right trans-femoral  EBL: Minimal  SPECIMENS: None  COMPLICATIONS: None  CONDITION: Stable to recovery  FINDINGS (Full report in [facility]): 1. Successful Pipeline embolization of RICA aneurysm 2. Continued occlusion of previously coiled LMCA aneurysm   Lisbeth Renshaw, MD [facility]

## 2022-01-14 ENCOUNTER — Encounter (HOSPITAL_COMMUNITY): Payer: Self-pay | Admitting: Neurosurgery

## 2022-01-14 MED ORDER — PANTOPRAZOLE SODIUM 40 MG PO TBEC: 40.0000 mg | DELAYED_RELEASE_TABLET | Freq: Every day | ORAL | Status: DC

## 2022-01-14 NOTE — Progress Notes (Signed)
  Transition of Care [facility]) Screening Note   Patient Details  Name: Yolanda Boone Date of Birth: 06-23-1960   Transition of Care [facility]) CM/SW Contact:    Mearl Latin, LCSW Phone Number: 01/14/2022, 9:51 AM    Transition of Care Department [facility]) has reviewed patient. We will continue to monitor patient advancement through interdisciplinary progression rounds. If new patient transition needs arise, please place a TOC consult.

## 2022-01-14 NOTE — Discharge Summary (Addendum)
Physician Discharge Summary  Patient ID: Yolanda Boone MRN: [id number] DOB/AGE: 1961/01/16 61 y.o.  Admit date: 01/13/2022 Discharge date: 01/14/2022  Admission Diagnoses:  Cerebral aneurysm  Discharge Diagnoses:  Same Principal Problem:   Brain aneurysm Active Problems:   Cerebral aneurysm   Discharged Condition: Stable  Hospital Course:  Yolanda Boone is a 61 y.o. female admitted after uncomplicated Pipeline embolization of RICA aneurysm. She was at baseline postop and monitored in the ICU without change in condition. She was discharged on POD#1 in stable condition.  Treatments: Surgery - Pipeline embolization RICA aneurysm  Discharge Exam: Blood pressure 115/80, pulse 60, temperature 98.3 F (36.8 C), temperature source Axillary, resp. rate 19, height 5\' 7"  (1.702 m), weight 96.6 kg, SpO2 100 %. Awake, alert, oriented Speech fluent, appropriate CN grossly intact MAE well Wound c/d/i  Disposition: Discharge disposition: 01-Home or Self Care       Discharge Instructions     Call MD for:  redness, tenderness, or signs of infection (pain, swelling, redness, odor or green/yellow discharge around incision site)   Complete by: As directed    Call MD for:  redness, tenderness, or signs of infection (pain, swelling, redness, odor or green/yellow discharge around incision site)   Complete by: As directed    Call MD for:  temperature >100.4   Complete by: As directed    Call MD for:  temperature >100.4   Complete by: As directed    Diet - low sodium heart healthy   Complete by: As directed    Discharge instructions   Complete by: As directed    Walk at home as much as possible, at least 4 times / day   Discharge instructions   Complete by: As directed    Walk at home as much as possible, at least 4 times / day   Increase activity slowly   Complete by: As directed    Increase activity slowly   Complete by: As directed    Lifting restrictions   Complete by: As  directed    No lifting > 10 lbs   Lifting restrictions   Complete by: As directed    No lifting > 10 lbs   May shower / Bathe   Complete by: As directed    48 hours after surgery   May shower / Bathe   Complete by: As directed    48 hours after surgery   May walk up steps   Complete by: As directed    May walk up steps   Complete by: As directed    No wound care   Complete by: As directed    Other Restrictions   Complete by: As directed    No bending/twisting at waist   Other Restrictions   Complete by: As directed    No bending/twisting at waist      Allergies as of 01/14/2022   No Known Allergies      Medication List     TAKE these medications    acetaminophen 650 MG CR tablet Commonly known as: TYLENOL Take 650 mg by mouth every 8 (eight) hours as needed for pain.   amLODipine 5 MG tablet Commonly known as: NORVASC Take 5 mg by mouth daily.   aspirin 81 MG chewable tablet Chew 81 mg by mouth daily.   Brilinta 90 MG Tabs tablet Generic drug: ticagrelor Take 90 mg by mouth 2 (two) times daily.   carvedilol 6.25 MG tablet Commonly known as: COREG Take 6.25 mg by  mouth 2 (two) times daily with a meal.   fluticasone 50 MCG/ACT nasal spray Commonly known as: FLONASE Place 2 sprays into both nostrils daily.   methocarbamol 500 MG tablet Commonly known as: ROBAXIN Take 500 mg by mouth every 6 (six) hours as needed for muscle spasms.        Follow-up Information     Consuella Lose, MD Follow up.   Specialty: Neurosurgery Contact information: [address] [place] [address] [phone]                 Signed: Jairo Ben 01/14/2022, 11:05 AM

## 2022-01-14 NOTE — Plan of Care (Signed)
Care plan reviewed with patient and daughter.

## 2022-01-18 ENCOUNTER — Encounter (HOSPITAL_COMMUNITY): Payer: Self-pay

## 2022-01-18 ENCOUNTER — Other Ambulatory Visit (HOSPITAL_COMMUNITY): Payer: Self-pay | Admitting: Neurosurgery

## 2022-01-18 DIAGNOSIS — I671 Cerebral aneurysm, nonruptured: Secondary | ICD-10-CM

## 2022-01-18 HISTORY — PX: IR ANGIOGRAM FOLLOW UP STUDY: IMG697

## 2022-03-08 ENCOUNTER — Ambulatory Visit (INDEPENDENT_AMBULATORY_CARE_PROVIDER_SITE_OTHER): Payer: BC Managed Care – PPO | Admitting: Vascular Surgery

## 2022-03-08 ENCOUNTER — Ambulatory Visit (INDEPENDENT_AMBULATORY_CARE_PROVIDER_SITE_OTHER): Payer: BC Managed Care – PPO

## 2022-03-08 ENCOUNTER — Encounter (INDEPENDENT_AMBULATORY_CARE_PROVIDER_SITE_OTHER): Payer: Self-pay | Admitting: Vascular Surgery

## 2022-03-08 VITALS — BP 150/106 | Ht 67.0 in | Wt 204.0 lb

## 2022-03-08 DIAGNOSIS — M79609 Pain in unspecified limb: Secondary | ICD-10-CM | POA: Insufficient documentation

## 2022-03-08 DIAGNOSIS — I739 Peripheral vascular disease, unspecified: Secondary | ICD-10-CM

## 2022-03-08 DIAGNOSIS — I10 Essential (primary) hypertension: Secondary | ICD-10-CM

## 2022-03-08 DIAGNOSIS — Z9889 Other specified postprocedural states: Secondary | ICD-10-CM

## 2022-03-08 DIAGNOSIS — I6012 Nontraumatic subarachnoid hemorrhage from left middle cerebral artery: Secondary | ICD-10-CM | POA: Diagnosis not present

## 2022-03-08 DIAGNOSIS — M79604 Pain in right leg: Secondary | ICD-10-CM

## 2022-03-08 DIAGNOSIS — Z8679 Personal history of other diseases of the circulatory system: Secondary | ICD-10-CM

## 2022-03-08 NOTE — Assessment & Plan Note (Signed)
She had normal triphasic waveforms bilaterally with normal digital pressures bilaterally and ABIs of 1.05 and 1.2 consistent with no arterial insufficiency.  It does not appear as if arterial insufficiency is the cause of his lower extremity symptoms.  I would suspect neuropathic changes after her stroke.  No further vascular workup for her lower extremities is planned at this time.

## 2022-03-08 NOTE — Progress Notes (Signed)
Patient ID: Yolanda Boone, female   DOB: December 11, 1960, 62 y.o.   MRN: [id number]  Chief Complaint  Patient presents with   New Patient (Initial Visit)    Burnard Bunting + consult PAD    HPI Yolanda Boone is a 62 y.o. female.  I am asked to see the patient by Dr. Jettie Booze for evaluation of possible PAD DVT.  Patient has a right knee surgery and this does seem to be healing reasonably well.  Since her surgery, she had a stroke from a subarachnoid hemorrhage from a left MCA aneurysm.  She underwent neurointerventional treatment of this about 9 months ago.  She still has some speech issues and some mobility issues.  Her right leg has been intermittently cold since that time.  Her orthopedic surgeon Boone very concerned about her circulation and potential for healing as well as the temperature and color.  This prompted her referral.  She does also have what sounds like a thoracic aortic aneurysm and is scheduled to see a vascular surgeon at [facility] for further evaluation of this.  To evaluate her lower extremity arterial perfusion, noninvasive studies were performed today.  She had normal triphasic waveforms bilaterally with normal digital pressures bilaterally and ABIs of 1.05 and 1.2 consistent with no arterial insufficiency.     Past Medical History:  Diagnosis Date   Arthritis    generalized   Asthma    Hx   DVT (deep venous thrombosis) ([place]) 07/03/2021   BLE   Graves disease    Hypertension    Stroke ([place]) 06/20/2021   large SAH for left MCA aneursym, s/p coil embolization    Past Surgical History:  Procedure Laterality Date   ABDOMINAL HYSTERECTOMY  1991   fibroids removed   CARDIAC CATHETERIZATION     May 2023   CRANIOTOMY N/A 06/21/2021   Procedure: Cyril Loosen WITH ANEURYSM COILING;  Surgeon: Consuella Lose, MD;  Location: [place];  Service: Neurosurgery;  Laterality: N/A;   GASTRIC BYPASS  2012   IR 3D INDEPENDENT WKST  06/21/2021   IR ANGIO INTRA EXTRACRAN SEL COM CAROTID INNOMINATE UNI  L MOD SED  01/13/2022   IR ANGIO INTRA EXTRACRAN SEL INTERNAL CAROTID BILAT MOD SED  06/21/2021   IR ANGIO INTRA EXTRACRAN SEL INTERNAL CAROTID UNI R MOD SED  01/13/2022   IR ANGIO VERTEBRAL SEL VERTEBRAL UNI L MOD SED  06/21/2021   IR ANGIOGRAM FOLLOW UP STUDY  06/21/2021   IR ANGIOGRAM FOLLOW UP STUDY  06/21/2021   IR ANGIOGRAM FOLLOW UP STUDY  01/18/2022   IR NEURO EACH ADD'L AFTER BASIC UNI LEFT (MS)  06/21/2021   IR NEURO EACH ADD'L AFTER BASIC UNI RIGHT (MS)  01/13/2022   IR TRANSCATH/EMBOLIZ  06/21/2021   IR TRANSCATH/EMBOLIZ  01/13/2022   KNEE ARTHROPLASTY Right 2022   RADIOLOGY WITH ANESTHESIA N/A 01/13/2022   Procedure: Arteriogram, Pipeline Embolization of Aneurysm;  Surgeon: Consuella Lose, MD;  Location: [place];  Service: Radiology;  Laterality: N/A;     Family History No bleeding disorders, clotting disorders, autoimmune diseases or porphyrias.     Social History   Tobacco Use   Smoking status: Never   Smokeless tobacco: Never  Substance Use Topics   Alcohol use: Not Currently   Drug use: Never     No Known Allergies  Current Outpatient Medications  Medication Sig Dispense Refill   acetaminophen (TYLENOL) 650 MG CR tablet Take 650 mg by mouth every 8 (eight) hours as needed for pain.  amLODipine (NORVASC) 5 MG tablet Take 5 mg by mouth daily.     aspirin 81 MG chewable tablet Chew 81 mg by mouth daily.     BRILINTA 90 MG TABS tablet Take 90 mg by mouth 2 (two) times daily.     carvedilol (COREG) 6.25 MG tablet Take 6.25 mg by mouth 2 (two) times daily with a meal.     fluticasone (FLONASE) 50 MCG/ACT nasal spray Place 2 sprays into both nostrils daily.     methocarbamol (ROBAXIN) 500 MG tablet Take 500 mg by mouth every 6 (six) hours as needed for muscle spasms.     No current facility-administered medications for this visit.      REVIEW OF SYSTEMS (Negative unless checked)  Constitutional: []$ Weight loss  []$ Fever  []$ Chills Cardiac: []$ Chest pain    []$ Chest pressure   []$ Palpitations   []$ Shortness of breath when laying flat   []$ Shortness of breath at rest   []$ Shortness of breath with exertion. Vascular:  [x]$ Pain in legs with walking   [x]$ Pain in legs at rest   []$ Pain in legs when laying flat   []$ Claudication   []$ Pain in feet when walking  []$ Pain in feet at rest  []$ Pain in feet when laying flat   []$ History of DVT   []$ Phlebitis   []$ Swelling in legs   []$ Varicose veins   []$ Non-healing ulcers Pulmonary:   []$ Uses home oxygen   []$ Productive cough   []$ Hemoptysis   []$ Wheeze  []$ COPD   []$ Asthma Neurologic:  []$ Dizziness  []$ Blackouts   []$ Seizures   [x]$ History of stroke   []$ History of TIA  []$ Aphasia   []$ Temporary blindness   []$ Dysphagia   []$ Weakness or numbness in arms   []$ Weakness or numbness in legs Musculoskeletal:  [x]$ Arthritis   []$ Joint swelling   [x]$ Joint pain   []$ Low back pain Hematologic:  []$ Easy bruising  []$ Easy bleeding   []$ Hypercoagulable state   []$ Anemic  []$ Hepatitis Gastrointestinal:  []$ Blood in stool   []$ Vomiting blood  []$ Gastroesophageal reflux/heartburn   []$ Abdominal pain Genitourinary:  []$ Chronic kidney disease   []$ Difficult urination  []$ Frequent urination  []$ Burning with urination   []$ Hematuria Skin:  []$ Rashes   []$ Ulcers   []$ Wounds Psychological:  []$ History of anxiety   []$  History of major depression.    Physical Exam BP (!) 150/106 (BP Location: Right Arm)   Ht 5' 7"$  (1.702 m)   Wt 204 lb (92.5 kg)   BMI 31.95 kg/m  Gen:  WD/WN, NAD Head: [place]/AT, No temporalis wasting.  Ear/Nose/Throat: Hearing grossly intact, nares w/o erythema or drainage, oropharynx w/o Erythema/Exudate Eyes: Conjunctiva clear, sclera non-icteric  Neck: trachea midline.  No JVD.  Pulmonary:  Good air movement, respirations not labored, no use of accessory muscles  Cardiac: RRR, no JVD Vascular:  Vessel Right Left  Radial Palpable Palpable                          DP Palpable  Palpable  PT Palpable Palpable   Gastrointestinal:. No masses, surgical  incisions, or scars. Musculoskeletal: Mild right-sided weakness extremities without ischemic changes.  No deformity or atrophy.  No significant lower extremity edema. Neurologic: Sensation grossly intact in extremities. Speech is limited Psychiatric: Judgment intact, Mood & affect appropriate for pt's clinical situation. Dermatologic: No rashes or ulcers noted.  No cellulitis or open wounds.    Radiology No results found.  Labs Recent Results (from the past 2160 hour(s))  CBC with Differential/Platelet     Status: None  Collection Time: 01/07/22  3:40 PM  Result Value Ref Range   WBC 6.7 4.0 - 10.5 K/uL   RBC 4.33 3.87 - 5.11 MIL/uL   Hemoglobin 12.3 12.0 - 15.0 g/dL   HCT 38.0 36.0 - 46.0 %   MCV 87.8 80.0 - 100.0 fL   MCH 28.4 26.0 - 34.0 pg   MCHC 32.4 30.0 - 36.0 g/dL   RDW 15.0 11.5 - 15.5 %   Platelets 286 150 - 400 K/uL   nRBC 0.0 0.0 - 0.2 %   Neutrophils Relative % 57 %   Neutro Abs 3.7 1.7 - 7.7 K/uL   Lymphocytes Relative 33 %   Lymphs Abs 2.2 0.7 - 4.0 K/uL   Monocytes Relative 9 %   Monocytes Absolute 0.6 0.1 - 1.0 K/uL   Eosinophils Relative 1 %   Eosinophils Absolute 0.1 0.0 - 0.5 K/uL   Basophils Relative 0 %   Basophils Absolute 0.0 0.0 - 0.1 K/uL   Immature Granulocytes 0 %   Abs Immature Granulocytes 0.02 0.00 - 0.07 K/uL    Comment: Performed at [facility], [address] [place], [place] [id number]  Basic metabolic panel     Status: Abnormal   Collection Time: 01/07/22  3:40 PM  Result Value Ref Range   Sodium 139 135 - 145 mmol/L   Potassium 3.4 (L) 3.5 - 5.1 mmol/L   Chloride 105 98 - 111 mmol/L   CO2 26 22 - 32 mmol/L   Glucose, Bld 99 70 - 99 mg/dL    Comment: Glucose reference range applies only to samples taken after fasting for at least 8 hours.   BUN 10 6 - 20 mg/dL   Creatinine, Ser 0.60 0.44 - 1.00 mg/dL   Calcium 8.9 8.9 - 10.3 mg/dL   GFR, Estimated >60 >60 mL/min    Comment: (NOTE) Calculated using the CKD-EPI Creatinine  Equation (2021)    Anion gap 8 5 - 15    Comment: Performed at [facility] [address] [place], [place] [address]  Protime-INR     Status: None   Collection Time: 01/07/22  3:40 PM  Result Value Ref Range   Prothrombin Time 14.6 11.4 - 15.2 seconds   INR 1.2 0.8 - 1.2    Comment: (NOTE) INR goal varies based on device and disease states. Performed at [facility], [place] [address] [place], [place] [address]   APTT     Status: None   Collection Time: 01/07/22  3:40 PM  Result Value Ref Range   aPTT 31 24 - 36 seconds    Comment: Performed at [facility], [place] [address], [place], [place] [address]  CBC     Status: Abnormal   Collection Time: 01/13/22  5:56 PM  Result Value Ref Range   WBC 20.8 (H) 4.0 - 10.5 K/uL   RBC 4.38 3.87 - 5.11 MIL/uL   Hemoglobin 12.4 12.0 - 15.0 g/dL   HCT 37.1 36.0 - 46.0 %   MCV 84.7 80.0 - 100.0 fL   MCH 28.3 26.0 - 34.0 pg   MCHC 33.4 30.0 - 36.0 g/dL   RDW 14.4 11.5 - 15.5 %   Platelets 303 150 - 400 K/uL   nRBC 0.0 0.0 - 0.2 %    Comment: Performed at [facility], [place] [address] [place], [place] [address]  Creatinine, serum     Status: None   Collection Time: 01/13/22  5:56 PM  Result Value Ref Range   Creatinine, Ser 0.61 0.44 - 1.00 mg/dL   GFR, Estimated >60 >60 mL/min    Comment: (NOTE) Calculated using the CKD-EPI Creatinine Equation (2021) Performed at [facility], [place] [address], [place], [place] [address]     Assessment/Plan:  Pain in limb She had normal triphasic waveforms bilaterally with normal digital pressures bilaterally and ABIs of 1.05 and 1.2 consistent with no arterial insufficiency.  It does not appear as if arterial insufficiency is the cause of his lower extremity symptoms.  I would suspect neuropathic changes after her stroke.  No further vascular workup for her lower extremities is planned at this time.  Hx of repair of dissecting thoracic aortic aneurysm, Stanford type  B Scheduled to see Dr. Sammuel Hines for this in the near future.  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, left (HCC) Last year with coil embolization performed at that time.  Essential hypertension blood pressure control important in reducing the progression of atherosclerotic disease. On appropriate oral medications.      Leotis Pain 03/08/2022, 12:09 PM   This note Boone created with Dragon medical transcription system.  Any errors from dictation are unintentional.

## 2022-03-08 NOTE — Assessment & Plan Note (Signed)
Scheduled to see Dr. Sammuel Hines for this in the near future.

## 2022-03-08 NOTE — Assessment & Plan Note (Signed)
blood pressure control important in reducing the progression of atherosclerotic disease. On appropriate oral medications.  

## 2022-03-08 NOTE — Assessment & Plan Note (Signed)
Last year with coil embolization performed at that time.

## 2022-03-16 LAB — VAS US ABI WITH/WO TBI
Left ABI: 1.2
Right ABI: 1.05

## 2022-06-15 ENCOUNTER — Other Ambulatory Visit: Payer: Self-pay | Admitting: Neurosurgery

## 2022-06-15 DIAGNOSIS — I671 Cerebral aneurysm, nonruptured: Secondary | ICD-10-CM

## 2022-06-16 ENCOUNTER — Other Ambulatory Visit: Payer: Self-pay | Admitting: Neurosurgery

## 2022-07-05 ENCOUNTER — Inpatient Hospital Stay (HOSPITAL_COMMUNITY)
Admission: RE | Admit: 2022-07-05 | Discharge: 2022-07-05 | Disposition: A | Discharge status: Home / Self Care | Payer: BC Managed Care – PPO | Source: Ambulatory Visit | Attending: Neurosurgery | Admitting: Neurosurgery

## 2022-07-19 ENCOUNTER — Ambulatory Visit (HOSPITAL_COMMUNITY): Admission: RE | Admit: 2022-07-19 | Payer: BC Managed Care – PPO | Source: Ambulatory Visit

## 2022-09-02 ENCOUNTER — Other Ambulatory Visit (HOSPITAL_COMMUNITY): Payer: Self-pay | Admitting: Neurosurgery

## 2022-09-02 DIAGNOSIS — I671 Cerebral aneurysm, nonruptured: Secondary | ICD-10-CM

## 2022-09-15 ENCOUNTER — Ambulatory Visit (HOSPITAL_COMMUNITY)
Admission: RE | Admit: 2022-09-15 | Discharge: 2022-09-15 | Disposition: A | Discharge status: Home / Self Care | Payer: BC Managed Care – PPO | Source: Ambulatory Visit | Attending: Neurosurgery | Admitting: Neurosurgery

## 2022-09-15 DIAGNOSIS — I671 Cerebral aneurysm, nonruptured: Secondary | ICD-10-CM | POA: Diagnosis present

## 2022-09-15 MED ORDER — IOHEXOL 350 MG/ML SOLN
75.0000 mL | Freq: Once | INTRAVENOUS | Status: AC | PRN
  Administered 2022-09-15: 75 mL via INTRAVENOUS

## 2023-02-01 ENCOUNTER — Ambulatory Visit: Payer: Self-pay | Admitting: Physical Therapy

## 2023-02-01 ENCOUNTER — Ambulatory Visit: Payer: Medicare (Managed Care) | Attending: Family Medicine

## 2023-02-01 ENCOUNTER — Ambulatory Visit: Payer: Medicare (Managed Care) | Admitting: Occupational Therapy

## 2023-02-01 DIAGNOSIS — R41841 Cognitive communication deficit: Secondary | ICD-10-CM | POA: Diagnosis present

## 2023-02-01 DIAGNOSIS — R4701 Aphasia: Secondary | ICD-10-CM | POA: Insufficient documentation

## 2023-02-01 DIAGNOSIS — M6281 Muscle weakness (generalized): Secondary | ICD-10-CM

## 2023-02-01 DIAGNOSIS — R269 Unspecified abnormalities of gait and mobility: Secondary | ICD-10-CM | POA: Insufficient documentation

## 2023-02-01 DIAGNOSIS — I6930 Unspecified sequelae of cerebral infarction: Secondary | ICD-10-CM | POA: Diagnosis present

## 2023-02-01 DIAGNOSIS — R278 Other lack of coordination: Secondary | ICD-10-CM

## 2023-02-01 NOTE — Therapy (Signed)
 OUTPATIENT SPEECH LANGUAGE PATHOLOGY APHASIA EVALUATION   Patient Name: Yolanda Boone MRN: [id number] DOB:1960/10/05, 63 y.o., female Today's Date: 02/01/2023  PCP: none listed  REFERRING PROVIDER: Wanda Clause, MD   End of Session - 02/01/23 0959     Visit Number 1    Number of Visits 24    Date for SLP Re-Evaluation 04/26/23    SLP Start Time 0845    SLP Stop Time  0930    SLP Time Calculation (min) 45 min             Past Medical History:  Diagnosis Date   Arthritis    generalized   Asthma    Hx   DVT (deep venous thrombosis) (HCC) 07/03/2021   BLE   Graves disease    Hypertension    Stroke (HCC) 06/20/2021   large SAH for left MCA aneursym, s/p coil embolization   Past Surgical History:  Procedure Laterality Date   ABDOMINAL HYSTERECTOMY  1991   fibroids removed   CARDIAC CATHETERIZATION     May 2023   CRANIOTOMY N/A 06/21/2021   Procedure: GERALYN WITH ANEURYSM COILING;  Surgeon: Lanis Pupa, MD;  Location: [facility];  Service: Neurosurgery;  Laterality: N/A;   GASTRIC BYPASS  2012   IR 3D INDEPENDENT WKST  06/21/2021   IR ANGIO INTRA EXTRACRAN SEL COM CAROTID INNOMINATE UNI L MOD SED  01/13/2022   IR ANGIO INTRA EXTRACRAN SEL INTERNAL CAROTID BILAT MOD SED  06/21/2021   IR ANGIO INTRA EXTRACRAN SEL INTERNAL CAROTID UNI R MOD SED  01/13/2022   IR ANGIO VERTEBRAL SEL VERTEBRAL UNI L MOD SED  06/21/2021   IR ANGIOGRAM FOLLOW UP STUDY  06/21/2021   IR ANGIOGRAM FOLLOW UP STUDY  06/21/2021   IR ANGIOGRAM FOLLOW UP STUDY  01/18/2022   IR NEURO EACH ADD'L AFTER BASIC UNI LEFT (MS)  06/21/2021   IR NEURO EACH ADD'L AFTER BASIC UNI RIGHT (MS)  01/13/2022   IR TRANSCATH/EMBOLIZ  06/21/2021   IR TRANSCATH/EMBOLIZ  01/13/2022   KNEE ARTHROPLASTY Right 2022   RADIOLOGY WITH ANESTHESIA N/A 01/13/2022   Procedure: Arteriogram, Pipeline Embolization of Aneurysm;  Surgeon: Lanis Pupa, MD;  Location: [facility];  Service: Radiology;  Laterality: N/A;    Patient Active Problem List   Diagnosis Date Noted   Pain in limb 03/08/2022   Brain aneurysm 01/13/2022   Cerebral aneurysm 01/13/2022   DVT of lower extremity, bilateral (HCC) 07/04/2021   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, left (HCC) 06/22/2021   Acute respiratory failure (HCC) 06/22/2021   Compression of brain due to nontraumatic subarachnoid hemorrhage (HCC) 06/22/2021   Hx of aortic aneurysm 06/22/2021   Acute respiratory failure with hypoxia (HCC)    Hx of repair of dissecting thoracic aortic aneurysm, Stanford type B 06/02/2021   Closed fracture of fifth metatarsal bone 08/06/2020   Graves disease 10/08/2019   Essential hypertension 10/03/2012   Allergic rhinitis 12/19/2011    ONSET DATE: 12/16/22 (referral date); stroke 06/21/22  REFERRING DIAG: history of stroke with residual deficits  THERAPY DIAG:  History of stroke with residual deficit  Aphasia  Cognitive communication deficit  Rationale for Evaluation and Treatment Rehabilitation  SUBJECTIVE:   SUBJECTIVE STATEMENT: Pt alert, pleasant, and cooperative. Eager to participate in ST. Pt accompanied by: family member - daughter  PERTINENT HISTORY: 63 y.o. female with hx of large L SAH s/p craniotomy (May 2023) and embolization of R ICA aneurysm and coil embolization of L MCA aneursym (December 2023).  DIAGNOSTIC FINDINGS: CT angio 09/15/22 1. Satisfactory post endovascular treated appearance of the Right ICA siphon and Left MCA bifurcation aneurysms. No residual aneurysmal enhancement.   2. Fetal Left PCA origin with suggestion of a tiny anterior saccular vascular outpouching or vestigial vessel from the same Left Pcomm infundibulum, see series 13, image 90. This is not definitely identified on the previous conventional angiograms. Recommend attention on follow-up cerebral angiogram.   3. No other intracranial aneurysm. Mild intracranial atherosclerosis. No intracranial large  vessel occlusion.   4. Resolved intracranial hemorrhage. Post hemorrhage mild ventricular enlargement and encephalomalacia of the left insula/operculum. No new intracranial abnormality by CT.  PAIN:  Are you having pain? No  FALLS: Has patient fallen in last 6 months?  No  LIVING ENVIRONMENT: Lives with: lives with their daughter Lives in: House/apartment  PLOF:  Level of assistance: Needed assistance with ADLs, Needed assistance with IADLS Employment: On disability   PATIENT GOALS    to improve communication   OBJECTIVE:  COGNITION: Overall cognitive status: Difficulty to assess due to: Communication impairment Areas of impairment:  Attention: Impaired: Comment: auditory Functional deficits: extra time for processing, benefits from repetition of stimuli  AUDITORY COMPREHENSION: Overall auditory comprehension: Impaired: complex YES/NO questions: Impaired: complex Following directions: Impaired: moderately complex and complex Conversation: Complex Interfering components: attention    READING COMPREHENSION: TBA  EXPRESSION: verbal  VERBAL EXPRESSION: Overall verbal expression: Impaired: moderately complex and complex Level of generative/spontaneous verbalization: sentence and conversation Automatic speech: name: intact and social response: intact  Repetition: Impaired: phrase and sentence Naming: Responsive: 76-100%, Confrontation: 76-100%, and Divergent: 10 animals in 60s Pragmatics: Impaired: topic maintenance Comments: due to aphasia  WRITTEN EXPRESSION: Dominant hand: right  Written expression:  TBA  MOTOR SPEECH: Overall motor speech: impaired Level of impairment: Phrase and Sentence Respiration:  WFL Phonation: normal Resonance: WFL Articulation: Appears intact Intelligibility: Intelligible Motor planning: Impaired: aware, groping for words, and inconsistent    ORAL MOTOR EXAMINATION No functional deficits  STANDARDIZED  ASSESSMENTS:  Western Aphasia Battery- Revised  Spontaneous Speech                         Information content               7/10                                            Fluency                                 9/10                                          Comprehension     Yes/No questions                 57/60                                           Auditory Word Recognition  58/60  Sequential Commands       44/80                              Repetition                             69/100                                        Naming    Object Naming                     57/60                                           Word Fluency                        10/20                                            Sentence Completion          10/10                                             Responsive Speech              10/10                                         Aphasia Quotient                  79.1/100         Pt's severity rating was mild as indicated by an Aphasia Quotient of 79.1 (0-25=very severe, 26-50=severe, 51-75=moderate, 76 and above is mild). Pt's presentation is most consistent with very mild Broca's/anomic aphasia subtype. Pt with s/sx coexisting apraxia of speech.   PATIENT REPORTED OUTCOME MEASURES (PROM): To be given in upcoming sessions     PATIENT EDUCATION: Education details: role of SLP, results of assessment, SLP POC Person educated: Patient and Child(ren) Education method: Explanation Education comprehension: verbalized understanding and needs further education  HOME EXERCISE PROGRAM:   To be given in upcoming sessoin    GOALS:  Goals reviewed with patient? Yes  SHORT TERM GOALS: Target date: 10 sessions  Pt will complete PROM regarding current communication status. Baseline: Goal status: INITIAL  2.  Pt will participate in further assessment of functional reading and writing with additional goals added as  appropriate. Baseline:  Goal status: INITIAL  3.  Pt will complete moderately complex auditory comprehension tasks with >80% accuracy and cues as needed.  Baseline:  Goal status: INITIAL  4.  Pt will utilize compensatory strategies for anomia with min/mod cueing during structured tasks. Baseline:  Goal status: INITIAL   LONG TERM GOALS: Target date: 12 weeks, 04/26/23  Patient will demonstrate knowledge of  appropriate activities to support functional receptive and expressive language outside of ST with assistance from family.  Baseline:  Goal status: INITIAL  2.  Patient and/or family will report use of strategies outside of ST to improve communication (use of scripts, pre-planning, semantic feature analysis, supported conversation).  Baseline:  Goal status: INITIAL  3.  Patient will demonstrate improved functional communication as measured by Communication Effectiveness Survey.  Baseline:  Goal status: INITIAL    ASSESSMENT:  CLINICAL IMPRESSION: Patient is a 63 y.o. female who was seen today for aphasia evaluation in setting of stroke with residual deficits. Pt had large L SAH and craniotomy in May 2023, pt s/p embolization of R ICA aneurysm and coil embolization of L MCA aneursym in December 2023. Assessment completed via functional/dynamic assessment and Western Aphasia Battery - Revised.  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 Noted impaired auditory comprehension for moderately complex-complex information as well as paucity of speech, anomia, and phonemic and semantic paraphasias. Pt with s/sx concomitant apraxia of speech. Pt reports good reading comprehension, but difficulty writing. Further functional dynamic assessment of reading and writing to be completed in upcoming sessions as well as completion of PROM. Pt is highly motivated to improve her communication and appears to have a very supportive daughter with whom she resides to provide needed  support. Recommend course of ST to address functional communication in home and social settings.  OBJECTIVE IMPAIRMENTS include attention, expressive language, receptive language, aphasia, and apraxia. These impairments are limiting patient from ADLs/IADLs and effectively communicating at home and in community. Factors affecting potential to achieve goals and functional outcome are  time post-onset . Patient will benefit from skilled SLP services to address above impairments and improve overall function.  REHAB POTENTIAL: Good  PLAN: SLP FREQUENCY: 1-2x/week  SLP DURATION: 12 weeks  PLANNED INTERVENTIONS: Language facilitation, Environmental controls, Cueing hierachy, Internal/external aids, Functional tasks, Multimodal communication approach, SLP instruction and feedback, Compensatory strategies, and Patient/family education    Delon Bangs, M.S., CCC-SLP Speech-Language Pathologist [facility] [phone] [place])  [facility] at [facility] [address] [place], [place], [address] Phone: [phone]   Fax:  [phone]

## 2023-02-01 NOTE — Addendum Note (Signed)
 Addended by: Avon Gully on: 02/01/2023 04:12 PM   Modules accepted: Orders

## 2023-02-01 NOTE — Addendum Note (Signed)
 Addended by: Woodroe Chen on: 02/01/2023 02:40 PM   Modules accepted: Orders

## 2023-02-01 NOTE — Therapy (Addendum)
 OUTPATIENT OCCUPATIONAL THERAPY NEURO EVALUATION  Patient Name: Yolanda Boone MRN: [id number] DOB:1960-07-29, 63 y.o., female Today's Date: 02/01/2023   REFERRING PROVIDER: Myra Wanda FELIX MD  END OF SESSION:  OT End of Session - 02/01/23 1110     Visit Number 1    Number of Visits 24    Date for OT Re-Evaluation 04/26/23    OT Start Time 0930    OT Stop Time 1020    OT Time Calculation (min) 50 min    Activity Tolerance Patient tolerated treatment well    Behavior During Therapy [facility] for tasks assessed/performed             Past Medical History:  Diagnosis Date   Arthritis    generalized   Asthma    Hx   DVT (deep venous thrombosis) (HCC) 07/03/2021   BLE   Graves disease    Hypertension    Stroke (HCC) 06/20/2021   large SAH for left MCA aneursym, s/p coil embolization   Past Surgical History:  Procedure Laterality Date   ABDOMINAL HYSTERECTOMY  1991   fibroids removed   CARDIAC CATHETERIZATION     May 2023   CRANIOTOMY N/A 06/21/2021   Procedure: GERALYN WITH ANEURYSM COILING;  Surgeon: Lanis Pupa, MD;  Location: [facility] OR;  Service: Neurosurgery;  Laterality: N/A;   GASTRIC BYPASS  2012   IR 3D INDEPENDENT WKST  06/21/2021   IR ANGIO INTRA EXTRACRAN SEL COM CAROTID INNOMINATE UNI L MOD SED  01/13/2022   IR ANGIO INTRA EXTRACRAN SEL INTERNAL CAROTID BILAT MOD SED  06/21/2021   IR ANGIO INTRA EXTRACRAN SEL INTERNAL CAROTID UNI R MOD SED  01/13/2022   IR ANGIO VERTEBRAL SEL VERTEBRAL UNI L MOD SED  06/21/2021   IR ANGIOGRAM FOLLOW UP STUDY  06/21/2021   IR ANGIOGRAM FOLLOW UP STUDY  06/21/2021   IR ANGIOGRAM FOLLOW UP STUDY  01/18/2022   IR NEURO EACH ADD'L AFTER BASIC UNI LEFT (MS)  06/21/2021   IR NEURO EACH ADD'L AFTER BASIC UNI RIGHT (MS)  01/13/2022   IR TRANSCATH/EMBOLIZ  06/21/2021   IR TRANSCATH/EMBOLIZ  01/13/2022   KNEE ARTHROPLASTY Right 2022   RADIOLOGY WITH ANESTHESIA N/A 01/13/2022   Procedure: Arteriogram, Pipeline Embolization  of Aneurysm;  Surgeon: Lanis Pupa, MD;  Location: [facility] OR;  Service: Radiology;  Laterality: N/A;   Patient Active Problem List   Diagnosis Date Noted   Pain in limb 03/08/2022   Brain aneurysm 01/13/2022   Cerebral aneurysm 01/13/2022   DVT of lower extremity, bilateral (HCC) 07/04/2021   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, left (HCC) 06/22/2021   Acute respiratory failure (HCC) 06/22/2021   Compression of brain due to nontraumatic subarachnoid hemorrhage (HCC) 06/22/2021   Hx of aortic aneurysm 06/22/2021   Acute respiratory failure with hypoxia (HCC)    Hx of repair of dissecting thoracic aortic aneurysm, Stanford type B 06/02/2021   Closed fracture of fifth metatarsal bone 08/06/2020   Graves disease 10/08/2019   Essential hypertension 10/03/2012   Allergic rhinitis 12/19/2011    ONSET DATE: 06/18/21  REFERRING DIAG:  Cerebral hemorrhage  THERAPY DIAG:  Muscle weakness (generalized)  Other lack of coordination  Rationale for Evaluation and Treatment: Rehabilitation  SUBJECTIVE:   SUBJECTIVE STATEMENT: Pt. Was present with her Yolanda Boone for initial evaluation.  Reports having had difficulty getting in, and out of the car this morning due to LLE pain. Pt accompanied by:  Yolanda Boone  PERTINENT HISTORY: Pt. Sustained a cerebral hemorrhage in  05/2021. Pt. Has had 2 surgeries for a cerebral aneurysm with the most recent occurring during hospitalization from 12/21-12/22/23. Pt. Has chronic left knee pain that she consistently receives Cortizone shots for. Pt. has had a right TKR in the past, however is not a  candidate for knee surgery on the left due  to CVA/Aneurysm. Pt./Yolanda Boone report hypersensitivity pain in the left LE, and foot. Per Yolanda Boone -Pt. Has a History of right sided LE weakness from the CVA. PMHx includes: HTN, Graves Disease from the CVA.   PRECAUTIONS: None  WEIGHT BEARING RESTRICTIONS: No  PAIN:  Are you having pain? Yes 2/10 left  knee, and foot at initial eval. Reports increased hypersensitivity pain earlier in the morning. Yolanda Boone reports Pt. had difficulty getting in and out of the car this morning due to pain.  FALLS: Has patient fallen in last 6 months? No  LIVING ENVIRONMENT: Lives with: lives with their Yolanda Boone Lives in: House/apartment Stairs: ramped entrance,  one floor Has following equipment at home: Single point cane, Walker - 2 wheeled, Wheelchair (manual), shower chair, and bed side commode  PLOF: Independent  PATIENT GOALS: To regain muscles.  OBJECTIVE:  Note: Objective measures were completed at Evaluation unless otherwise noted.  HAND DOMINANCE: Right  ADLs:  Transfers/ambulation related to ADLs: Eating: Independent Grooming: Independent with oral care; Duaghter assists with hair care UB Dressing: Independent with donning shirt, jacket. Yolanda Boone assists with bra LB Dressing: Yolanda Boone assists with LE dressing Toileting: Assist with BSC transfers Bathing: Yolanda Boone assists with bathing Tub Shower transfers: Bed baths, unable to get through the bathroom door   IADLs: Shopping: Yolanda Boone assists Light housekeeping: Total A Meal Prep: Total A Community mobility: Relies on Yolanda Boone Medication management: Yolanda Boone provided set-up  Financial management: No change   MOBILITY STATUS: Needs Assist: 2/2 pain   ACTIVITY TOLERANCE: Activity tolerance:  Limited activity tolerance  FUNCTIONAL OUTCOME MEASURES: FOTO: 55, TR score: 57  UPPER EXTREMITY ROM:  History of left shoulder injury  Active ROM Right Eval WFL Left Eval [facility]  Shoulder flexion    Shoulder abduction    Shoulder adduction    Shoulder extension    Shoulder internal rotation    Shoulder external rotation    Elbow flexion    Elbow extension    Wrist flexion    Wrist extension    Wrist ulnar deviation    Wrist radial deviation    Wrist pronation    Wrist supination    (Blank rows = not tested)  UPPER  EXTREMITY MMT:     MMT Right eval Left eval  Shoulder flexion 4/5 4/5  Shoulder abduction 4/5 4/5  Shoulder adduction    Shoulder extension    Shoulder internal rotation    Shoulder external rotation    Middle trapezius    Lower trapezius    Elbow flexion 4+/5 4+/5  Elbow extension 4+/5 4+/5  Wrist flexion    Wrist extension 4+/5 4+/5  Wrist ulnar deviation    Wrist radial deviation    Wrist pronation    Wrist supination    (Blank rows = not tested)  HAND FUNCTION: Grip strength: Right: 25 lbs; Left: 15 lbs, Lateral pinch: Right: 13 lbs, Left: 10 lbs, and 3 point pinch: Right: 5 lbs, Left: 1028 lbs  COORDINATION: 9 Hole Peg test: Right: 28 sec; Left: 24 sec  SENSATION: WFL   COGNITION: Overall cognitive status: Per Yolanda Boone, Pt. Requires questions repeated at times.  VISION: Subjective report: No changes reported   VISION  ASSESSMENT:   Reports no change  PERCEPTION: WFL  PRAXIS: WFL                                                                                                                            TREATMENT DATE: 02/01/2023  Initial evaluation was completed   PATIENT EDUCATION: Education details: OT services, POC, and goals Person educated: Patient and Child(ren) Yolanda Boone Education method: Explanation, Demonstration, Tactile cues, and Verbal cues Education comprehension: verbalized understanding, returned demonstration, and needs further education  HOME EXERCISE PROGRAM:   Ongoing assess, and provide/update as needed.   GOALS: Goals reviewed with patient? Yes  SHORT TERM GOALS: Target date: 03/15/2023    Pt. Will be independent with HEPs for the BUE's Baseline: Eval: No current HEPs. Goal status: INITIAL   LONG TERM GOALS: Target date: 04/26/2023    Pt. Will improve BUE strength by 2 mm grades to  assist with UE functional reach during IADLs Baseline: Eval: See above MMT grid for objective data details. Pt with limited UE functional  reaching during IADLs. Goal status: INITIAL  2.  Pt. Will increase Bilateral grip strength by 5# to be able to securely hold objects.  Baseline: Eval: Right: 25# Left:15# Goal status: INITIAL  3.  Pt. Will increase right 3pt. Pinch strength by 3# to assist with fastening a bra. Baseline: Eval: R: 5#, Left: 10# Goal status: INITIAL  4.  Pt. Will improve bilateral right hand [facility] skills by 2 sec. in order to be able to manipulate small objects during ADls, and IADLs. Baseline: Eval: R: 28 sec. L:  sec. Goal status: INITIAL  5.  Pt. Will demonstrate Modified techniques/Adaptive techniques during ADLs/IADLs with supervision  Baseline: Eval: Does not currently use A/E. Goal status: INITIAL  6.  Pt. will increase FOTO score by 2 points to reflect Pt. perceived improvement with assessment specific ADL/IADL's.  Baseline: Eval: FOTO score 55, TR score: 57 Goal status: INITIAL  ASSESSMENT:  CLINICAL IMPRESSION:  Patient is a 63 y.o. female who was seen today for occupational therapy evaluation for Cerebral Hemorrhage, Cerebral Aneurysms. Pt. presents with chronic LLE pain, BUE weakness, and  impaired [facility] skills which limit her ability to engage in, and perform daily ADL tasks including: morning care tasks, light homemaking tasks, meal prep, and functional mobility. Pt.'s FOTO score is 55 with a TR score 57. Pt. will benefit from skilled OT services to work on improving BUE functioning, increasing engagement in, and maximizing independence with ADLs, IADL functioning while decreasing caregiver burden.  PERFORMANCE DEFICITS: in functional skills including ADLs, IADLs, coordination, dexterity, ROM, strength, Fine motor control, Gross motor control, decreased knowledge of use of DME, and UE functional use, cognitive skills including , and psychosocial skills including coping strategies and environmental adaptation.   IMPAIRMENTS: are limiting patient from ADLs, IADLs, and leisure.   CO-MORBIDITIES:  may have co-morbidities  that affects occupational performance. Patient will benefit from skilled OT to address above impairments and  improve overall function.  MODIFICATION OR ASSISTANCE TO COMPLETE EVALUATION: Min-Moderate modification of tasks or assist with assess necessary to complete an evaluation.  OT OCCUPATIONAL PROFILE AND HISTORY: Detailed assessment: Review of records and additional review of physical, cognitive, psychosocial history related to current functional performance.  CLINICAL DECISION MAKING: Moderate - several treatment options, min-mod task modification necessary  REHAB POTENTIAL: Good  EVALUATION COMPLEXITY: Moderate    PLAN:  OT FREQUENCY: 2x/week  OT DURATION: 12 weeks  PLANNED INTERVENTIONS: 97168 OT Re-evaluation, 97535 self care/ADL training, 02889 therapeutic exercise, 97530 therapeutic activity, 97112 neuromuscular re-education, 97140 manual therapy, 97018 paraffin, 02989 moist heat, 97010 cryotherapy, 97034 contrast bath, 97032 electrical stimulation (manual), patient/family education, and DME and/or AE instructions  RECOMMENDED OTHER SERVICES: PT, and ST  CONSULTED AND AGREED WITH PLAN OF CARE: Patient  PLAN FOR NEXT SESSION:   Richardson Otter, MS, OTR/L  02/01/2023, 11:23 AM

## 2023-02-07 ENCOUNTER — Ambulatory Visit: Payer: Medicare (Managed Care) | Admitting: Physical Therapy

## 2023-02-07 ENCOUNTER — Ambulatory Visit: Payer: Medicare (Managed Care) | Admitting: Occupational Therapy

## 2023-02-07 DIAGNOSIS — M6281 Muscle weakness (generalized): Secondary | ICD-10-CM

## 2023-02-07 DIAGNOSIS — R269 Unspecified abnormalities of gait and mobility: Secondary | ICD-10-CM

## 2023-02-07 DIAGNOSIS — R278 Other lack of coordination: Secondary | ICD-10-CM

## 2023-02-07 DIAGNOSIS — I6930 Unspecified sequelae of cerebral infarction: Secondary | ICD-10-CM | POA: Diagnosis not present

## 2023-02-07 NOTE — Therapy (Addendum)
 OUTPATIENT PHYSICAL THERAPY NEURO EVALUATION   Patient Name: Yolanda Boone MRN: [id number] DOB:[birth year], 63 y.o., female Today's Date: 02/07/2023   PCP: none  REFERRING PROVIDER: Myra Wanda LABOR, MD   END OF SESSION:  PT End of Session - 02/07/23 0747     Visit Number 1    Number of Visits 24    Date for PT Re-Evaluation 05/02/23    PT Start Time 0800    PT Stop Time 0845    PT Time Calculation (min) 45 min    Equipment Utilized During Treatment Gait belt    Activity Tolerance Patient tolerated treatment well    Behavior During Therapy [facility] for tasks assessed/performed             Past Medical History:  Diagnosis Date   Arthritis    generalized   Asthma    Hx   DVT (deep venous thrombosis) (HCC) 07/03/2021   BLE   Graves disease    Hypertension    Stroke (HCC) 06/20/2021   large SAH for left MCA aneursym, s/p coil embolization   Past Surgical History:  Procedure Laterality Date   ABDOMINAL HYSTERECTOMY  1991   fibroids removed   CARDIAC CATHETERIZATION     May 2023   CRANIOTOMY N/A 06/21/2021   Procedure: GERALYN WITH ANEURYSM COILING;  Surgeon: Lanis Pupa, MD;  Location: [facility] OR;  Service: Neurosurgery;  Laterality: N/A;   GASTRIC BYPASS  2012   IR 3D INDEPENDENT WKST  06/21/2021   IR ANGIO INTRA EXTRACRAN SEL COM CAROTID INNOMINATE UNI L MOD SED  01/13/2022   IR ANGIO INTRA EXTRACRAN SEL INTERNAL CAROTID BILAT MOD SED  06/21/2021   IR ANGIO INTRA EXTRACRAN SEL INTERNAL CAROTID UNI R MOD SED  01/13/2022   IR ANGIO VERTEBRAL SEL VERTEBRAL UNI L MOD SED  06/21/2021   IR ANGIOGRAM FOLLOW UP STUDY  06/21/2021   IR ANGIOGRAM FOLLOW UP STUDY  06/21/2021   IR ANGIOGRAM FOLLOW UP STUDY  01/18/2022   IR NEURO EACH ADD'L AFTER BASIC UNI LEFT (MS)  06/21/2021   IR NEURO EACH ADD'L AFTER BASIC UNI RIGHT (MS)  01/13/2022   IR TRANSCATH/EMBOLIZ  06/21/2021   IR TRANSCATH/EMBOLIZ  01/13/2022   KNEE ARTHROPLASTY Right 2022   RADIOLOGY WITH ANESTHESIA  N/A 01/13/2022   Procedure: Arteriogram, Pipeline Embolization of Aneurysm;  Surgeon: Lanis Pupa, MD;  Location: [facility] OR;  Service: Radiology;  Laterality: N/A;   Patient Active Problem List   Diagnosis Date Noted   Pain in limb 03/08/2022   Brain aneurysm 01/13/2022   Cerebral aneurysm 01/13/2022   DVT of lower extremity, bilateral (HCC) 07/04/2021   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, left (HCC) 06/22/2021   Acute respiratory failure (HCC) 06/22/2021   Compression of brain due to nontraumatic subarachnoid hemorrhage (HCC) 06/22/2021   Hx of aortic aneurysm 06/22/2021   Acute respiratory failure with hypoxia (HCC)    Hx of repair of dissecting thoracic aortic aneurysm, Stanford type B 06/02/2021   Closed fracture of fifth metatarsal bone 08/06/2020   Graves disease 10/08/2019   Essential hypertension 10/03/2012   Allergic rhinitis 12/19/2011    ONSET DATE: CVA in 2023  REFERRING DIAG:  Diagnosis  I69.30 (ICD-10-CM) - History of stroke with residual deficit    THERAPY DIAG:  Muscle weakness (generalized)  Other lack of coordination  Abnormality of gait  Rationale for Evaluation and Treatment: Rehabilitation  SUBJECTIVE:  SUBJECTIVE STATEMENT: Pt has verbal aphasia. Daughter present to provide hx.  Reports pain 9/10 in the L knee. Extremely guarded to movement and palpation. States that pt has not stood in several weeks to months. Daughter providing total assist for all hygiene and transfers.   Pt accompanied by: family member  PERTINENT HISTORY:  . Sustained a cerebral hemorrhage in 05/2021. Pt. Has had 2 surgeries for a cerebral aneurysm with the most recent occurring during hospitalization from 12/21-12/22/23. Pt. Has chronic left knee pain that she  consistently receives Cortizone shots for. Pt. has had a right TKR in the past, however is not a candidate for knee surgery on the left due to CVA/Aneurysm. Pt./daughter report hypersensitivity pain in the left LE, and foot. Per daughter -Pt. Has a History of right sided LE weakness from the CVA. PMHx includes: HTN, Graves Disease from the CVA.   PAIN:  Are you having pain? Yes: NPRS scale: 9/10 Pain location: L knee   Pain description: hurts cramping in  thigh and sharp in knee  Aggravating factors:   Relieving factors: muscle relaxors and volteren gel   PRECAUTIONS: Fall  RED FLAGS: None   WEIGHT BEARING RESTRICTIONS: No  FALLS: Has patient fallen in last 6 months? No  LIVING ENVIRONMENT: Lives with: lives with their family Lives in: House/apartment Stairs: No Has following equipment at home: Vannie - 2 wheeled, Environmental Consultant - 4 wheeled, Wheelchair (manual), shower chair, Ramped entry, and wanting to get hospital bed.  States that she has not used walker in about 5 months.  PLOF: Requires assistive device for independence, Needs assistance with ADLs, Needs assistance with homemaking, Needs assistance with transfers, and use of gait belt for mod-max assist transfers.   PATIENT GOALS: get muscle back so she can walk in home   OBJECTIVE:  Note: Objective measures were completed at Evaluation unless otherwise noted.  DIAGNOSTIC FINDINGS:  From 12/2021 MPRESSION: 1. 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   2. Continued occlusion of previously ruptured left middle cerebral artery bifurcation 6 months after coil embolization   3. Stable appearance of small bilateral posterior communicating artery aneurysms, as described above.  From 05/2021 IMPRESSION: Since the prior head CT of 06/20/2021, there has been coil embolization of a left MCA bifurcation aneurysm.    Persistent moderate-volume subarachnoid hemorrhage, greatest within the left sylvian fissure. New from the prior head CT, small-volume hemorrhage is present within the occipital horn of the right lateral ventricle, likely from interval redistribution. No definite hydrocephalus at this time.   Also new from the prior head CT, there is a 5.7 x 2.8 cm acute/early subacute cortical and subcortical left MCA territory infarct within the left frontal operculum and left insula/subinsular region.   Mass effect with 4 mm rightward midline shift, not significantly changed.  COGNITION: Overall cognitive status: Impaired and hx of aphasia, difficult to assess.    SENSATION: Light touch: WFL and able to detect all stimuli   COORDINATION: Limited due to strength and pain deficits   EDEMA:  Mild swelling in L knee per daughter.   MUSCLE TONE: reduced but tight HS on the LLE  MUSCLE LENGTH: Hamstrings: Right lacking 10 deg full extension in sitting  deg; Left lacking 15 deg full extension in sitting wwith pain    POSTURE: mild posterior bias when in pain   LOWER EXTREMITY ROM:    Limited assessment due to severe pain in the  L knee/thigh, with severe guarding  Passive  Right Eval Left Eval  Hip flexion    Hip extension    Hip abduction    Hip adduction    Hip internal rotation    Hip external rotation    Knee flexion    Knee extension    Ankle dorsiflexion    Ankle plantarflexion    Ankle inversion    Ankle eversion     (Blank rows = not tested)  LOWER EXTREMITY MMT:    MMT Right Eval Left Eval  Hip flexion 3 2  Hip extension 3 2  Hip abduction 3+ 3+  Hip adduction 3+ 3+  Hip internal rotation    Hip external rotation    Knee flexion 4- 3  Knee extension 2+ 2+  Ankle dorsiflexion 3 3  Ankle plantarflexion    Ankle inversion    Ankle eversion    (Blank rows = not tested) Severe pain in the LLE throughout testing limiting full assessment   BED MOBILITY:  To be  assessed at next session   TRANSFERS: Assistive device utilized:  none, slide board   Sit to stand: Total A Stand to sit: Total A Chair to chair: Mod A Floor:  unable   RAMP:  Level of Assistance:  in [facility] with assist from duaghter    CURB:  Level of Assistance:  unable to stand   STAIRS: Level of Assistance:  unable to stand   GAIT: Comments: unable to   FUNCTIONAL TESTS:  fist  Function In Sitting Test (FIST)  (1/2 femur on surface; hips/knees flexed to 90deg)   - indicate bed or mat table / step stool if used  SCORING KEY: 4 = Independent (completes task independently & successfully) 3 = Verbal Cues/Increased Time (completes task independently & successfully and only needs more time/cues) 2 = Upper Extremity Support (must use UE for support or assistance to complete successfully) 1 = Needs Assistance (unable to complete w/o physical assist; DOCUMENT LEVEL: min, mod, max) 0 = Dependent (requires complete physical assist; unable to complete successfully even w/ physical assist)  Randomly Administer Once Throughout Exam  ** - Anterior Nudge (superior sternum)  ** - Posterior Nudge (between scapular spines)  ** - Lateral Nudge (to dominant side at acromion)     4 - Static sitting (30 seconds)  ** - Sitting, shake 'no' (left and right)  ** - Sitting, eyes closed (30 seconds)   1 - Sitting, lift foot (dominant side, lift foot 1 inch twice)    4 - Pick up object from behind (object at midline, hands breadth posterior)  2 - Forward reach (use dominant arm, must complete full motion) 1 - Lateral reach (use dominant arm, clear opposite ischial tuberosity) 0 - Pick up object from floor (from between feet)   3 - Posterior scooting (move backwards 2 inches)  3 - Anterior scooting (move forward 2 inches)  3 - Lateral scooting (move to dominant side 2 inches)    TOTAL = TBD/56  Notes/comments: need to complete on session 2   MCD > 5 points MCID for IP REHAB > 6  points      PATIENT SURVEYS:  FOTO 11 goal 24  TREATMENT DATE:  Eval Only     PATIENT EDUCATION: Education details: POC.  Person educated: Patient and Child(ren) Education method: Medical Illustrator Education comprehension: verbalized understanding  HOME EXERCISE PROGRAM: To be given at session 2   GOALS: Goals reviewed with patient? Yes   SHORT TERM GOALS: Target date: 03/09/2023  Patient will be independent in home exercise program to improve strength/mobility for better functional independence with ADLs. Baseline: to be given on session 2 Goal status: INITIAL   LONG TERM GOALS: Target date: 05/04/2023   Patient will increase FOTO score to equal to or greater than 24    to demonstrate statistically significant improvement in mobility and quality of life.  Baseline: 11 Goal status: INITIAL  2.  Patient (> 63 years old) will come to standing  with heavy UE support with mod assist indicating an increased LE strength and improved balance. Baseline: unable to stand Goal status: INITIAL  3.  Patient will increase FIST by </6 points to demonstrate decreased fall risk during functional activities Baseline: to be completed on session 2  Goal status: INITIAL  4.  Patient will perform bed mobility with supervision assist to allow improved independence with hygiene and transfers. Baseline: total A Goal status: INITIAL  5.  Patient will transfer to [facility] with supervision assist to allow improved safety and independence with hygiene and acces to bed/wheelchair Baseline: mod-max assist lateral scoot  Goal status: INITIAL    ASSESSMENT:  CLINICAL IMPRESSION: Patient is a 63 y.o. 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  Pt demonstrates severe mobility impairments due to weakness and stregnthen deficits. Mod assist for lateral scoot transfer. Total A assist for sit<>stand. Daughter reports total A for bed mobility. Personal goal of standing, walking and transferring with RW again. Based on severe debility, requiring total A from daughter for all mobility and ADLs Skilled PT would be beneficial to improve overall QoL and allow return to PLOF; question if HHPT vs OPPT would be greater benefit at this time. Will continue to monitor.   OBJECTIVE IMPAIRMENTS: Abnormal gait, cardiopulmonary status limiting activity, decreased activity tolerance, decreased balance, decreased coordination, decreased endurance, decreased knowledge of condition, decreased knowledge of use of DME, decreased mobility, difficulty walking, decreased ROM, decreased strength, decreased safety awareness, hypomobility, increased edema, increased fascial restrictions, impaired perceived functional ability, increased muscle spasms, impaired flexibility, impaired sensation, impaired tone, impaired UE functional use, improper body mechanics, and postural dysfunction.   ACTIVITY LIMITATIONS: carrying, lifting, bending, sitting, standing, squatting, sleeping, stairs, transfers, bed mobility, continence, bathing, toileting, dressing, hygiene/grooming, and locomotion level  PARTICIPATION LIMITATIONS: meal prep, cleaning, laundry, medication management, personal finances, interpersonal relationship, driving, shopping, and community activity  PERSONAL FACTORS: Education, Fitness, Past/current experiences, Sex, and 1 comorbidity: hx of CVA and severe OA  are also affecting patient's functional outcome.   REHAB POTENTIAL: Fair pain sigificantly limiting mobility   CLINICAL DECISION MAKING: Evolving/moderate complexity  EVALUATION COMPLEXITY: High  PLAN:  PT FREQUENCY: 1-2x/week  PT DURATION: 12  weeks  PLANNED INTERVENTIONS: 97110-Therapeutic exercises, 97530- Therapeutic activity, [id number]- Neuromuscular re-education, 97535- Self Care, [id number]- Manual therapy, [id number]- Gait training, [id number]- Orthotic Fit/training, [id number]- Aquatic Therapy, [phone]- Splinting, [id number]- Subsequent splinting/medication, [id number]- Electrical stimulation (unattended), [phone]- Electrical stimulation (manual), [id number]- Ultrasound, Patient/Family education, Balance training, Stair training, Dry Needling, Joint mobilization, Joint  manipulation, Spinal manipulation, Spinal mobilization, Cognitive remediation, DME instructions, Wheelchair mobility training, Cryotherapy, and Moist heat  PLAN FOR NEXT SESSION:  Completed fist.  Bed mobility    Massie FORBES Dollar, PT 02/07/2023, 7:48 AM

## 2023-02-07 NOTE — Therapy (Addendum)
 OUTPATIENT OCCUPATIONAL THERAPY NEURO TREATMENT NOTE  Patient Name: Yolanda Boone MRN: [id number] DOB:09/17/1960, 63 y.o., female Today's Date: 02/07/2023   REFERRING PROVIDER: Myra Wanda FELIX MD  END OF SESSION:  OT End of Session - 02/07/23 0848     Visit Number 2    Number of Visits 24    Date for OT Re-Evaluation 04/26/23    OT Start Time 0845    OT Stop Time 0930    OT Time Calculation (min) 45 min    Activity Tolerance Patient tolerated treatment well    Behavior During Therapy [facility] for tasks assessed/performed             Past Medical History:  Diagnosis Date   Arthritis    generalized   Asthma    Hx   DVT (deep venous thrombosis) (HCC) 07/03/2021   BLE   Graves disease    Hypertension    Stroke (HCC) 06/20/2021   large SAH for left MCA aneursym, s/p coil embolization   Past Surgical History:  Procedure Laterality Date   ABDOMINAL HYSTERECTOMY  1991   fibroids removed   CARDIAC CATHETERIZATION     May 2023   CRANIOTOMY N/A 06/21/2021   Procedure: GERALYN WITH ANEURYSM COILING;  Surgeon: Lanis Pupa, MD;  Location: [facility] OR;  Service: Neurosurgery;  Laterality: N/A;   GASTRIC BYPASS  2012   IR 3D INDEPENDENT WKST  06/21/2021   IR ANGIO INTRA EXTRACRAN SEL COM CAROTID INNOMINATE UNI L MOD SED  01/13/2022   IR ANGIO INTRA EXTRACRAN SEL INTERNAL CAROTID BILAT MOD SED  06/21/2021   IR ANGIO INTRA EXTRACRAN SEL INTERNAL CAROTID UNI R MOD SED  01/13/2022   IR ANGIO VERTEBRAL SEL VERTEBRAL UNI L MOD SED  06/21/2021   IR ANGIOGRAM FOLLOW UP STUDY  06/21/2021   IR ANGIOGRAM FOLLOW UP STUDY  06/21/2021   IR ANGIOGRAM FOLLOW UP STUDY  01/18/2022   IR NEURO EACH ADD'L AFTER BASIC UNI LEFT (MS)  06/21/2021   IR NEURO EACH ADD'L AFTER BASIC UNI RIGHT (MS)  01/13/2022   IR TRANSCATH/EMBOLIZ  06/21/2021   IR TRANSCATH/EMBOLIZ  01/13/2022   KNEE ARTHROPLASTY Right 2022   RADIOLOGY WITH ANESTHESIA N/A 01/13/2022   Procedure: Arteriogram, Pipeline  Embolization of Aneurysm;  Surgeon: Lanis Pupa, MD;  Location: [facility] OR;  Service: Radiology;  Laterality: N/A;   Patient Active Problem List   Diagnosis Date Noted   Pain in limb 03/08/2022   Brain aneurysm 01/13/2022   Cerebral aneurysm 01/13/2022   DVT of lower extremity, bilateral (HCC) 07/04/2021   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, left (HCC) 06/22/2021   Acute respiratory failure (HCC) 06/22/2021   Compression of brain due to nontraumatic subarachnoid hemorrhage (HCC) 06/22/2021   Hx of aortic aneurysm 06/22/2021   Acute respiratory failure with hypoxia (HCC)    Hx of repair of dissecting thoracic aortic aneurysm, Stanford type B 06/02/2021   Closed fracture of fifth metatarsal bone 08/06/2020   Graves disease 10/08/2019   Essential hypertension 10/03/2012   Allergic rhinitis 12/19/2011    ONSET DATE: 06/18/21  REFERRING DIAG:  Cerebral hemorrhage  THERAPY DIAG:  Muscle weakness (generalized)  Rationale for Evaluation and Treatment: Rehabilitation  SUBJECTIVE:   SUBJECTIVE STATEMENT: Pt.'s daughter report getting up at 2:30 this morning, and the Pt. At 3:30 this morning in order to get ready to come to therapy this morning. Pt accompanied by:  Daughter  PERTINENT HISTORY: Pt. Sustained a cerebral hemorrhage in 05/2021. Pt. Has had  2 surgeries for a cerebral aneurysm with the most recent occurring during hospitalization from 12/21-12/22/23. Pt. Has chronic left knee pain that she consistently receives Cortizone shots for. Pt. has had a right TKR in the past, however is not a  candidate for knee surgery on the left due  to CVA/Aneurysm. Pt./daughter report hypersensitivity pain in the left LE, and foot. Per daughter -Pt. Has a History of right sided LE weakness from the CVA. PMHx includes: HTN, Graves Disease from the CVA.   PRECAUTIONS: None  WEIGHT BEARING RESTRICTIONS: No  PAIN:  Are you having pain? Yes 2/10 left knee, and foot at  initial eval. Reports increased hypersensitivity pain earlier in the morning. Daughter reports Pt. had difficulty getting in and out of the car this morning due to pain.  FALLS: Has patient fallen in last 6 months? No  LIVING ENVIRONMENT: Lives with: lives with their daughter Lives in: House/apartment Stairs: ramped entrance,  one floor Has following equipment at home: Single point cane, Walker - 2 wheeled, Wheelchair (manual), shower chair, and bed side commode  PLOF: Independent  PATIENT GOALS: To regain muscles.  OBJECTIVE:  Note: Objective measures were completed at Evaluation unless otherwise noted.  HAND DOMINANCE: Right  ADLs:  Transfers/ambulation related to ADLs: Eating: Independent Grooming: Independent with oral care; Duaghter assists with hair care UB Dressing: Independent with donning shirt, jacket. Daughter assists with bra LB Dressing: Daughter assists with LE dressing Toileting: Assist with BSC transfers Bathing: Daughter assists with bathing Tub Shower transfers: Bed baths, unable to get through the bathroom door   IADLs: Shopping: Daughter assists Light housekeeping: Total A Meal Prep: Total A Community mobility: Relies on daughter Medication management: Daughter provided set-up  Financial management: No change   MOBILITY STATUS: Needs Assist: 2/2 pain   ACTIVITY TOLERANCE: Activity tolerance:  Limited activity tolerance  FUNCTIONAL OUTCOME MEASURES: FOTO: 55, TR score: 57  UPPER EXTREMITY ROM:  History of left shoulder injury  Active ROM Right Eval WFL Left Eval [facility]  Shoulder flexion    Shoulder abduction    Shoulder adduction    Shoulder extension    Shoulder internal rotation    Shoulder external rotation    Elbow flexion    Elbow extension    Wrist flexion    Wrist extension    Wrist ulnar deviation    Wrist radial deviation    Wrist pronation    Wrist supination    (Blank rows = not tested)  UPPER EXTREMITY MMT:     MMT  Right eval Left eval  Shoulder flexion 4/5 4/5  Shoulder abduction 4/5 4/5  Shoulder adduction    Shoulder extension    Shoulder internal rotation    Shoulder external rotation    Middle trapezius    Lower trapezius    Elbow flexion 4+/5 4+/5  Elbow extension 4+/5 4+/5  Wrist flexion    Wrist extension 4+/5 4+/5  Wrist ulnar deviation    Wrist radial deviation    Wrist pronation    Wrist supination    (Blank rows = not tested)  HAND FUNCTION: Grip strength: Right: 25 lbs; Left: 15 lbs, Lateral pinch: Right: 13 lbs, Left: 10 lbs, and 3 point pinch: Right: 5 lbs, Left: 1028 lbs  COORDINATION: 9 Hole Peg test: Right: 28 sec; Left: 24 sec  SENSATION: WFL   COGNITION: Overall cognitive status: Per daughter, Pt. Requires questions repeated at times.  VISION: Subjective report: No changes reported   VISION ASSESSMENT:   Reports  no change  PERCEPTION: WFL  PRAXIS: WFL                                                                                                                            TREATMENT DATE: 02/07/2023  Therapeutic Ex:  Pt. performed 2# dowel ex. For UE strengthening secondary to weakness. Bilateral shoulder flexion, chest press, circular patterns were performed for 2 set 10 reps each.  Pt. Performed bilateral alternating vertical dowel climbs. Pt. Performed 2# dumbbell ex. for elbow flexion and extension, forearm supination/pronation, wrist flexion/extension, and radial deviation for 1 set 10 reps each. Pt. requires rest breaks and verbal cues for proper technique.  Pt. Worked on yellow thearputty ex. for hand strengthening. Exercises included: gross gripping, gross digit extension, lateral, and 3pt. Pinch strengthening, and thumb opposition. Pt. Was provided with a visual handout HEP through [employer].      PATIENT EDUCATION: Education details: OT services, POC, and goals Person educated: Patient and Child(ren) Daughter Education method: Explanation,  Demonstration, Tactile cues, and Verbal cues Education comprehension: verbalized understanding, returned demonstration, and needs further education  HOME EXERCISE PROGRAM:   Ongoing assess, and provide/update as needed.   GOALS: Goals reviewed with patient? Yes  SHORT TERM GOALS: Target date: 03/15/2023    Pt. Will be independent with HEPs for the BUE's Baseline: Eval: No current HEPs. Goal status: INITIAL   LONG TERM GOALS: Target date: 04/26/2023    Pt. Will improve BUE strength by 2 mm grades to  assist with UE functional reach during IADLs Baseline: Eval: See above MMT grid for objective data details. Pt with limited UE functional reaching during IADLs. Goal status: INITIAL  2.  Pt. Will increase Bilateral grip strength by 5# to be able to securely hold objects.  Baseline: Eval: Right: 25# Left:15# Goal status: INITIAL  3.  Pt. Will increase right 3pt. Pinch strength by 3# to assist with fastening a bra. Baseline: Eval: R: 5#, Left: 10# Goal status: INITIAL  4.  Pt. Will improve bilateral right hand [facility] skills by 2 sec.in  Order to be able to manipulate small objects during ADLs, and IADLs. Baseline: Eval: R: 28 sec. L:  sec. Goal status: INITIAL  5.  Pt. Will demonstrate Modified techniques/Adaptive techniques during ADLs/IADLs with supervision  Baseline: Eval: Does not currently use A/E. Goal status: INITIAL  6.  Pt. will increase FOTO score by 2 points to reflect Pt. perceived improvement with assessment specific ADL/IADL's.  Baseline: Eval: FOTO score 55, TR score: 57 Goal status: INITIAL  ASSESSMENT:  CLINICAL IMPRESSION:  Patient tolerated the UE exercises well, however continues to present with 10/10 LLE pain. Pt. requires cues for proper technique, form, and pace for each of the exercises. Pt. Presented with nausea, and vomiting when the session ended this morning. Pt. and daughter were assisted the the care, ensuring the Pt. Is as comfortable as possible.  Pt. continues to benefit from skilled OT services to work on improving  BUE functioning, increasing engagement in, and maximizing independence with ADLs, IADL functioning while decreasing caregiver burden.  PERFORMANCE DEFICITS: in functional skills including ADLs, IADLs, coordination, dexterity, ROM, strength, Fine motor control, Gross motor control, decreased knowledge of use of DME, and UE functional use, cognitive skills including , and psychosocial skills including coping strategies and environmental adaptation.   IMPAIRMENTS: are limiting patient from ADLs, IADLs, and leisure.   CO-MORBIDITIES: may have co-morbidities  that affects occupational performance. Patient will benefit from skilled OT to address above impairments and improve overall function.  MODIFICATION OR ASSISTANCE TO COMPLETE EVALUATION: Min-Moderate modification of tasks or assist with assess necessary to complete an evaluation.  OT OCCUPATIONAL PROFILE AND HISTORY: Detailed assessment: Review of records and additional review of physical, cognitive, psychosocial history related to current functional performance.  CLINICAL DECISION MAKING: Moderate - several treatment options, min-mod task modification necessary  REHAB POTENTIAL: Good  EVALUATION COMPLEXITY: Moderate    PLAN:  OT FREQUENCY: 2x/week  OT DURATION: 12 weeks  PLANNED INTERVENTIONS: 97168 OT Re-evaluation, 97535 self care/ADL training, 02889 therapeutic exercise, 97530 therapeutic activity, 97112 neuromuscular re-education, 97140 manual therapy, 97018 paraffin, 02989 moist heat, 97010 cryotherapy, 97034 contrast bath, 97032 electrical stimulation (manual), patient/family education, and DME and/or AE instructions  RECOMMENDED OTHER SERVICES: PT, and ST  CONSULTED AND AGREED WITH PLAN OF CARE: Patient  PLAN FOR NEXT SESSION:   Richardson Otter, MS, OTR/L  02/07/2023, 8:50 AM

## 2023-02-08 ENCOUNTER — Encounter: Payer: Self-pay | Admitting: Physical Therapy

## 2023-02-09 ENCOUNTER — Ambulatory Visit: Payer: Medicare (Managed Care)

## 2023-02-09 ENCOUNTER — Ambulatory Visit: Payer: Medicare (Managed Care) | Admitting: Physical Therapy

## 2023-02-09 DIAGNOSIS — I6930 Unspecified sequelae of cerebral infarction: Secondary | ICD-10-CM

## 2023-02-09 DIAGNOSIS — R269 Unspecified abnormalities of gait and mobility: Secondary | ICD-10-CM

## 2023-02-09 DIAGNOSIS — R4701 Aphasia: Secondary | ICD-10-CM

## 2023-02-09 DIAGNOSIS — M6281 Muscle weakness (generalized): Secondary | ICD-10-CM

## 2023-02-09 DIAGNOSIS — R41841 Cognitive communication deficit: Secondary | ICD-10-CM

## 2023-02-09 DIAGNOSIS — R278 Other lack of coordination: Secondary | ICD-10-CM

## 2023-02-09 NOTE — Therapy (Signed)
OUTPATIENT PHYSICAL THERAPY NEURO TREATMENT   Patient Name: Yolanda Boone MRN: [id number] DOB:04-Mar-1960, 63 y.o., female Today's Date: 02/09/2023   PCP: none  REFERRING PROVIDER: Karle Starch, MD   END OF SESSION:  PT End of Session - 02/09/23 1118     Visit Number 2    Number of Visits 24    Date for PT Re-Evaluation 05/02/23    PT Start Time 0847    PT Stop Time 0930    PT Time Calculation (min) 43 min    Equipment Utilized During Treatment Gait belt    Activity Tolerance Patient limited by pain    Behavior During Therapy [facility] for tasks assessed/performed              Past Medical History:  Diagnosis Date   Arthritis    generalized   Asthma    Hx   DVT (deep venous thrombosis) (HCC) 07/03/2021   BLE   Graves disease    Hypertension    Stroke (HCC) 06/20/2021   large SAH for left MCA aneursym, s/p coil embolization   Past Surgical History:  Procedure Laterality Date   ABDOMINAL HYSTERECTOMY  1991   fibroids removed   CARDIAC CATHETERIZATION     May 2023   CRANIOTOMY N/A 06/21/2021   Procedure: Rosalin Hawking WITH ANEURYSM COILING;  Surgeon: Lisbeth Renshaw, MD;  Location: [facility] OR;  Service: Neurosurgery;  Laterality: N/A;   GASTRIC BYPASS  2012   IR 3D INDEPENDENT WKST  06/21/2021   IR ANGIO INTRA EXTRACRAN SEL COM CAROTID INNOMINATE UNI L MOD SED  01/13/2022   IR ANGIO INTRA EXTRACRAN SEL INTERNAL CAROTID BILAT MOD SED  06/21/2021   IR ANGIO INTRA EXTRACRAN SEL INTERNAL CAROTID UNI R MOD SED  01/13/2022   IR ANGIO VERTEBRAL SEL VERTEBRAL UNI L MOD SED  06/21/2021   IR ANGIOGRAM FOLLOW UP STUDY  06/21/2021   IR ANGIOGRAM FOLLOW UP STUDY  06/21/2021   IR ANGIOGRAM FOLLOW UP STUDY  01/18/2022   IR NEURO EACH ADD'L AFTER BASIC UNI LEFT (MS)  06/21/2021   IR NEURO EACH ADD'L AFTER BASIC UNI RIGHT (MS)  01/13/2022   IR TRANSCATH/EMBOLIZ  06/21/2021   IR TRANSCATH/EMBOLIZ  01/13/2022   KNEE ARTHROPLASTY Right 2022   RADIOLOGY WITH ANESTHESIA N/A  01/13/2022   Procedure: Arteriogram, Pipeline Embolization of Aneurysm;  Surgeon: Lisbeth Renshaw, MD;  Location: [facility] OR;  Service: Radiology;  Laterality: N/A;   Patient Active Problem List   Diagnosis Date Noted   Pain in limb 03/08/2022   Brain aneurysm 01/13/2022   Cerebral aneurysm 01/13/2022   DVT of lower extremity, bilateral (HCC) 07/04/2021   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, left (HCC) 06/22/2021   Acute respiratory failure (HCC) 06/22/2021   Compression of brain due to nontraumatic subarachnoid hemorrhage (HCC) 06/22/2021   Hx of aortic aneurysm 06/22/2021   Acute respiratory failure with hypoxia (HCC)    Hx of repair of dissecting thoracic aortic aneurysm, Stanford type B 06/02/2021   Closed fracture of fifth metatarsal bone 08/06/2020   Graves disease 10/08/2019   Essential hypertension 10/03/2012   Allergic rhinitis 12/19/2011    ONSET DATE: CVA in 2023  REFERRING DIAG:  Diagnosis  I69.30 (ICD-10-CM) - History of stroke with residual deficit    THERAPY DIAG:  Other lack of coordination  Muscle weakness (generalized)  Abnormality of gait  History of stroke with residual deficit  Rationale for Evaluation and Treatment: Rehabilitation  SUBJECTIVE:  SUBJECTIVE STATEMENT: Pt has verbal aphasia. Daughter present to provide report. Patient and daughter, state that she has been moving a little better since last PT visit. Was able to roll in bed without assist and perform bed mobility with mod assist to EOB.  Pt accompanied by: family member  PERTINENT HISTORY:  . Sustained a cerebral hemorrhage in 05/2021. Pt. Has had 2 surgeries for a cerebral aneurysm with the most recent occurring during hospitalization from 12/21-12/22/23. Pt. Has chronic left knee  pain that she consistently receives Cortizone shots for. Pt. has had a right TKR in the past, however is not a candidate for knee surgery on the left due to CVA/Aneurysm. Pt./daughter report hypersensitivity pain in the left LE, and foot. Per daughter -Pt. Has a History of right sided LE weakness from the CVA. PMHx includes: HTN, Graves Disease from the CVA.   PAIN:  Are you having pain? Yes: NPRS scale: 9/10 Pain location: L knee   Pain description: "hurts" cramping in  thigh and sharp in knee  Aggravating factors:   Relieving factors: muscle relaxors and volteren gel   PRECAUTIONS: Fall  RED FLAGS: None   WEIGHT BEARING RESTRICTIONS: No  FALLS: Has patient fallen in last 6 months? No  LIVING ENVIRONMENT: Lives with: lives with their family Lives in: House/apartment Stairs: No Has following equipment at home: Dan Humphreys - 2 wheeled, Environmental consultant - 4 wheeled, Wheelchair (manual), shower chair, Ramped entry, and wanting to get hospital bed.  States that she has not used walker in about 5 months.  PLOF: Requires assistive device for independence, Needs assistance with ADLs, Needs assistance with homemaking, Needs assistance with transfers, and use of gait belt for mod-max assist transfers.   PATIENT GOALS: get muscle back so she can walk in home   OBJECTIVE:  Note: Objective measures were completed at Evaluation unless otherwise noted.  DIAGNOSTIC FINDINGS:  From 12/2021 MPRESSION: 1. 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   2. Continued occlusion of previously ruptured left middle cerebral artery bifurcation 6 months after coil embolization   3. Stable appearance of small bilateral posterior communicating artery aneurysms, as described above.  From 05/2021 IMPRESSION: Since the prior head CT of 06/20/2021, there has been coil embolization of a left MCA bifurcation  aneurysm.   Persistent moderate-volume subarachnoid hemorrhage, greatest within the left sylvian fissure. New from the prior head CT, small-volume hemorrhage is present within the occipital horn of the right lateral ventricle, likely from interval redistribution. No definite hydrocephalus at this time.   Also new from the prior head CT, there is a 5.7 x 2.8 cm acute/early subacute cortical and subcortical left MCA territory infarct within the left frontal operculum and left insula/subinsular region.   Mass effect with 4 mm rightward midline shift, not significantly changed.  COGNITION: Overall cognitive status: Impaired and hx of aphasia, difficult to assess.    SENSATION: Light touch: WFL and able to detect all stimuli   COORDINATION: Limited due to strength and pain deficits   EDEMA:  Mild swelling in L knee per daughter.   MUSCLE TONE: reduced but tight HS on the LLE  MUSCLE LENGTH: Hamstrings: Right lacking 10 deg full extension in sitting  deg; Left lacking 15 deg full extension in sitting wwith pain    POSTURE: mild posterior bias when in pain   LOWER EXTREMITY ROM:    Limited assessment due to severe pain in the L knee/thigh,  with severe guarding  Passive  Right Eval Left Eval  Hip flexion    Hip extension    Hip abduction    Hip adduction    Hip internal rotation    Hip external rotation    Knee flexion    Knee extension    Ankle dorsiflexion    Ankle plantarflexion    Ankle inversion    Ankle eversion     (Blank rows = not tested)  LOWER EXTREMITY MMT:    MMT Right Eval Left Eval  Hip flexion 3 2  Hip extension 3 2  Hip abduction 3+ 3+  Hip adduction 3+ 3+  Hip internal rotation    Hip external rotation    Knee flexion 4- 3  Knee extension 2+ 2+  Ankle dorsiflexion 3 3  Ankle plantarflexion    Ankle inversion    Ankle eversion    (Blank rows = not tested) Severe pain in the LLE throughout testing limiting full assessment   BED  MOBILITY:  To be assessed at next session   TRANSFERS: Assistive device utilized:  none, slide board   Sit to stand: Total A Stand to sit: Total A Chair to chair: Mod A Floor:  unable   RAMP:  Level of Assistance:  in [facility] with assist from duaghter    CURB:  Level of Assistance:  unable to stand   STAIRS: Level of Assistance:  unable to stand   GAIT: Comments: unable to   FUNCTIONAL TESTS:  fist  Function In Sitting Test (FIST)  (1/2 femur on surface; hips/knees flexed to 90deg)   - indicate bed or mat table / step stool if used  SCORING KEY: 4 = Independent (completes task independently & successfully) 3 = Verbal Cues/Increased Time (completes task independently & successfully and only needs more time/cues) 2 = Upper Extremity Support (must use UE for support or assistance to complete successfully) 1 = Needs Assistance (unable to complete w/o physical assist; DOCUMENT LEVEL: min, mod, max) 0 = Dependent (requires complete physical assist; unable to complete successfully even w/ physical assist)  Randomly Administer Once Throughout Exam  2 - Anterior Nudge (superior sternum)  2 - Posterior Nudge (between scapular spines)  2 - Lateral Nudge (to dominant side at acromion)     4 - Static sitting (30 seconds)  4 - Sitting, shake 'no' (left and right)  4- Sitting, eyes closed (30 seconds)   1 - Sitting, lift foot (dominant side, lift foot 1 inch twice)    4 - Pick up object from behind (object at midline, hands breadth posterior)  2 - Forward reach (use dominant arm, must complete full motion) 1 - Lateral reach (use dominant arm, clear opposite ischial tuberosity) 0 - Pick up object from floor (from between feet)   3 - Posterior scooting (move backwards 2 inches)  3 - Anterior scooting (move forward 2 inches)  3 - Lateral scooting (move to dominant side 2 inches)    TOTAL =35/56  Notes/comments: need to complete on session 2   MCD > 5 points MCID for IP REHAB > 6  points      PATIENT SURVEYS:  FOTO 11 goal 24  TREATMENT DATE:  Completed FIST. See above.   Bed mobility with supervision assist for rolling. Mod-max assist from PT for sit<>supine.   Supine SAQ AAROM x 5 bil  Hip flexion/march with AAROM on the LLE AROM on the RLE x 6 bil  Hip abduction/adduction in hooklying x8 bil Partial bridge x 4. Unable to clear pelvis from bed.  Lateral scoot x 6 bil   Attempted sit<>stand in parallel bars. Pt extremely resistant to standing and was noted to se BUE to push posteriorly to prevent transition to WB through feet.      PATIENT EDUCATION: Education details: POC. HHPT vs OPPT. Pt and family open to transition to HHPT Person educated: Patient and Child(ren) Education method: Medical illustrator Education comprehension: verbalized understanding   HOME EXERCISE PROGRAM: To be given at session 2    GOALS: Goals reviewed with patient? Yes  SHORT TERM GOALS: Target date: 03/09/2023   Patient will be independent in home exercise program to improve strength/mobility for better functional independence with ADLs. Baseline: to be given on session 2 Goal status: INITIAL     LONG TERM GOALS: Target date: 05/04/2023     Patient will increase FOTO score to equal to or greater than 24    to demonstrate statistically significant improvement in mobility and quality of life.  Baseline: 11 Goal status: INITIAL   2.  Patient (> 6 years old) will come to standing  with heavy UE support with mod assist indicating an increased LE strength and improved balance. Baseline: unable to stand Goal status: INITIAL   3.  Patient will increase FIST by </6 points to demonstrate decreased fall risk during functional activities Baseline: 35  Goal status: INITIAL   4.  Patient will perform bed mobility with supervision assist to  allow improved independence with hygiene and transfers. Baseline: total A Goal status: INITIAL   5.  Patient will transfer to [facility] with supervision assist to allow improved safety and independence with hygiene and acces to bed/wheelchair Baseline: mod-max assist lateral scoot  Goal status: INITIAL      ASSESSMENT:  CLINICAL IMPRESSION: Patient is a 63 y.o. 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 PT completed Fist and bed mobility assessment. Severe balance deficits noted with ist 35/56. Unable to attain partial stand in parallel bars on this day Total A. Pt and daughter open to transition to HHPT and other disciples; will need to confirm with team.  Skilled PT would be beneficial to improve overall QoL and allow return to PLOF; question if HHPT vs OPPT would be greater benefit at this time. Will continue to monitor.   OBJECTIVE IMPAIRMENTS: Abnormal gait, cardiopulmonary status limiting activity, decreased activity tolerance, decreased balance, decreased coordination, decreased endurance, decreased knowledge of condition, decreased knowledge of use of DME, decreased mobility, difficulty walking, decreased ROM, decreased strength, decreased safety awareness, hypomobility, increased edema, increased fascial restrictions, impaired perceived functional ability, increased muscle spasms, impaired flexibility, impaired sensation, impaired tone, impaired UE functional use, improper body mechanics, and postural dysfunction.   ACTIVITY LIMITATIONS: carrying, lifting, bending, sitting, standing, squatting, sleeping, stairs, transfers, bed mobility, continence, bathing, toileting, dressing, hygiene/grooming, and locomotion level  PARTICIPATION LIMITATIONS: meal prep, cleaning, laundry, medication management, personal finances, interpersonal  relationship, driving, shopping, and community activity  PERSONAL FACTORS: Education, Fitness, Past/current experiences, Sex, and  1 comorbidity: hx of CVA and severe OA  are also affecting patient's functional outcome.   REHAB POTENTIAL: Fair pain sigificantly limiting mobility   CLINICAL DECISION MAKING: Evolving/moderate complexity  EVALUATION COMPLEXITY: High  PLAN:  PT FREQUENCY: 1-2x/week  PT DURATION: 12 weeks  PLANNED INTERVENTIONS: 97110-Therapeutic exercises, 97530- Therapeutic activity, [id number]- Neuromuscular re-education, 97535- Self Care, [id number]- Manual therapy, [phone]- Gait training, [phone]- Orthotic Fit/training, [phone]- Aquatic Therapy, [phone]- Splinting, [id number]- Subsequent splinting/medication, [id number]- Electrical stimulation (unattended), [phone]- Electrical stimulation (manual), [id number]- Ultrasound, Patient/Family education, Balance training, Stair training, Dry Needling, Joint mobilization, Joint manipulation, Spinal manipulation, Spinal mobilization, Cognitive remediation, DME instructions, Wheelchair mobility training, Cryotherapy, and Moist heat  PLAN FOR NEXT SESSION:  Bed level and seated HEP.  Attempt standing.  Initiate referral for HHPT?   Golden Pop, PT 02/09/2023, 11:21 AM

## 2023-02-09 NOTE — Therapy (Signed)
OUTPATIENT SPEECH LANGUAGE PATHOLOGY APHASIA EVALUATION   Patient Name: Yolanda Boone MRN: [id number] DOB:06-03-1960, 63 y.o., female Today's Date: 02/09/2023  PCP: none listed  REFERRING PROVIDER: Landry Dyke, MD   End of Session - 02/09/23 0933     Visit Number 2    Number of Visits 24    Date for SLP Re-Evaluation 04/26/23    SLP Start Time 0755    SLP Stop Time  0845    SLP Time Calculation (min) 50 min             Past Medical History:  Diagnosis Date   Arthritis    generalized   Asthma    Hx   DVT (deep venous thrombosis) (HCC) 07/03/2021   BLE   Graves disease    Hypertension    Stroke (HCC) 06/20/2021   large SAH for left MCA aneursym, s/p coil embolization   Past Surgical History:  Procedure Laterality Date   ABDOMINAL HYSTERECTOMY  1991   fibroids removed   CARDIAC CATHETERIZATION     May 2023   CRANIOTOMY N/A 06/21/2021   Procedure: Rosalin Hawking WITH ANEURYSM COILING;  Surgeon: Lisbeth Renshaw, MD;  Location: [facility] OR;  Service: Neurosurgery;  Laterality: N/A;   GASTRIC BYPASS  2012   IR 3D INDEPENDENT WKST  06/21/2021   IR ANGIO INTRA EXTRACRAN SEL COM CAROTID INNOMINATE UNI L MOD SED  01/13/2022   IR ANGIO INTRA EXTRACRAN SEL INTERNAL CAROTID BILAT MOD SED  06/21/2021   IR ANGIO INTRA EXTRACRAN SEL INTERNAL CAROTID UNI R MOD SED  01/13/2022   IR ANGIO VERTEBRAL SEL VERTEBRAL UNI L MOD SED  06/21/2021   IR ANGIOGRAM FOLLOW UP STUDY  06/21/2021   IR ANGIOGRAM FOLLOW UP STUDY  06/21/2021   IR ANGIOGRAM FOLLOW UP STUDY  01/18/2022   IR NEURO EACH ADD'L AFTER BASIC UNI LEFT (MS)  06/21/2021   IR NEURO EACH ADD'L AFTER BASIC UNI RIGHT (MS)  01/13/2022   IR TRANSCATH/EMBOLIZ  06/21/2021   IR TRANSCATH/EMBOLIZ  01/13/2022   KNEE ARTHROPLASTY Right 2022   RADIOLOGY WITH ANESTHESIA N/A 01/13/2022   Procedure: Arteriogram, Pipeline Embolization of Aneurysm;  Surgeon: Lisbeth Renshaw, MD;  Location: [facility] OR;  Service: Radiology;  Laterality: N/A;    Patient Active Problem List   Diagnosis Date Noted   Pain in limb 03/08/2022   Brain aneurysm 01/13/2022   Cerebral aneurysm 01/13/2022   DVT of lower extremity, bilateral (HCC) 07/04/2021   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, left (HCC) 06/22/2021   Acute respiratory failure (HCC) 06/22/2021   Compression of brain due to nontraumatic subarachnoid hemorrhage (HCC) 06/22/2021   Hx of aortic aneurysm 06/22/2021   Acute respiratory failure with hypoxia (HCC)    Hx of repair of dissecting thoracic aortic aneurysm, Stanford type B 06/02/2021   Closed fracture of fifth metatarsal bone 08/06/2020   Graves disease 10/08/2019   Essential hypertension 10/03/2012   Allergic rhinitis 12/19/2011    ONSET DATE: 12/16/22 (referral date); stroke 06/21/22  REFERRING DIAG: history of stroke with residual deficits  THERAPY DIAG:  No diagnosis found.  Rationale for Evaluation and Treatment Rehabilitation  SUBJECTIVE:   SUBJECTIVE STATEMENT: Pt alert, pleasant, and cooperative. Eager to participate in ST. Pt accompanied by: family member - daughter  PERTINENT HISTORY: 63 y.o. female with hx of large L SAH s/p craniotomy (May 2023) and embolization of R ICA aneurysm and coil embolization of L MCA aneursym (December 2023).   DIAGNOSTIC FINDINGS: CT angio 09/15/22 "1. Satisfactory  post endovascular treated appearance of the Right ICA siphon and Left MCA bifurcation aneurysms. No residual aneurysmal enhancement.   2. Fetal Left PCA origin with suggestion of a tiny anterior saccular vascular outpouching or vestigial vessel from the same Left Pcomm infundibulum, see series 13, image 90. This is not definitely identified on the previous conventional angiograms. Recommend attention on follow-up cerebral angiogram.   3. No other intracranial aneurysm. Mild intracranial atherosclerosis. No intracranial large vessel occlusion.   4. Resolved intracranial hemorrhage. Post  hemorrhage mild ventricular enlargement and encephalomalacia of the left insula/operculum. No new intracranial abnormality by CT."  PAIN:  Are you having pain? No  FALLS: Has patient fallen in last 6 months?  No  LIVING ENVIRONMENT: Lives with: lives with their daughter Lives in: House/apartment  PLOF:  Level of assistance: Needed assistance with ADLs, Needed assistance with IADLS Employment: On disability   PATIENT GOALS    to improve communication   OBJECTIVE:  Pt participated in further diagnostic tx as below.   PATIENT REPORTED OUTCOME MEASURES (PROM):  The Communication Effectiveness Survey is a patient-reported outcome measure in which the patient rates their own effectiveness in different communication situations. A higher score indicates greater effectiveness.   Pt's self-rating was 21/32.   Having a conversation with a family member or friends at home. 3 Participating in conversation with strangers in a quiet place. 3 Conversing with a familiar person over the telephone. 2 Conversing with a stranger over the telephone. 3 Being part of a conversation in a noisy environment (social gathering). 3 Speaking to a friend when you are emotionally upset or you are angry. 2 Having a conversation while traveling in a car. 2 Having a conversation with someone at a distance (across a room). 3  STANDARDIZED ASSESSMENTS:  STANDARDIZED ASSESSMENTS: WAB-R Part II   Reading:  A. Comprehension of Sentences                    16/40 B. Reading Commands                                  15/20 C. Written word-object choice matching         6/6 D. Written word-picture choice matching       6/6 E. Picture-written word choice matching        6/6 F. Spoken word-written choice matching       4/4 G. Letter discrimination                                   6/6 H. Spelled word recognition                            0/6 I.  Spelling                                                        4/6 Reading Total  59/100   Writing:                         A. Writing upon request                                  4/6                         B. Writing output                                             10/34                         C. Writing to dictation                                      8/10                         D. Writing dictated words                                8/10                         E. Alphabet and numbers                               13/22.5                         F. Dictated Letters and numbers                    4.5/7.5                          G. Copying a sentence                                   10/10                         Writing Total:                                                  57.5/100    PATIENT EDUCATION: Education details: role of SLP, results of assessment, SLP POC Person educated: Patient and Child(ren) Education method: Explanation Education comprehension: verbalized understanding and needs further education  HOME EXERCISE PROGRAM:   To be given in upcoming sessoin    GOALS:  Goals reviewed with patient? Yes  SHORT TERM GOALS: Target date: 10 sessions  Pt will complete PROM regarding current communication status. Baseline: Goal status: INITIAL  2.  Pt will participate in further assessment of functional reading and writing  with additional goals added as appropriate. Baseline:  Goal status: INITIAL  3.  Pt will complete moderately complex auditory comprehension tasks with >80% accuracy and cues as needed.  Baseline:  Goal status: INITIAL  4.  Pt will utilize compensatory strategies for anomia with min/mod cueing during structured tasks. Baseline:  Goal status: INITIAL   LONG TERM GOALS: Target date: 12 weeks, 04/26/23  Patient will demonstrate knowledge of appropriate activities to support functional receptive and expressive language outside of ST with assistance from  family.  Baseline:  Goal status: INITIAL  2.  Patient and/or family will report use of strategies outside of ST to improve communication (use of scripts, pre-planning, semantic feature analysis, supported conversation).  Baseline:  Goal status: INITIAL  3.  Patient will demonstrate improved functional communication as measured by Communication Effectiveness Survey.  Baseline:  Goal status: INITIAL    ASSESSMENT:  CLINICAL IMPRESSION: Patient is a 63 y.o. female who was seen today for aphasia treatment in setting of stroke with residual deficits. Pt had large L SAH and craniotomy in May 2023, pt s/p embolization of R ICA aneurysm and coil embolization of L MCA aneursym in December 2023. Assessment completed via functional/dynamic assessment and Western Aphasia Battery - Revised.  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 Noted impaired auditory comprehension for moderately complex-complex information as well as paucity of speech, anomia, and phonemic and semantic paraphasias. Pt with s/sx concomitant apraxia of speech. Pt reports good reading comprehension, but difficulty writing. Further functional dynamic assessment of reading and writing to be completed in upcoming sessions as well as completion of PROM. Pt is highly motivated to improve her communication and appears to have a very supportive daughter with whom she resides to provide needed support. See details of tx session above. Recommend course of ST to address functional communication in home and social settings.  OBJECTIVE IMPAIRMENTS include attention, expressive language, receptive language, aphasia, and apraxia. These impairments are limiting patient from ADLs/IADLs and effectively communicating at home and in community. Factors affecting potential to achieve goals and functional outcome are  time post-onset . Patient will benefit from skilled SLP services to address above impairments and improve overall  function.  REHAB POTENTIAL: Good  PLAN: SLP FREQUENCY: 1-2x/week  SLP DURATION: 12 weeks  PLANNED INTERVENTIONS: Language facilitation, Environmental controls, Cueing hierachy, Internal/external aids, Functional tasks, Multimodal communication approach, SLP instruction and feedback, Compensatory strategies, and Patient/family education    Clyde Canterbury, M.S., CCC-SLP Speech-Language Pathologist [facility] [phone] Arnette Felts)  [facility] at [facility] [address] [place], [place], [address] Phone: [phone]   Fax:  [phone]

## 2023-02-09 NOTE — Addendum Note (Signed)
Addended by: Golden Pop on: 02/09/2023 09:34 AM   Modules accepted: Orders

## 2023-02-13 ENCOUNTER — Ambulatory Visit: Payer: Medicare (Managed Care)

## 2023-02-13 ENCOUNTER — Ambulatory Visit: Payer: Medicare (Managed Care) | Admitting: Physical Therapy

## 2023-02-15 ENCOUNTER — Ambulatory Visit: Payer: Medicare (Managed Care) | Admitting: Physical Therapy

## 2023-02-15 ENCOUNTER — Ambulatory Visit: Payer: Medicare (Managed Care) | Admitting: Occupational Therapy

## 2023-02-15 ENCOUNTER — Ambulatory Visit: Payer: Medicare (Managed Care)

## 2023-02-20 ENCOUNTER — Ambulatory Visit: Payer: Medicare (Managed Care)

## 2023-02-20 ENCOUNTER — Ambulatory Visit: Payer: Medicare (Managed Care) | Admitting: Occupational Therapy

## 2023-02-23 ENCOUNTER — Ambulatory Visit: Payer: Medicare (Managed Care) | Admitting: Physical Therapy

## 2023-02-23 ENCOUNTER — Ambulatory Visit: Payer: Medicare (Managed Care)

## 2023-02-23 ENCOUNTER — Ambulatory Visit: Payer: Medicare (Managed Care) | Admitting: Occupational Therapy

## 2023-02-27 ENCOUNTER — Ambulatory Visit: Payer: Medicare (Managed Care) | Admitting: Physical Therapy

## 2023-02-27 ENCOUNTER — Ambulatory Visit: Payer: Medicare (Managed Care)

## 2023-02-27 ENCOUNTER — Ambulatory Visit: Payer: Medicare (Managed Care) | Admitting: Occupational Therapy

## 2023-03-01 ENCOUNTER — Ambulatory Visit: Payer: Self-pay | Admitting: Physical Therapy

## 2023-03-01 ENCOUNTER — Encounter: Payer: Self-pay | Admitting: Occupational Therapy

## 2023-03-07 ENCOUNTER — Ambulatory Visit: Payer: Self-pay | Admitting: Physical Therapy

## 2023-03-07 ENCOUNTER — Encounter: Payer: Self-pay | Admitting: Occupational Therapy

## 2023-03-09 ENCOUNTER — Ambulatory Visit: Payer: Self-pay | Admitting: Physical Therapy

## 2023-03-14 ENCOUNTER — Ambulatory Visit: Payer: Self-pay | Admitting: Physical Therapy

## 2023-03-14 ENCOUNTER — Encounter: Payer: Self-pay | Admitting: Occupational Therapy

## 2023-03-16 ENCOUNTER — Ambulatory Visit: Payer: Self-pay | Admitting: Physical Therapy

## 2023-03-16 ENCOUNTER — Encounter: Payer: Self-pay | Admitting: Occupational Therapy

## 2023-03-21 ENCOUNTER — Encounter: Payer: Self-pay | Admitting: Occupational Therapy

## 2023-03-21 ENCOUNTER — Ambulatory Visit: Payer: Self-pay

## 2023-03-23 ENCOUNTER — Encounter: Payer: Self-pay | Admitting: Occupational Therapy

## 2023-03-23 ENCOUNTER — Ambulatory Visit: Payer: Self-pay | Admitting: Physical Therapy

## 2023-03-28 ENCOUNTER — Encounter: Payer: Self-pay | Admitting: Occupational Therapy

## 2023-03-28 ENCOUNTER — Ambulatory Visit: Payer: Self-pay | Admitting: Physical Therapy

## 2023-03-30 ENCOUNTER — Ambulatory Visit: Payer: Self-pay | Admitting: Physical Therapy

## 2023-03-30 ENCOUNTER — Encounter: Payer: Self-pay | Admitting: Occupational Therapy

## 2023-04-04 ENCOUNTER — Ambulatory Visit: Payer: Self-pay | Admitting: Physical Therapy

## 2023-04-04 ENCOUNTER — Encounter: Payer: Self-pay | Admitting: Occupational Therapy

## 2023-04-06 ENCOUNTER — Ambulatory Visit: Payer: Self-pay | Admitting: Physical Therapy

## 2023-04-06 ENCOUNTER — Encounter: Payer: Self-pay | Admitting: Occupational Therapy

## 2023-04-11 ENCOUNTER — Encounter: Payer: Self-pay | Admitting: Occupational Therapy

## 2023-04-11 ENCOUNTER — Ambulatory Visit: Payer: Self-pay | Admitting: Physical Therapy

## 2023-04-13 ENCOUNTER — Encounter: Payer: Self-pay | Admitting: Occupational Therapy

## 2023-04-18 ENCOUNTER — Ambulatory Visit: Payer: Self-pay | Admitting: Physical Therapy

## 2023-04-18 ENCOUNTER — Encounter: Payer: Self-pay | Admitting: Occupational Therapy

## 2023-04-20 ENCOUNTER — Ambulatory Visit: Payer: Self-pay | Admitting: Physical Therapy

## 2023-04-20 ENCOUNTER — Encounter: Payer: Self-pay | Admitting: Occupational Therapy

## 2023-04-25 ENCOUNTER — Ambulatory Visit: Payer: Self-pay | Admitting: Physical Therapy

## 2023-04-25 ENCOUNTER — Encounter: Payer: Self-pay | Admitting: Occupational Therapy

## 2023-04-27 ENCOUNTER — Ambulatory Visit: Payer: Self-pay | Admitting: Physical Therapy

## 2023-04-27 ENCOUNTER — Encounter: Payer: Self-pay | Admitting: Occupational Therapy

## 2023-05-02 ENCOUNTER — Encounter: Payer: Self-pay | Admitting: Occupational Therapy

## 2023-05-02 ENCOUNTER — Ambulatory Visit: Payer: Self-pay | Admitting: Physical Therapy

## 2023-05-04 ENCOUNTER — Ambulatory Visit: Payer: Self-pay | Admitting: Physical Therapy

## 2023-05-04 ENCOUNTER — Encounter: Payer: Self-pay | Admitting: Occupational Therapy

## 2023-05-09 ENCOUNTER — Ambulatory Visit: Payer: Self-pay | Admitting: Physical Therapy

## 2023-05-09 ENCOUNTER — Encounter: Payer: Self-pay | Admitting: Occupational Therapy

## 2023-05-11 ENCOUNTER — Ambulatory Visit: Payer: Self-pay | Admitting: Physical Therapy

## 2023-05-11 ENCOUNTER — Encounter: Payer: Self-pay | Admitting: Occupational Therapy

## 2023-05-16 ENCOUNTER — Encounter: Payer: Self-pay | Admitting: Occupational Therapy

## 2023-05-16 ENCOUNTER — Ambulatory Visit: Payer: Self-pay | Admitting: Physical Therapy

## 2023-05-18 ENCOUNTER — Ambulatory Visit: Payer: Self-pay | Admitting: Physical Therapy

## 2023-05-18 ENCOUNTER — Encounter: Payer: Self-pay | Admitting: Occupational Therapy

## 2023-05-23 ENCOUNTER — Ambulatory Visit: Payer: Self-pay | Admitting: Physical Therapy

## 2023-05-23 ENCOUNTER — Encounter: Payer: Self-pay | Admitting: Occupational Therapy

## 2023-05-25 ENCOUNTER — Encounter: Payer: Self-pay | Admitting: Occupational Therapy

## 2023-05-25 ENCOUNTER — Ambulatory Visit: Payer: Self-pay | Admitting: Physical Therapy

## 2023-05-30 ENCOUNTER — Ambulatory Visit: Payer: Self-pay | Admitting: Physical Therapy

## 2023-05-30 ENCOUNTER — Encounter: Payer: Self-pay | Admitting: Occupational Therapy

## 2023-06-01 ENCOUNTER — Ambulatory Visit: Payer: Self-pay | Admitting: Physical Therapy

## 2023-06-01 ENCOUNTER — Encounter: Payer: Self-pay | Admitting: Occupational Therapy

## 2023-06-06 ENCOUNTER — Ambulatory Visit: Payer: Self-pay | Admitting: Physical Therapy

## 2023-06-06 ENCOUNTER — Encounter: Payer: Self-pay | Admitting: Occupational Therapy

## 2023-06-08 ENCOUNTER — Ambulatory Visit: Payer: Self-pay | Admitting: Physical Therapy

## 2023-06-08 ENCOUNTER — Encounter: Payer: Self-pay | Admitting: Occupational Therapy

## 2023-06-13 ENCOUNTER — Ambulatory Visit: Payer: Medicare (Managed Care) | Admitting: Physical Therapy

## 2023-06-13 ENCOUNTER — Encounter: Payer: Medicare (Managed Care) | Admitting: Occupational Therapy

## 2023-06-15 ENCOUNTER — Encounter: Payer: Medicare (Managed Care) | Admitting: Occupational Therapy

## 2023-06-15 ENCOUNTER — Ambulatory Visit: Payer: Medicare (Managed Care) | Admitting: Physical Therapy

## 2023-06-20 ENCOUNTER — Encounter: Payer: Medicare (Managed Care) | Admitting: Occupational Therapy

## 2023-06-20 ENCOUNTER — Ambulatory Visit: Payer: Medicare (Managed Care) | Admitting: Physical Therapy

## 2023-06-22 ENCOUNTER — Ambulatory Visit: Payer: Medicare (Managed Care) | Admitting: Physical Therapy

## 2023-06-22 ENCOUNTER — Encounter: Payer: Medicare (Managed Care) | Admitting: Occupational Therapy

## 2023-06-27 ENCOUNTER — Encounter: Payer: Medicare (Managed Care) | Admitting: Occupational Therapy

## 2023-06-27 ENCOUNTER — Ambulatory Visit: Payer: Medicare (Managed Care) | Admitting: Physical Therapy

## 2023-06-29 ENCOUNTER — Encounter: Payer: Medicare (Managed Care) | Admitting: Occupational Therapy

## 2023-06-29 ENCOUNTER — Ambulatory Visit: Payer: Medicare (Managed Care) | Admitting: Physical Therapy

## 2023-07-04 ENCOUNTER — Ambulatory Visit: Payer: Medicare (Managed Care) | Admitting: Physical Therapy

## 2023-07-04 ENCOUNTER — Encounter: Payer: Medicare (Managed Care) | Admitting: Occupational Therapy

## 2023-07-06 ENCOUNTER — Ambulatory Visit: Payer: Medicare (Managed Care) | Admitting: Physical Therapy

## 2023-07-06 ENCOUNTER — Encounter: Payer: Medicare (Managed Care) | Admitting: Occupational Therapy

## 2023-07-11 ENCOUNTER — Ambulatory Visit: Payer: Medicare (Managed Care) | Admitting: Physical Therapy

## 2023-07-11 ENCOUNTER — Encounter: Payer: Medicare (Managed Care) | Admitting: Occupational Therapy

## 2023-07-13 ENCOUNTER — Ambulatory Visit: Payer: Medicare (Managed Care) | Admitting: Physical Therapy

## 2023-07-13 ENCOUNTER — Encounter: Payer: Medicare (Managed Care) | Admitting: Occupational Therapy

## 2023-07-18 ENCOUNTER — Ambulatory Visit: Payer: Medicare (Managed Care) | Admitting: Physical Therapy

## 2023-07-18 ENCOUNTER — Encounter: Payer: Medicare (Managed Care) | Admitting: Occupational Therapy

## 2023-07-20 ENCOUNTER — Encounter: Payer: Medicare (Managed Care) | Admitting: Occupational Therapy

## 2023-07-20 ENCOUNTER — Ambulatory Visit: Payer: Medicare (Managed Care) | Admitting: Physical Therapy

## 2023-07-25 ENCOUNTER — Ambulatory Visit: Payer: Medicare (Managed Care) | Admitting: Physical Therapy

## 2023-07-25 ENCOUNTER — Encounter: Payer: Medicare (Managed Care) | Admitting: Occupational Therapy

## 2023-07-27 ENCOUNTER — Ambulatory Visit: Payer: Medicare (Managed Care) | Admitting: Physical Therapy

## 2023-07-27 ENCOUNTER — Encounter: Payer: Medicare (Managed Care) | Admitting: Occupational Therapy

## 2023-08-01 ENCOUNTER — Encounter: Payer: Medicare (Managed Care) | Admitting: Occupational Therapy

## 2023-08-01 ENCOUNTER — Ambulatory Visit: Payer: Medicare (Managed Care) | Admitting: Physical Therapy

## 2023-08-03 ENCOUNTER — Encounter: Payer: Medicare (Managed Care) | Admitting: Occupational Therapy

## 2023-08-03 ENCOUNTER — Ambulatory Visit: Payer: Medicare (Managed Care) | Admitting: Physical Therapy

## 2023-08-08 ENCOUNTER — Encounter: Payer: Medicare (Managed Care) | Admitting: Occupational Therapy

## 2023-08-08 ENCOUNTER — Ambulatory Visit: Payer: Medicare (Managed Care) | Admitting: Physical Therapy

## 2023-08-10 ENCOUNTER — Encounter: Payer: Medicare (Managed Care) | Admitting: Occupational Therapy

## 2023-08-10 ENCOUNTER — Ambulatory Visit: Payer: Medicare (Managed Care) | Admitting: Physical Therapy

## 2023-08-15 ENCOUNTER — Ambulatory Visit: Payer: Medicare (Managed Care) | Admitting: Physical Therapy

## 2023-08-15 ENCOUNTER — Encounter: Payer: Medicare (Managed Care) | Admitting: Occupational Therapy

## 2023-08-17 ENCOUNTER — Ambulatory Visit: Payer: Medicare (Managed Care) | Admitting: Physical Therapy

## 2023-08-17 ENCOUNTER — Encounter: Payer: Medicare (Managed Care) | Admitting: Occupational Therapy

## 2023-11-30 ENCOUNTER — Other Ambulatory Visit (HOSPITAL_COMMUNITY): Payer: Self-pay | Admitting: Neurosurgery

## 2023-11-30 DIAGNOSIS — I671 Cerebral aneurysm, nonruptured: Secondary | ICD-10-CM

## 2024-02-10 IMAGING — DX DG CHEST 1V PORT
1 series · 1 of 1 positions shown · non-contrast
Comparison: Radiograph 06/20/2021

CLINICAL DATA: Shortness of breath, abdominal distension

EXAM:
PORTABLE CHEST 1 VIEW

[chest]
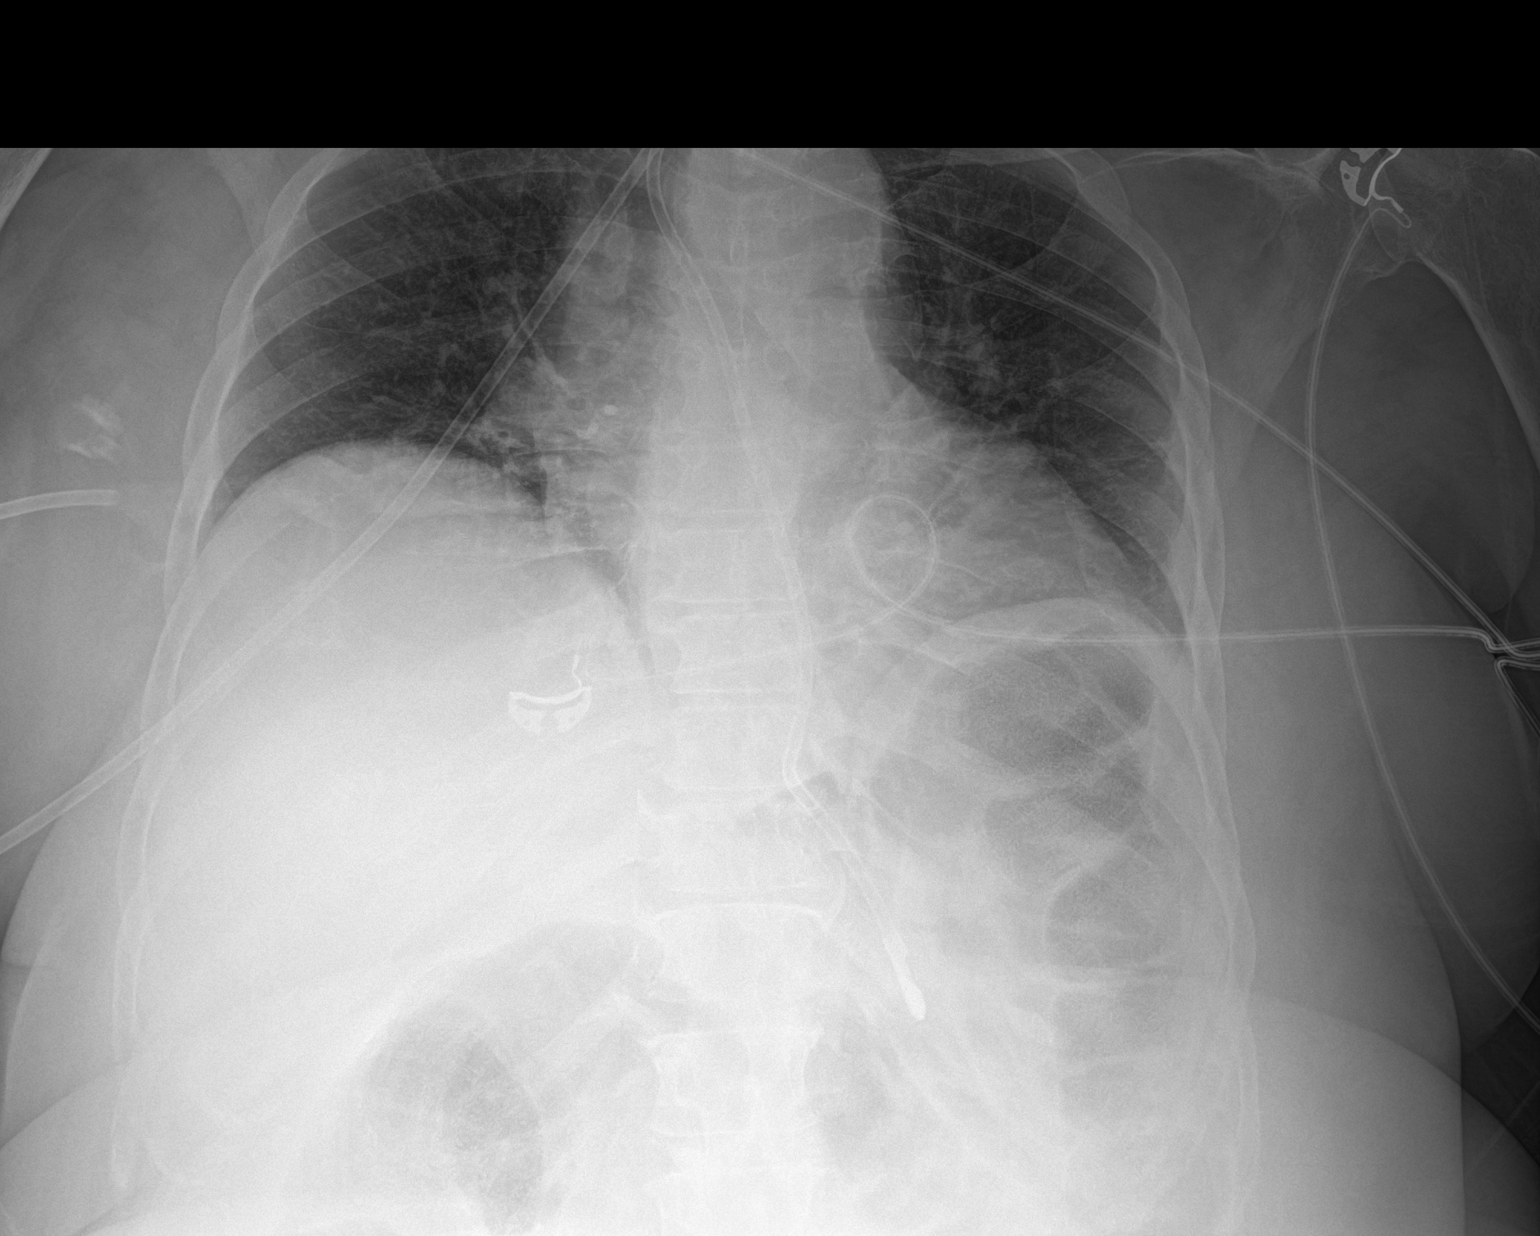

[1 of 1 positions shown; findings below may reference images not displayed]

FINDINGS: Unchanged cardiomediastinal silhouette. Low lung volumes. Left
medial basilar opacity. No large pleural effusion. No pneumothorax.
No acute osseous abnormality. There is there is a feeding tube with
tip overlying the stomach. There are multiple dilated loops of small
bowel and probably splenic flexure, as seen on separately dictated
abdominal radiograph.
IMPRESSION: Low lung volumes. Left medial basilar opacity, favored to be
atelectasis.

Dilated loops of bowel in the abdomen, as noted in separately
dictated abdominal radiograph.

## 2024-02-10 IMAGING — DX DG ABD PORTABLE 1V
1 series · 2 of 2 positions shown · non-contrast
Comparison: 06/22/2021

CLINICAL DATA: Abdominal distention, shortness of breath

EXAM:
PORTABLE ABDOMEN - 1 VIEW

[Series 1: abdomen · 0.14mm/px · 2 of 2 slices shown]
[im 1/2]
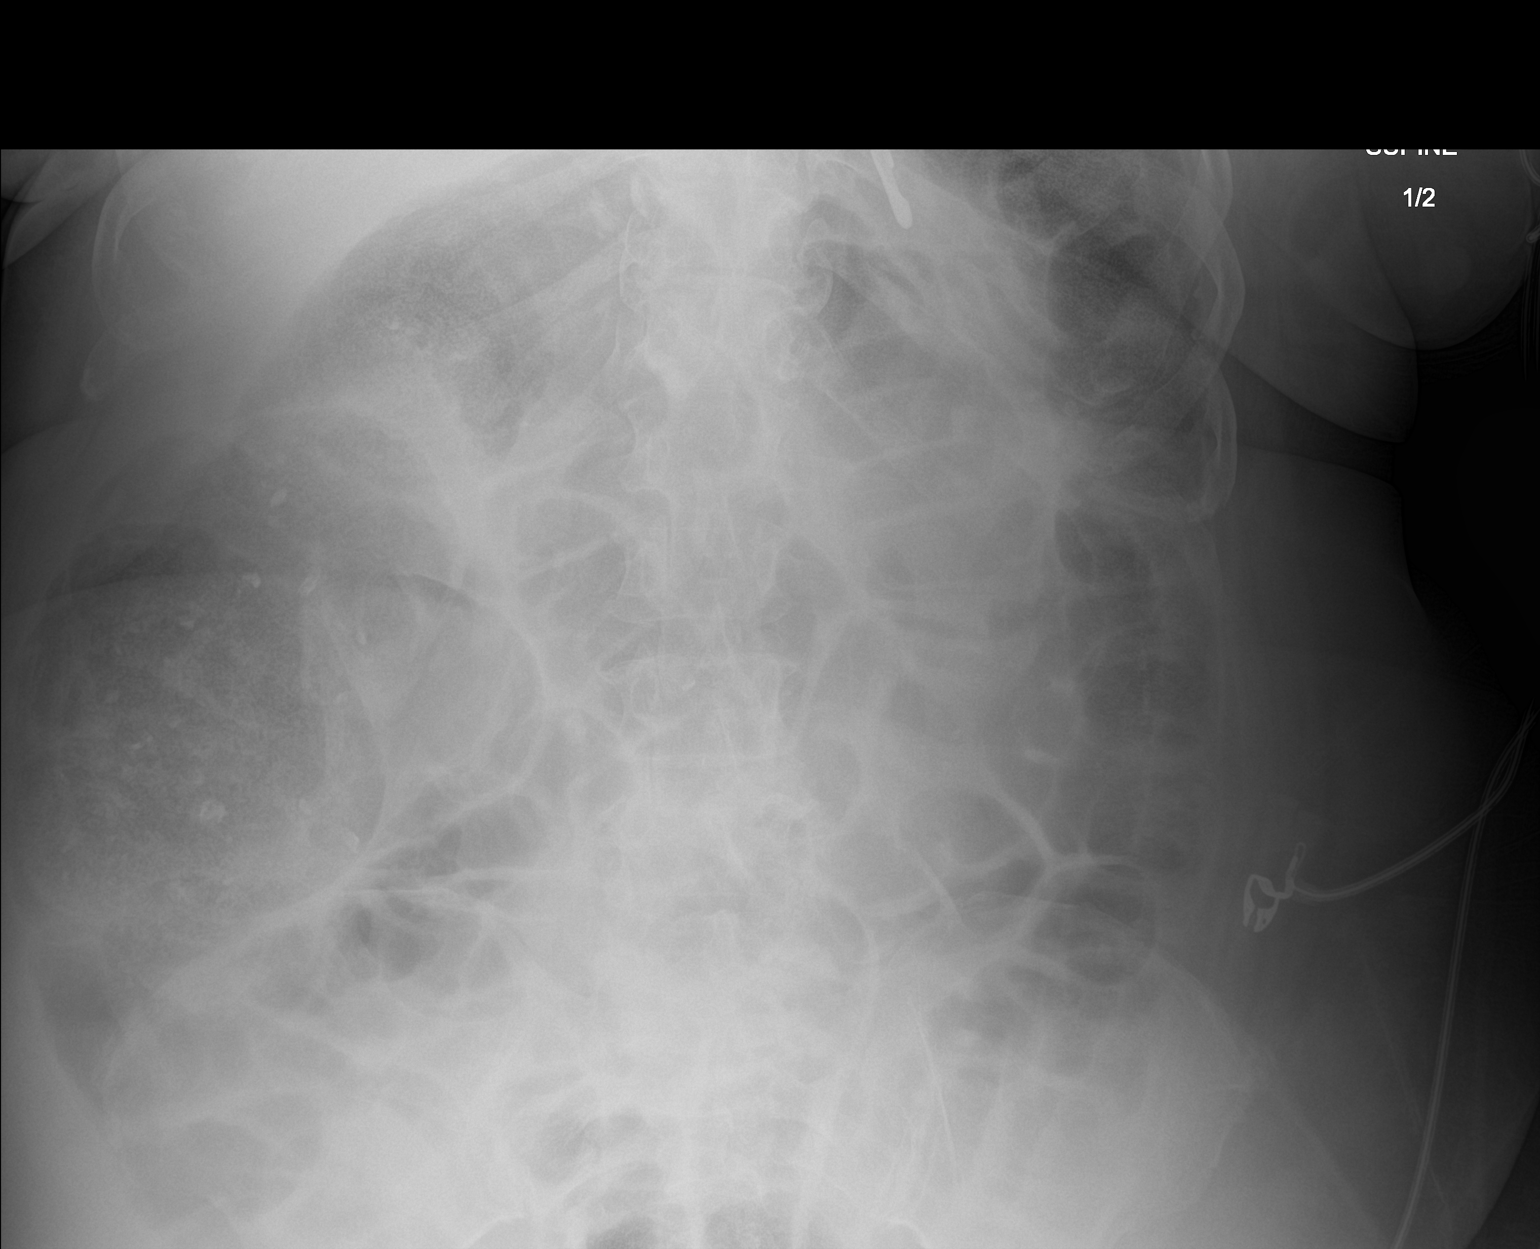
[im 2/2]
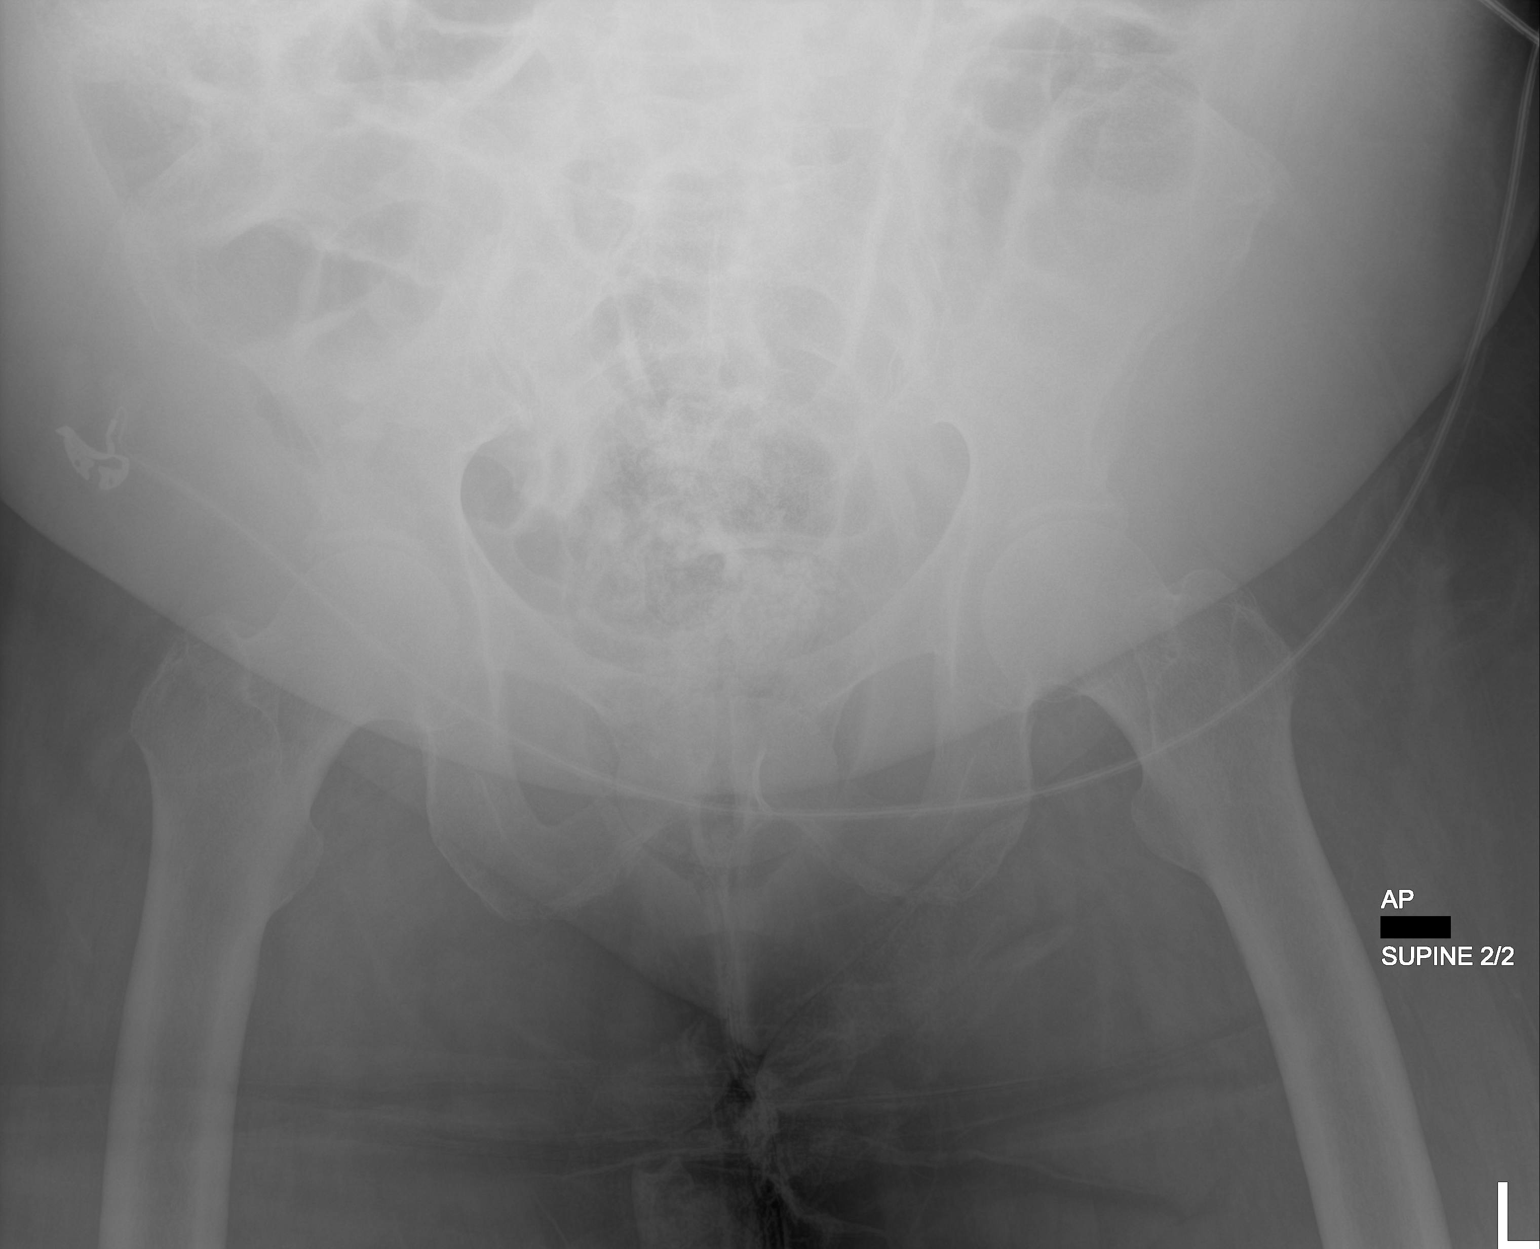

[2 of 2 positions shown; findings below may reference images not displayed]

FINDINGS: Tip of enteric tube is seen in the region of medial aspect of fundus
of the stomach. Distal course of enteric tube appears to be coiled.
There is dilation of small-bowel loops in the visualized portions of
abdomen measuring up to 5.1 cm in diameter. Stomach is not
distended. Gas and stool are present in colon. Pelvis is not
included in its entirety limiting evaluation of rectosigmoid region.
IMPRESSION: Tip of enteric tube is seen in the medial aspect of fundus of the
stomach.

There is dilation of small-bowel loops suggesting ileus or partial
obstruction.

## 2024-02-10 IMAGING — CT CT ABDOMEN W/O CM
3 of 4 series · 12 of 46 positions shown, 13 images · non-contrast
Comparison: None Available.

CLINICAL DATA: Nausea/vomiting Concern for SBO

EXAM:
CT ABDOMEN WITHOUT CONTRAST
TECHNIQUE: Multidetector CT imaging of the abdomen was performed following the
standard protocol without IV contrast.
RADIATION DOSE REDUCTION: This exam was performed according to the
departmental dose-optimization program which includes automated
exposure control, adjustment of the mA and/or kV according to
patient size and/or use of iterative reconstruction technique.

[Series 4: ap without · axial · non-contrast · 0.60mm/px · z∈[-1081,-831]mm · 7 of 68 slices shown, 8 images]
[im 9/68  soft-tissue]
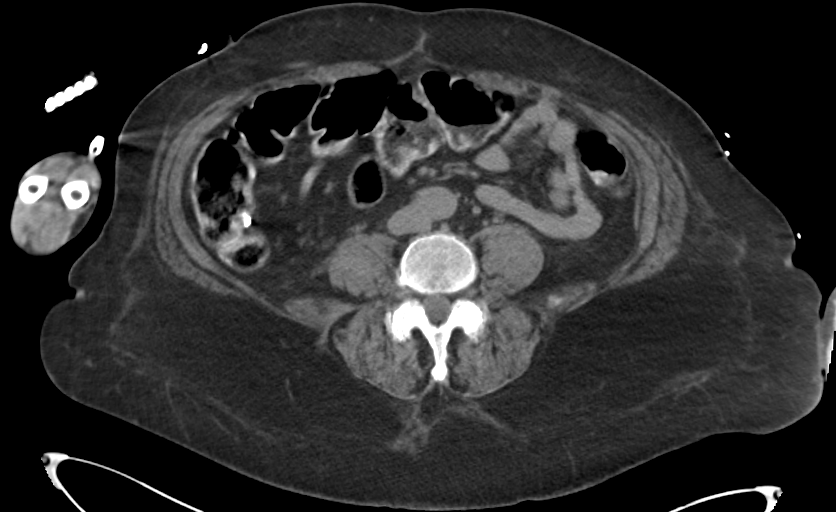
[im 9/68  bone]
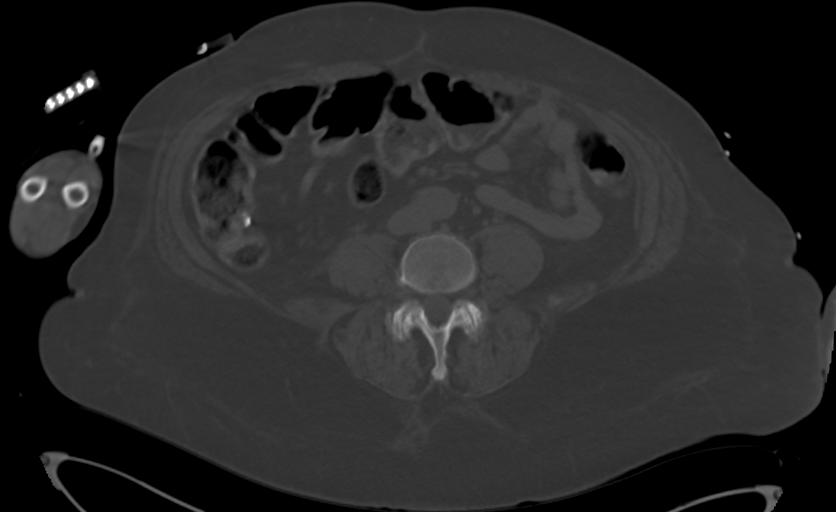
[im 17/68  soft-tissue]
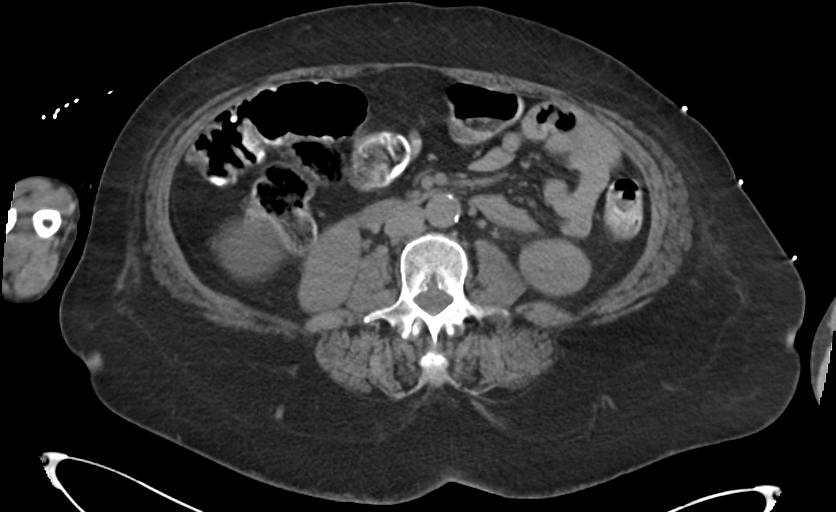
[im 26/68  soft-tissue]
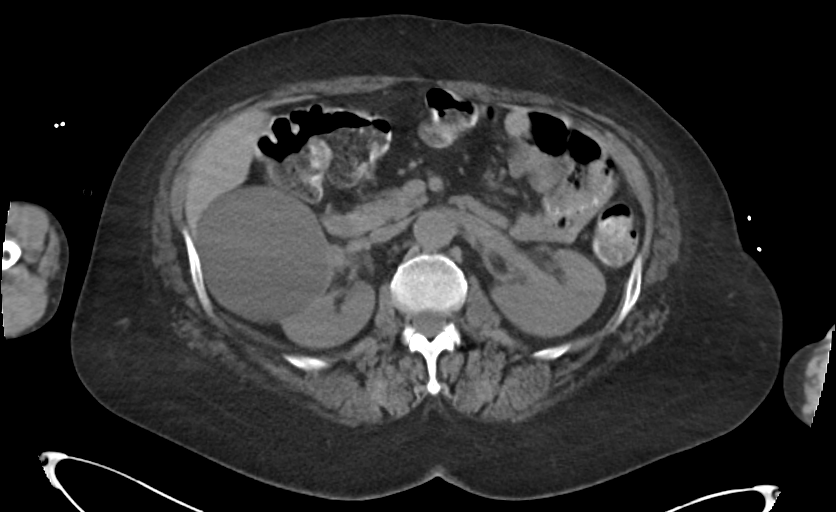
[im 34/68  soft-tissue]
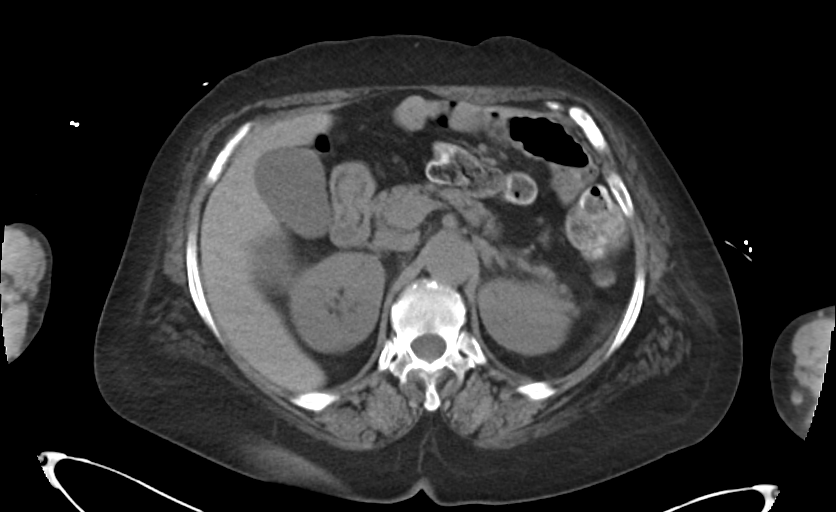
[im 42/68  soft-tissue]
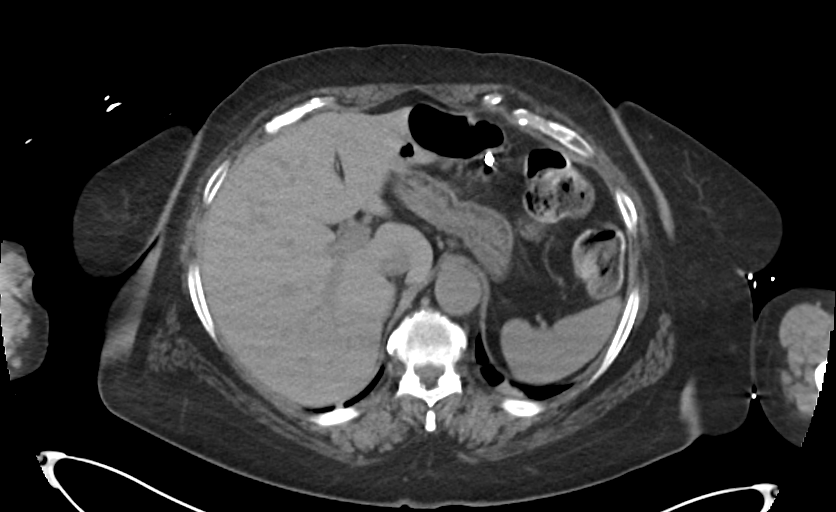
[im 51/68  soft-tissue]
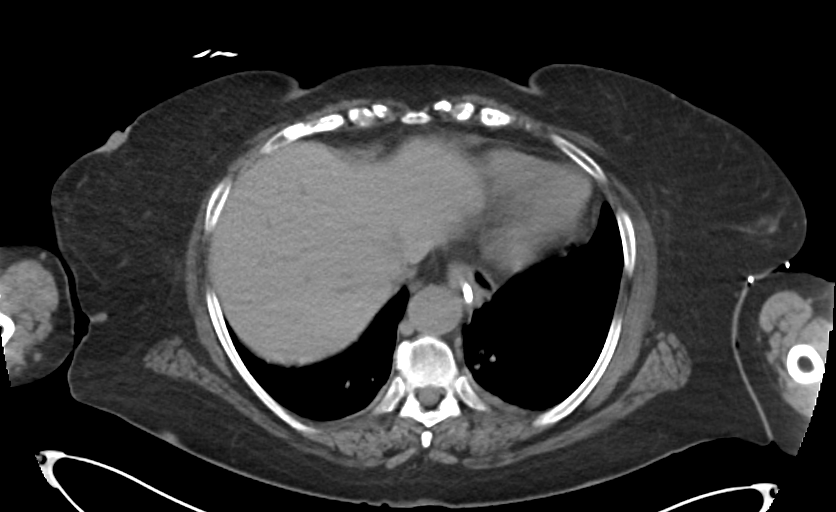
[im 59/68  soft-tissue]
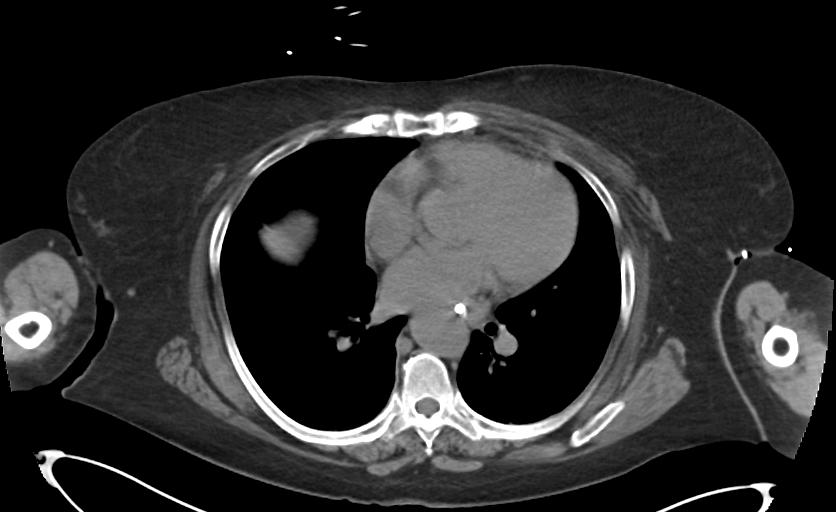

[Series 6: lungs · axial · 0.98mm/px · z∈[-1096,-1066]mm · 2 of 168 slices shown]
[im 16/168  soft-tissue]
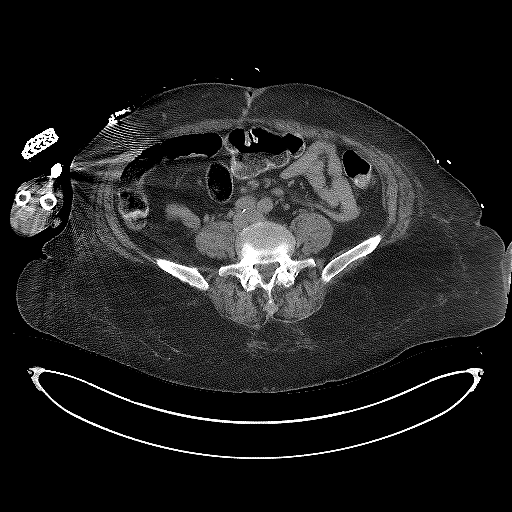
[im 31/168  soft-tissue]
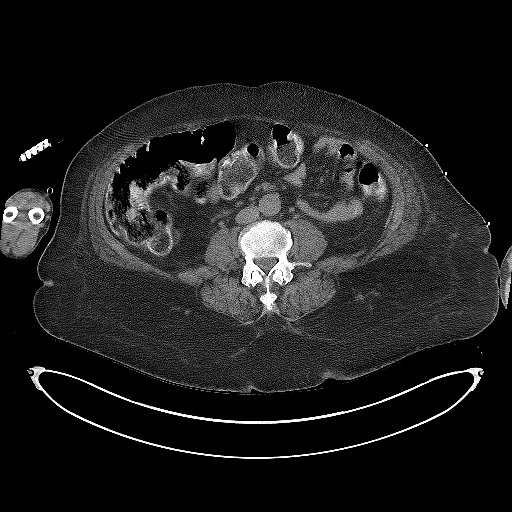

[Series 7: cor · coronal · 0.66mm/px · 3 of 103 slices shown]
[im 35/103  soft-tissue]
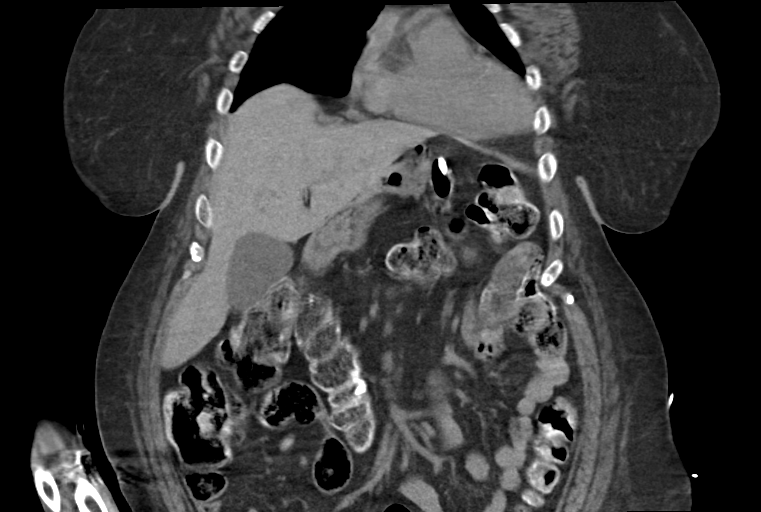
[im 46/103  soft-tissue]
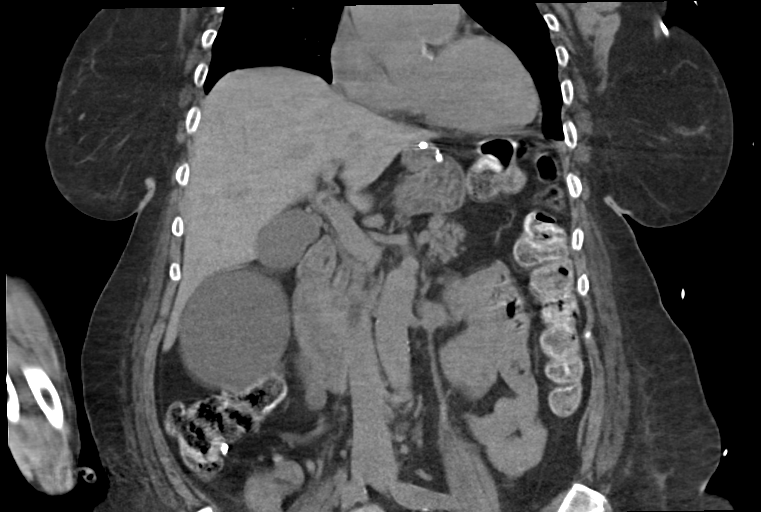
[im 57/103  soft-tissue]
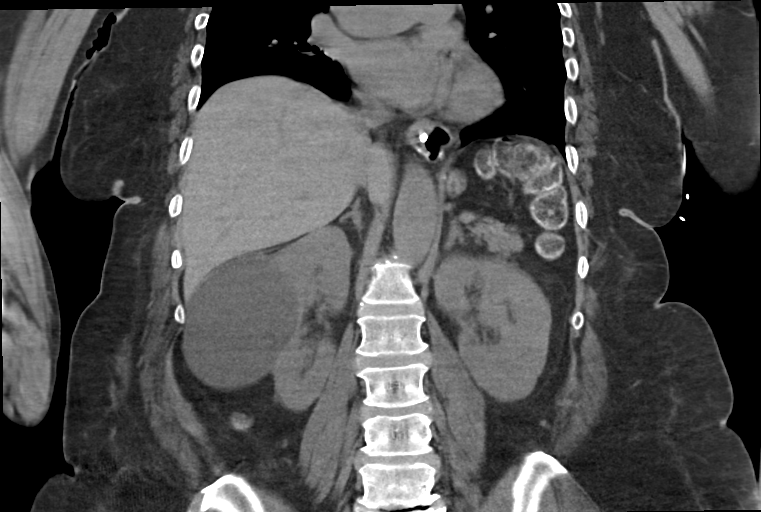

[12 of 46 positions shown; findings below may reference images not displayed]

FINDINGS: Lower chest: No pleural or pericardial effusion.

Hepatobiliary: No focal liver abnormality is seen. No gallstones,
gallbladder wall thickening, or biliary dilatation.

Pancreas: Unremarkable. No pancreatic ductal dilatation or
surrounding inflammatory changes.

Spleen: Normal in size without focal abnormality.

Adrenals/Urinary Tract: No adrenal mass. No urolithiasis or
hydronephrosis. 8.2 cm 6 HU exophytic cystic lesion from the mid
right kidney; no follow-up imaging recommended.

Stomach/Bowel: Weighted tip feeding tube tube extends into the
decompressed stomach. Changes of gastric bypass surgery. Visualized
small bowel decompressed. Visualized colon contains moderate fecal
material, without dilatation or wall thickening.

Vascular/Lymphatic: Mild aortoiliac calcified atheromatous plaque
without aneurysm. No abdominal adenopathy.

Other: No abdominal ascites.  No free air.

Musculoskeletal: Mild spondylitic change in the lower thoracic and
upper lumbar spine. No acute findings.
IMPRESSION: 1. No acute findings.  No evidence of small-bowel obstruction.
2.  Aortic Atherosclerosis ([id number]).
